# Patient Record
Sex: Female | Born: 1955
Health system: Southern US, Community
[De-identification: ages and names within clinical notes are randomized; demographics above are authoritative.]

## PROBLEM LIST (undated history)

## (undated) DIAGNOSIS — I10 Essential (primary) hypertension: Secondary | ICD-10-CM

## (undated) DIAGNOSIS — F32A Depression, unspecified: Secondary | ICD-10-CM

## (undated) DIAGNOSIS — M1990 Unspecified osteoarthritis, unspecified site: Secondary | ICD-10-CM

## (undated) DIAGNOSIS — F419 Anxiety disorder, unspecified: Secondary | ICD-10-CM

## (undated) DIAGNOSIS — G4733 Obstructive sleep apnea (adult) (pediatric): Secondary | ICD-10-CM

## (undated) DIAGNOSIS — J449 Chronic obstructive pulmonary disease, unspecified: Secondary | ICD-10-CM

## (undated) DIAGNOSIS — J302 Other seasonal allergic rhinitis: Secondary | ICD-10-CM

## (undated) DIAGNOSIS — K219 Gastro-esophageal reflux disease without esophagitis: Secondary | ICD-10-CM

## (undated) DIAGNOSIS — J45909 Unspecified asthma, uncomplicated: Secondary | ICD-10-CM

## (undated) HISTORY — DX: Unspecified asthma, uncomplicated: J45.909

## (undated) HISTORY — DX: Obstructive sleep apnea (adult) (pediatric): G47.33

## (undated) HISTORY — DX: Other seasonal allergic rhinitis: J30.2

## (undated) HISTORY — DX: Essential (primary) hypertension: I10

---

## 2001-05-20 ENCOUNTER — Ambulatory Visit (HOSPITAL_COMMUNITY): Admission: RE | Admit: 2001-05-20 | Discharge: 2001-05-20 | Payer: Self-pay | Admitting: Internal Medicine

## 2001-09-27 ENCOUNTER — Ambulatory Visit: Admission: RE | Admit: 2001-09-27 | Discharge: 2001-09-27 | Payer: Self-pay | Admitting: Family Medicine

## 2011-03-10 ENCOUNTER — Encounter: Payer: Self-pay | Admitting: Internal Medicine

## 2014-11-15 DIAGNOSIS — J3089 Other allergic rhinitis: Secondary | ICD-10-CM | POA: Insufficient documentation

## 2014-11-15 DIAGNOSIS — J309 Allergic rhinitis, unspecified: Secondary | ICD-10-CM

## 2014-11-23 ENCOUNTER — Encounter: Payer: Self-pay | Admitting: Allergy and Immunology

## 2014-11-23 ENCOUNTER — Encounter (INDEPENDENT_AMBULATORY_CARE_PROVIDER_SITE_OTHER): Payer: Self-pay

## 2014-11-23 ENCOUNTER — Ambulatory Visit (INDEPENDENT_AMBULATORY_CARE_PROVIDER_SITE_OTHER): Payer: 59 | Admitting: Internal Medicine

## 2014-11-23 ENCOUNTER — Encounter: Payer: Self-pay | Admitting: Internal Medicine

## 2014-11-23 ENCOUNTER — Ambulatory Visit (INDEPENDENT_AMBULATORY_CARE_PROVIDER_SITE_OTHER): Payer: 59 | Admitting: Allergy and Immunology

## 2014-11-23 VITALS — BP 138/80 | HR 76 | Resp 16

## 2014-11-23 VITALS — BP 164/70 | HR 87 | Ht 62.5 in | Wt 207.8 lb

## 2014-11-23 DIAGNOSIS — R058 Other specified cough: Secondary | ICD-10-CM | POA: Insufficient documentation

## 2014-11-23 DIAGNOSIS — Z87891 Personal history of nicotine dependence: Secondary | ICD-10-CM

## 2014-11-23 DIAGNOSIS — R0689 Other abnormalities of breathing: Secondary | ICD-10-CM | POA: Diagnosis not present

## 2014-11-23 DIAGNOSIS — J4540 Moderate persistent asthma, uncomplicated: Secondary | ICD-10-CM | POA: Diagnosis not present

## 2014-11-23 DIAGNOSIS — R0600 Dyspnea, unspecified: Secondary | ICD-10-CM | POA: Diagnosis not present

## 2014-11-23 DIAGNOSIS — R05 Cough: Secondary | ICD-10-CM

## 2014-11-23 DIAGNOSIS — Z720 Tobacco use: Secondary | ICD-10-CM | POA: Diagnosis not present

## 2014-11-23 DIAGNOSIS — J309 Allergic rhinitis, unspecified: Secondary | ICD-10-CM

## 2014-11-23 LAB — NITRIC OXIDE: NITRIC OXIDE: 17

## 2014-11-23 MED ORDER — MOMETASONE FURO-FORMOTEROL FUM 200-5 MCG/ACT IN AERO: 2.0000 | INHALATION_SPRAY | Freq: Two times a day (BID) | RESPIRATORY_TRACT | Status: DC

## 2014-11-23 MED ORDER — IPRATROPIUM BROMIDE 0.02 % IN SOLN
0.5000 mg | Freq: Once | RESPIRATORY_TRACT | Status: AC
  Administered 2014-11-23: 0.5 mg via RESPIRATORY_TRACT

## 2014-11-23 MED ORDER — LEVALBUTEROL HCL 1.25 MG/0.5ML IN NEBU
1.2500 mg | INHALATION_SOLUTION | Freq: Once | RESPIRATORY_TRACT | Status: AC
  Administered 2014-11-23: 1.25 mg via RESPIRATORY_TRACT

## 2014-11-23 MED ORDER — AZELASTINE-FLUTICASONE 137-50 MCG/ACT NA SUSP: 1.0000 | Freq: Two times a day (BID) | NASAL | Status: DC

## 2014-11-23 NOTE — Progress Notes (Deleted)
Dear ***:  I had the pleasure of seeing Alicia Hall in initial evaluation today.  As you recall {he/she/they:23295} is 59 y.o.  and presents with ***.  After review of {Desc; his/her:32168} history, examination and testing my impression and recommendations are:  Assessment and plan: Problem List Items Addressed This Visit      Respiratory   Allergic rhinitis - Primary   Relevant Medications   Azelastine-Fluticasone (DYMISTA) 137-50 MCG/ACT SUSP   Moderate persistent asthma   Relevant Medications   mometasone-formoterol (DULERA) 200-5 MCG/ACT AERO   levalbuterol (XOPENEX) nebulizer solution 1.25 mg (Completed)   ipratropium (ATROVENT) nebulizer solution 0.5 mg (Completed)   Other Relevant Orders   Spirometry with Graph      History of present illness: No problems updated.  Review of systems: ROS  Past medical history: Past Medical History  Diagnosis Date  . Hypertension   . Asthma   . Seasonal allergies   . OSA (obstructive sleep apnea)     on CPAP    Past surgical history: No past surgical history on file.  Family history: Family History  Problem Relation Age of Onset  . Emphysema Father   . Emphysema Mother   . Lung cancer Maternal Grandfather   . Heart attack Maternal Grandmother     Social history:  Social History   Social History  . Marital Status: Married    Spouse Name: N/A  . Number of Children: N/A  . Years of Education: N/A   Occupational History  . house wife    Social History Main Topics  . Smoking status: Former Smoker -- 1.50 packs/day for 25 years    Types: Cigarettes  . Smokeless tobacco: Not on file  . Alcohol Use: 0.0 oz/week    0 Standard drinks or equivalent per week     Comment: rarely  . Drug Use: No  . Sexual Activity: Not on file   Other Topics Concern  . Not on file   Social History Narrative    Environmental History: {environmental history:[phone]}   Known medication allergies: Allergies  Allergen  Reactions  . Lisinopril Cough    Outpatient medications:   Medication List       This list is accurate as of: 11/23/14  4:25 PM.  Always use your most recent med list.               amLODipine 10 MG tablet  Commonly known as:  NORVASC  Take 10 mg by mouth daily.     Azelastine-Fluticasone 137-50 MCG/ACT Susp  Commonly known as:  DYMISTA  Place 1 spray into the nose 2 (two) times daily.     cetirizine 10 MG tablet  Commonly known as:  ZYRTEC  Take 10 mg by mouth daily.     Fish Oil 1000 MG Caps  Take 2 capsules by mouth daily.     losartan-hydrochlorothiazide 100-12.5 MG tablet  Commonly known as:  HYZAAR  Take 1 tablet by mouth daily.     mometasone-formoterol 200-5 MCG/ACT Aero  Commonly known as:  DULERA  Inhale 2 puffs into the lungs 2 (two) times daily at 10 AM and 5 PM.     montelukast 10 MG tablet  Commonly known as:  SINGULAIR  Take 10 mg by mouth daily.     multivitamin with minerals tablet  Take 1 tablet by mouth daily.     norethindrone 5 MG tablet  Commonly known as:  AYGESTIN  Take 2.5 mg by mouth daily.  omeprazole 20 MG capsule  Commonly known as:  PRILOSEC  Take 20 mg by mouth 2 (two) times daily before a meal.     ranitidine 150 MG tablet  Commonly known as:  ZANTAC  Take 150 mg by mouth 2 (two) times daily.     Red Yeast Rice 600 MG Caps  Take 2 capsules by mouth daily.     sertraline 100 MG tablet  Commonly known as:  ZOLOFT  Take 100 mg by mouth daily.     SPIRIVA HANDIHALER IN  Inhale 1 puff into the lungs daily.     VENTOLIN HFA IN  Inhale into the lungs.     albuterol (2.5 MG/3ML) 0.083% nebulizer solution  Commonly known as:  PROVENTIL  Take 2.5 mg by nebulization every 6 (six) hours as needed for wheezing or shortness of breath.        Physical examination: Blood pressure 138/80, pulse 76, resp. rate 16, SpO2 95 %.  Physical Exam    Diagnostics: {Allergy studies:[phone]}     Shadiamond will ***.  Additional recommendations will be made at follow-up.  Thank you very much for this referral.   Please contact me at your earliest convenience with any questions or concerns.  Sincerely,   Roselyn M. Ishmael Holter, MD

## 2014-11-23 NOTE — Addendum Note (Signed)
Addended by: Maurice March on: 11/23/2014 05:30 PM   Modules accepted: Orders

## 2014-11-23 NOTE — Patient Instructions (Signed)
ICD-9-CM ICD-10-CM   1. Dyspnea and respiratory abnormality 786.09 R06.00     R06.89   2. Smoking history V15.82 Z72.0   3. Upper airway cough syndrome 786.2 R05    Do HRCT chest Do full PFT Refer ENT rule out upper airway problems  followup  - after completing all of above -  - til then continue meds without change

## 2014-11-23 NOTE — Progress Notes (Signed)
History of present illness: Clarabell returns to the office in follow-up of recent cough and wheeze.  She reports she has improved from visit on (11/06/2014) but not 100%.  She has noted occasional cough with wheeze and when active shortness of breath.  She is using albuterol a few times a day but much less since completing Prednisone.  Her sleep is otherwise is okay but occasionally has noted hoarseness without sore throat, fever or headache.  She feels Spiriva is helpful and interested in what Pulmonology will say as her appointment is today.  Outpatient medications:   Medication List       This list is accurate as of: 11/23/14  4:29 PM.  Always use your most recent med list.               amLODipine 10 MG tablet  Commonly known as:  NORVASC  Take 10 mg by mouth daily.     Azelastine-Fluticasone 137-50 MCG/ACT Susp  Commonly known as:  DYMISTA  Place 1 spray into the nose 2 (two) times daily.     cetirizine 10 MG tablet  Commonly known as:  ZYRTEC  Take 10 mg by mouth daily.     Fish Oil 1000 MG Caps  Take 2 capsules by mouth daily.     losartan-hydrochlorothiazide 100-12.5 MG tablet  Commonly known as:  HYZAAR  Take 1 tablet by mouth daily.     mometasone-formoterol 200-5 MCG/ACT Aero  Commonly known as:  DULERA  Inhale 2 puffs into the lungs 2 (two) times daily at 10 AM and 5 PM.     montelukast 10 MG tablet  Commonly known as:  SINGULAIR  Take 10 mg by mouth daily.     multivitamin with minerals tablet  Take 1 tablet by mouth daily.     norethindrone 5 MG tablet  Commonly known as:  AYGESTIN  Take 2.5 mg by mouth daily.     omeprazole 20 MG capsule  Commonly known as:  PRILOSEC  Take 20 mg by mouth 2 (two) times daily before a meal.     ranitidine 150 MG tablet  Commonly known as:  ZANTAC  Take 150 mg by mouth 2 (two) times daily.     Red Yeast Rice 600 MG Caps  Take 2 capsules by mouth daily.     sertraline 100 MG tablet  Commonly known as:  ZOLOFT  Take  100 mg by mouth daily.     SPIRIVA HANDIHALER IN  Inhale 1 puff into the lungs daily.     VENTOLIN HFA IN  Inhale into the lungs.     albuterol (2.5 MG/3ML) 0.083% nebulizer solution  Commonly known as:  PROVENTIL  Take 2.5 mg by nebulization every 6 (six) hours as needed for wheezing or shortness of breath.        Known medication allergies: Allergies  Allergen Reactions  . Lisinopril Cough    Physical examination: Blood pressure 138/80, pulse 76, resp. rate 16, SpO2 95 %.  General: Alert, interactive, in no acute distress. HEENT: TMs pearly gray, normal nasal mucosa, post-pharynx without erythema, exudate or lesions. Neck: Supple without lymphadenopath Lungs: Scattered wheeze without rhonchi or crackles; post Xopenex/Atrovent neb=significant improvement with clear breath sound throughout--patient reports improved. CV: Normal S1, S2 without murmurs. Skin: Warm and dry, without lesions or rashes.  Diagnostics:  FVC 1.81--66%; FEV1--1.06--47.  Postbronchodilator improvement------  FVC 1.81--66%, FEV1 1.15--51%.     Assessment: 1.  History of Asthma with recent exacerbation with probable component  of COPD. 2.  Allergic Rhinitis, improved control. 3.  Recent completion of Prednisone with partial improvement. 4.  Upcoming Pulmonary appointment.   Plan: 1.  Ammi indicates that she has appointment with Dr. Chase Caller today and therefore will defer additional management to his office given her improvement in the last 2 weeks s/p Prednisone and maintenance Dulera and Spiriva. 2. Continue Dymista, Singulair and Cetirizine as previously. 3. Follow-up in 6 months or sooner if needed.    Roselyn M. Ishmael Holter, MD

## 2014-11-23 NOTE — Progress Notes (Signed)
Subjective:    Patient ID: Alicia Hall, female    DOB: 14-Jun-1955, 59 y.o.   MRN: [id number]  PCP .Gar Ponto, MD  HPI  IOV 11/23/2014  Chief Complaint  Patient presents with  . pulmonary consult    pt. states she has SOB. chest sore at times. pt. denies chest tightness, prod. cough clear in color.   59 year old follow-up heavy smoker referred by Dr. Ishmael Holter. History is provided by the patient and review of the outside chart. As best as I can gather patient has a long-standing history of chronic cough, wheezing, shortness of breath. Shortness of breath is the dominant symptom. This is been going on for several years. According to the patient's brother 2 years ago she was extremely sick with the symptoms that family thought she might die. Year 2015 was about a year. But again in the year 2016 symptoms have persisted and are getting worse slowly. The particular he was in the last several months. A year ago she started seeing Dr. Ishmael Holter. According to the patient's history Symbicort was changed to [facility] a year ago but did not help. Then somewhere along the way Spiriva was added. But she still has symptoms. She describes symptoms getting worse with heat and humidity and with exertion but also relieved by rest and by albuterol nebulizers. There is one outside note 09/13/2014 from Dr. Ishmael Holter that suggest that the patient history is likely accurate.  Currently for the last few months have asthma control questionnaire shows significant amount of symptoms. At night she wakes up a few times. On waking up in the morning she is quite severe symptoms which she thinks is because of asthma. She is also very limited in her activities because of asthma. She describes a great deal of shortness of breath because of asthma. And is wheezing most of the time because of asthma and is using albuterol nebulizer for rescue at least 4 times a day. This brings to 5. question an average score to 4 that shows high activity  of symptoms or poor control if she indeed has asthma  Outside notes from Dr. Ishmael Holter  indicate somewhat of a restrictive spirometry with FEV1 1.4 L/62% FVC 1.88 L/68% with a ratio of 91%.  Exhaled nitric oxide today in office 17 ppb and not consistent with eosinophilic airway inflammation  She has a history of sleep apnea managed by a Dr. Redmond Pulling who apparently is no longer in town. Currently no sleep Dr.   has a past medical history of Hypertension; Asthma; Seasonal allergies; and OSA (obstructive sleep apnea).   reports that she has quit smoking. Her smoking use included Cigarettes. She has a 37.5 pack-year smoking history. She does not have any smokeless tobacco history on file.  No past surgical history on file.  Allergies  Allergen Reactions  . Lisinopril Cough    Immunization History  Administered Date(s) Administered  . Tdap 02/23/2014  . Zoster 02/23/2014    Family History  Problem Relation Age of Onset  . Emphysema Father   . Emphysema Mother   . Lung cancer Maternal Grandfather   . Heart attack Maternal Grandmother      Current outpatient prescriptions:  .  albuterol (PROVENTIL) (2.5 MG/3ML) 0.083% nebulizer solution, Take 2.5 mg by nebulization every 6 (six) hours as needed for wheezing or shortness of breath., Disp: , Rfl:  .  Albuterol Sulfate (VENTOLIN HFA IN), Inhale into the lungs., Disp: , Rfl:  .  amLODipine (NORVASC) 10 MG tablet, Take  10 mg by mouth daily., Disp: , Rfl:  .  Azelastine-Fluticasone (DYMISTA) 137-50 MCG/ACT SUSP, Place 1 spray into the nose 2 (two) times daily., Disp: 6 g, Rfl: 0 .  cetirizine (ZYRTEC) 10 MG tablet, Take 10 mg by mouth daily., Disp: , Rfl:  .  losartan-hydrochlorothiazide (HYZAAR) 100-12.5 MG tablet, Take 1 tablet by mouth daily., Disp: , Rfl:  .  mometasone-formoterol (DULERA) 200-5 MCG/ACT AERO, Inhale 2 puffs into the lungs 2 (two) times daily at 10 AM and 5 PM., Disp: 8.8 g, Rfl: 1 .  montelukast (SINGULAIR) 10 MG tablet,  Take 10 mg by mouth daily., Disp: , Rfl:  .  Multiple Vitamins-Minerals (MULTIVITAMIN WITH MINERALS) tablet, Take 1 tablet by mouth daily., Disp: , Rfl:  .  norethindrone (AYGESTIN) 5 MG tablet, Take 2.5 mg by mouth daily., Disp: , Rfl:  .  Omega-3 Fatty Acids (FISH OIL) 1000 MG CAPS, Take 2 capsules by mouth daily., Disp: , Rfl:  .  omeprazole (PRILOSEC) 20 MG capsule, Take 20 mg by mouth 2 (two) times daily before a meal., Disp: , Rfl:  .  ranitidine (ZANTAC) 150 MG tablet, Take 150 mg by mouth 2 (two) times daily., Disp: , Rfl:  .  Red Yeast Rice 600 MG CAPS, Take 2 capsules by mouth daily., Disp: , Rfl:  .  sertraline (ZOLOFT) 100 MG tablet, Take 100 mg by mouth daily., Disp: , Rfl:  .  Tiotropium Bromide Monohydrate (SPIRIVA HANDIHALER IN), Inhale 1 puff into the lungs daily., Disp: , Rfl:   Current facility-administered medications:  .  ipratropium (ATROVENT) nebulizer solution 0.5 mg, 0.5 mg, Nebulization, Once, Roselyn Malachy Moan, MD .  levalbuterol [facility]) nebulizer solution 1.25 mg, 1.25 mg, Nebulization, Once, Roselyn Malachy Moan, MD    Review of Systems  Constitutional: Negative for fever and unexpected weight change.  HENT: Negative for congestion, dental problem, ear pain, nosebleeds, postnasal drip, rhinorrhea, sinus pressure, sneezing, sore throat and trouble swallowing.   Eyes: Negative for redness and itching.  Respiratory: Positive for cough and shortness of breath. Negative for chest tightness and wheezing.   Cardiovascular: Negative for palpitations and leg swelling.  Gastrointestinal: Positive for nausea. Negative for vomiting.  Genitourinary: Negative for dysuria.  Musculoskeletal: Negative for joint swelling.  Skin: Negative for rash.  Neurological: Negative for headaches.  Hematological: Does not bruise/bleed easily.  Psychiatric/Behavioral: Negative for dysphoric mood. The patient is not nervous/anxious.        Objective:   Physical Exam  Constitutional: She is  oriented to person, place, and time. She appears well-developed and well-nourished. No distress.  HENT:  Head: Normocephalic and atraumatic.  Right Ear: External ear normal.  Left Ear: External ear normal.  Mouth/Throat: Oropharynx is clear and moist. No oropharyngeal exudate.  Eyes: Conjunctivae and EOM are normal. Pupils are equal, round, and reactive to light. Right eye exhibits no discharge. Left eye exhibits no discharge. No scleral icterus.  Neck: Normal range of motion. Neck supple. No JVD present. No tracheal deviation present. No thyromegaly present.  Cardiovascular: Normal rate, regular rhythm, normal heart sounds and intact distal pulses.  Exam reveals no gallop and no friction rub.   No murmur heard. Pulmonary/Chest: Effort normal. No respiratory distress. She has wheezes. She has no rales. She exhibits no tenderness.  Mostly inspiratory wheezes that appear forced  Abdominal: Soft. Bowel sounds are normal. She exhibits no distension and no mass. There is no tenderness. There is no rebound and no guarding.  Musculoskeletal: Normal range of  motion. She exhibits no edema or tenderness.  Lymphadenopathy:    She has no cervical adenopathy.  Neurological: She is alert and oriented to person, place, and time. She has normal reflexes. No cranial nerve deficit. She exhibits normal muscle tone. Coordination normal.  Skin: Skin is warm and dry. No rash noted. She is not diaphoretic. No erythema. No pallor.  Psychiatric: She has a normal mood and affect. Her behavior is normal. Judgment and thought content normal.  Vitals reviewed.   Filed Vitals:   11/23/14 1554  BP: 164/70  Pulse: 87  Height: 5' 2.5" (1.588 m)  Weight: 207 lb 12.8 oz (94.257 kg)  SpO2: 96%         Assessment & Plan:     ICD-9-CM ICD-10-CM   1. Dyspnea and respiratory abnormality 786.09 R06.00     R06.89   2. Smoking history V15.82 Z72.0   3. Upper airway cough syndrome 786.2 R05    She has a lot of  symptoms of cough, shortness of breath, wheezing. On exam she has forced wheezing. The symptoms are out of proportion to her exam nitric oxide which is normal. Therefore even if she does have asthma she is completely over perceiving her symptoms. Also spirometry suggests restriction which could be due to interstitial lung disease or obesity. At this point in time we need to rule out a secondary disorder to explain her excessive symptoms. This will include ruling out interstitial lung disease with a high-resolution CT chest. We'll also involve referring to ENT to have a look at her upper airways. We'll also get a full pulmonary function test  Follow-up after the above   Dr. Brand Males, M.D., [facility].C.P Pulmonary and Critical Care Medicine Staff Physician [facility] Pager: [phone], If no answer or between  15:00h - 7:00h: call [phone]  11/23/2014 4:33 PM

## 2014-11-28 ENCOUNTER — Ambulatory Visit (HOSPITAL_COMMUNITY): Admission: RE | Admit: 2014-11-28 | Payer: 59 | Source: Ambulatory Visit

## 2014-11-30 ENCOUNTER — Ambulatory Visit (HOSPITAL_COMMUNITY)
Admission: RE | Admit: 2014-11-30 | Discharge: 2014-11-30 | Disposition: A | Discharge status: Home / Self Care | Payer: 59 | Source: Ambulatory Visit | Attending: Internal Medicine | Admitting: Internal Medicine

## 2014-11-30 ENCOUNTER — Telehealth: Payer: Self-pay | Admitting: Internal Medicine

## 2014-11-30 DIAGNOSIS — Z87891 Personal history of nicotine dependence: Secondary | ICD-10-CM | POA: Insufficient documentation

## 2014-11-30 DIAGNOSIS — R918 Other nonspecific abnormal finding of lung field: Secondary | ICD-10-CM | POA: Insufficient documentation

## 2014-11-30 DIAGNOSIS — R05 Cough: Secondary | ICD-10-CM

## 2014-11-30 DIAGNOSIS — I2510 Atherosclerotic heart disease of native coronary artery without angina pectoris: Secondary | ICD-10-CM | POA: Diagnosis not present

## 2014-11-30 DIAGNOSIS — J479 Bronchiectasis, uncomplicated: Secondary | ICD-10-CM | POA: Insufficient documentation

## 2014-11-30 DIAGNOSIS — J432 Centrilobular emphysema: Secondary | ICD-10-CM | POA: Insufficient documentation

## 2014-11-30 DIAGNOSIS — R0602 Shortness of breath: Secondary | ICD-10-CM | POA: Diagnosis not present

## 2014-11-30 DIAGNOSIS — J45909 Unspecified asthma, uncomplicated: Secondary | ICD-10-CM | POA: Insufficient documentation

## 2014-11-30 DIAGNOSIS — R0689 Other abnormalities of breathing: Secondary | ICD-10-CM

## 2014-11-30 DIAGNOSIS — R0600 Dyspnea, unspecified: Secondary | ICD-10-CM

## 2014-11-30 DIAGNOSIS — R058 Other specified cough: Secondary | ICD-10-CM

## 2014-11-30 LAB — PULMONARY FUNCTION TEST
DL/VA % PRED: 107 %
DL/VA: 4.95 ml/min/mmHg/L
DLCO unc % pred: 74 %
DLCO unc: 16.62 ml/min/mmHg
FEF 25-75 POST: 1.14 L/s
FEF 25-75 Pre: 0.59 L/sec
FEF2575-%CHANGE-POST: 92 %
FEF2575-%PRED-POST: 51 %
FEF2575-%Pred-Pre: 26 %
FEV1-%CHANGE-POST: 27 %
FEV1-%PRED-PRE: 41 %
FEV1-%Pred-Post: 53 %
FEV1-Post: 1.27 L
FEV1-Pre: 1 L
FEV1FVC-%CHANGE-POST: 0 %
FEV1FVC-%PRED-PRE: 85 %
FEV6-%Change-Post: 27 %
FEV6-%Pred-Post: 63 %
FEV6-%Pred-Pre: 49 %
FEV6-POST: 1.89 L
FEV6-Pre: 1.48 L
FEV6FVC-%Change-Post: 0 %
FEV6FVC-%Pred-Post: 104 %
FEV6FVC-%Pred-Pre: 103 %
FVC-%Change-Post: 27 %
FVC-%PRED-POST: 61 %
FVC-%PRED-PRE: 47 %
FVC-POST: 1.89 L
FVC-PRE: 1.49 L
POST FEV1/FVC RATIO: 67 %
PRE FEV6/FVC RATIO: 100 %
Post FEV6/FVC ratio: 100 %
Pre FEV1/FVC ratio: 67 %
RV % pred: 157 %
RV: 3.04 L
TLC % pred: 100 %
TLC: 4.86 L

## 2014-11-30 MED ORDER — ALBUTEROL SULFATE (2.5 MG/3ML) 0.083% IN NEBU
2.5000 mg | INHALATION_SOLUTION | Freq: Once | RESPIRATORY_TRACT | Status: AC
  Administered 2014-11-30: 2.5 mg via RESPIRATORY_TRACT

## 2014-11-30 NOTE — Telephone Encounter (Signed)
Can see TP to review results

## 2014-11-30 NOTE — Telephone Encounter (Signed)
Several findings on CT. Ensure she finishes PFT today and returns for OV   IMPRESSION: 1. Mild centrilobular and paraseptal emphysema with diffuse bronchial wall thickening, which suggests COPD. 2. Mild cylindrical bronchiectasis with associated mild tree-in-bud opacity, peripheral reticulation and patchy ground-glass attenuation in the anterior upper lobes. This distribution raises the possibility of a chronic infectious bronchiolitis such as due to atypical mycobacterial infection (MAI). 3. Scattered tiny pulmonary nodules, largest 4 mm. Follow-up chest CT at 1 year is recommended. This recommendation follows the consensus statement: Guidelines for Management of Small Pulmonary Nodules Detected on CT Scans: A Statement from the [employer] as published in Radiology 2005; 237:395-400. 4. Atherosclerosis, including three-vessel coronary artery disease. Please note that although the presence of coronary artery calcium documents the presence of coronary artery disease, the severity of this disease and any potential stenosis cannot be assessed on this non-gated CT examination. Assessment for potential risk factor modification, dietary therapy or pharmacologic therapy may be warranted, if clinically indicated.   Electronically Signed  By: Ilona Sorrel M.D.  On: 11/30/2014 10:44

## 2014-11-30 NOTE — Telephone Encounter (Signed)
Spoke with pt. OV has been scheduled for 12/18/14 at 9:15am per the pt's request. Nothing further was needed.

## 2014-11-30 NOTE — Telephone Encounter (Signed)
MR, you dont have any open appts until 01/04/15.  Please advise how soon you would like to see pt.

## 2014-12-04 ENCOUNTER — Other Ambulatory Visit: Payer: Self-pay | Admitting: Allergy and Immunology

## 2014-12-04 ENCOUNTER — Telehealth: Payer: Self-pay

## 2014-12-04 NOTE — Telephone Encounter (Signed)
Sample of Spiriva given [place] [id number], VAN#[id number], Expires 09/2015.

## 2014-12-04 NOTE — Telephone Encounter (Signed)
Called patient at 3:05 PM and told her that we have one Spiriva sample and I will leave it upfront for pick up.  Patient voiced understood.

## 2014-12-04 NOTE — Telephone Encounter (Signed)
Patient called requesting a sample of Spiriva. Also she wanted to inform the Nurses and Dr Ishmael Holter that she went to [facility] on Friday. She had a CT Scan of her lungs and a PFT, she is waiting for the results to come back. Dr. Juanell Fairly is also referring her to Dr. Benjamine Mola.  Please Adivse  THanks

## 2014-12-05 ENCOUNTER — Other Ambulatory Visit: Payer: Self-pay | Admitting: Allergy and Immunology

## 2014-12-05 MED ORDER — TIOTROPIUM BROMIDE MONOHYDRATE 1.25 MCG/ACT IN AERS: 1.0000 | INHALATION_SPRAY | Freq: Every day | RESPIRATORY_TRACT | Status: DC

## 2014-12-18 ENCOUNTER — Ambulatory Visit (INDEPENDENT_AMBULATORY_CARE_PROVIDER_SITE_OTHER): Payer: 59 | Admitting: Adult Health

## 2014-12-18 ENCOUNTER — Other Ambulatory Visit (INDEPENDENT_AMBULATORY_CARE_PROVIDER_SITE_OTHER): Payer: 59

## 2014-12-18 ENCOUNTER — Encounter: Payer: Self-pay | Admitting: Adult Health

## 2014-12-18 VITALS — HR 71 | Ht 62.5 in | Wt 206.0 lb

## 2014-12-18 DIAGNOSIS — J449 Chronic obstructive pulmonary disease, unspecified: Secondary | ICD-10-CM | POA: Diagnosis not present

## 2014-12-18 DIAGNOSIS — R058 Other specified cough: Secondary | ICD-10-CM

## 2014-12-18 DIAGNOSIS — R05 Cough: Secondary | ICD-10-CM | POA: Diagnosis not present

## 2014-12-18 DIAGNOSIS — R0600 Dyspnea, unspecified: Secondary | ICD-10-CM

## 2014-12-18 DIAGNOSIS — R911 Solitary pulmonary nodule: Secondary | ICD-10-CM

## 2014-12-18 DIAGNOSIS — R0689 Other abnormalities of breathing: Secondary | ICD-10-CM

## 2014-12-18 DIAGNOSIS — Z23 Encounter for immunization: Secondary | ICD-10-CM | POA: Diagnosis not present

## 2014-12-18 LAB — BASIC METABOLIC PANEL
BUN: 9 mg/dL (ref 6–23)
CALCIUM: 10 mg/dL (ref 8.4–10.5)
CHLORIDE: 103 meq/L (ref 96–112)
CO2: 32 mEq/L (ref 19–32)
CREATININE: 0.66 mg/dL (ref 0.40–1.20)
GFR: 96.77 mL/min (ref >60.00)
Glucose, Bld: 96 mg/dL (ref 70–99)
Potassium: 3.5 mEq/L (ref 3.5–5.1)
Sodium: 143 mEq/L (ref 135–145)

## 2014-12-18 NOTE — Assessment & Plan Note (Signed)
Continue on cough, prevention regimen If flareups persist, consider sputum for AFB.

## 2014-12-18 NOTE — Assessment & Plan Note (Signed)
Suspect her dyspnea is multifactorial with underlying moderate to severe COPD with an asthmatic component. She also is morbidly obese with obvious deconditioning. Have suggested she discuss with her primary care physician regarding atherosclerosis was noted on her CT scan. She may need further evaluation. Check BNP today, although she does not appear to have any volume overload.  Refer to pulmonary rehabilitation

## 2014-12-18 NOTE — Assessment & Plan Note (Signed)
Moderate COPD with an asthmatic component Evidenced by reversibility on PFT CT does show emphysematous changes along with mild bronchiectasis. If patient continues to have recurrent cough or flares. Consider consider sputum for AFB .  Would continue on Spiriva and Dulera.  Control for cough. Triggers such as reflux and rhinitis. She appears to be on a good regimen. Could consider changing Hyzaar as there is some isolated incidences been reported of losartan contributed to cough. Avoid nonselective beta blockers. We'll check for alpha 1 antitrypsin deficiency. Patient does appear to have a component of deconditioning. Will refer to pulmonary rehabilitation  Plan  Referral to pulmonary rehab.  Follow up Dr. Chase Caller in 2-3 months and As needed   Flu shot today .  Discussed Pneumovax on return

## 2014-12-18 NOTE — Progress Notes (Signed)
Subjective:    Patient ID: Alicia Hall, female    DOB: Sep 17, 1955, 59 y.o.   MRN: [id number]  HPI 59 yo former smoker seen for pulmonary consult on 11/23/14 with Dr. Chase Caller for dyspnea.   12/18/2014 Follow up : COPD /Asthma  Pt presents for a follow up for COPD ./Asthma   Pt was seen for pulmonary consult 1 month ago .  CT chest on on October 7 showed mild emphysema with diffuse bronchial wall thickening, mild bronchiectasis with some mild tree-in-bud opacities. There were some scattered tiny pulmonary nodules, the largest at 4 mm. There was also some noted. Atherosclerosis, PFT on 11/30/14 showed post BD FEV1 53%, ratio 67, +BD ressponse, FVC 61%, DLCO 74.  I reviewed these results with the patient and her husband. She is currently on [place]. Currently she feels her breathing is at baseline. She has an ongoing dry cough. She does get short of breath with activity. Shortness of breath seems to be worse first thing in the morning when she gets up. Patient does have a family history of COPD. Patient's BMI is 37.  Exhaled nitric oxide test. Last visit was only 61. She is maintained on Prilosec and Zantac for possible reflux She is on Zyrtec and dymista  nasal spray for postnasal drip. Says that she was on lisinopril about a year ago and this was change, which did help her cough some. He was also changed off of atenolol but helps slightly. She is now on Hyzaar. Patient denies any hemoptysis, chest pain, orthopnea, PND, or increased leg swelling. She has no known heart disease.   Review of Systems Constitutional:   No  weight loss, night sweats,  Fevers, chills,  +fatigue, or  lassitude.  HEENT:   No headaches,  Difficulty swallowing,  Tooth/dental problems, or  Sore throat,                No sneezing, itching, ear ache,  +nasal congestion, post nasal drip,   CV:  No chest pain,  Orthopnea, PND, swelling in lower extremities, anasarca, dizziness, palpitations, syncope.     GI  No heartburn, indigestion, abdominal pain, nausea, vomiting, diarrhea, change in bowel habits, loss of appetite, bloody stools.   Resp:  No chest wall deformity  Skin: no rash or lesions.  GU: no dysuria, change in color of urine, no urgency or frequency.  No flank pain, no hematuria   MS:  No joint pain or swelling.  No decreased range of motion.  No back pain.  Psych:  No change in mood or affect. No depression or anxiety.  No memory loss.         Objective:   Physical Exam  GEN: A/Ox3; pleasant , NAD, morbidly obese Vital signs reviewed  HEENT:  /AT,  EACs-clear, TMs-wnl, NOSE-clear, THROAT-clear, no lesions, no postnasal drip or exudate noted.   NECK:  Supple w/ fair ROM; no JVD; normal carotid impulses w/o bruits; no thyromegaly or nodules palpated; no lymphadenopathy.  RESP  diminished breath sounds in the bases no accessory muscle use, no dullness to percussion  CARD:  RRR, no m/r/g  , no peripheral edema, pulses intact, no cyanosis or clubbing.  GI:   Soft & nt; nml bowel sounds; no organomegaly or masses detected.  Musco: Warm bil, no deformities or joint swelling noted.   Neuro: alert, no focal deficits noted.    Skin: Warm, no lesions or rashes        Assessment &  Plan:

## 2014-12-18 NOTE — Patient Instructions (Signed)
We are setting up for a CT chest in 1year to follow lung nodules.  Discuss with family doctor regarding possible Coronary artery vessel disease seen on CT .  Labs today  Referral to pulmonary rehab.  Follow up Dr. Chase Caller in 2-3 months and As needed   Flu shot today .

## 2014-12-20 ENCOUNTER — Telehealth: Payer: Self-pay | Admitting: Adult Health

## 2014-12-20 NOTE — Telephone Encounter (Signed)
Spoke with pt and advised that lab results are not back yet.  Will call when results available.

## 2014-12-21 NOTE — Progress Notes (Signed)
Quick Note:  Called and spoke with lab, we are unable to add BNP. Attempted to call pt to ask if she could come back to the lab for a BNP. Line was busy.. ______

## 2014-12-25 ENCOUNTER — Other Ambulatory Visit: Payer: Self-pay | Admitting: Adult Health

## 2014-12-25 DIAGNOSIS — J4540 Moderate persistent asthma, uncomplicated: Secondary | ICD-10-CM

## 2014-12-25 LAB — ALPHA-1 ANTITRYPSIN PHENOTYPE: A1 ANTITRYPSIN: 138 mg/dL (ref 83–199)

## 2014-12-25 NOTE — Progress Notes (Signed)
Quick Note:  Called and spoke with pt. Explained to her that we need a BNP drawn and that I would place the order and all she would need to do is go to the lab downstairs and we would call her with the results. She voiced understanding and stated that she would discuss it with her husband because he is her transportation. She did agree to having the lab drawn but stated she was unsure what day she could come. She had no further questions. BNP order was placed. Nothing further needed. ______

## 2014-12-26 ENCOUNTER — Telehealth: Payer: Self-pay | Admitting: Internal Medicine

## 2014-12-26 ENCOUNTER — Telehealth: Payer: Self-pay | Admitting: Adult Health

## 2014-12-26 DIAGNOSIS — J309 Allergic rhinitis, unspecified: Secondary | ICD-10-CM

## 2014-12-26 NOTE — Telephone Encounter (Signed)
Spoke with Diane. States that MR has to sign the order form for pulmonary rehab, TP can't. She is going to refax this form to [place]. A PFT was found in the chart, she no longer needs this. Will await the fax.

## 2014-12-26 NOTE — Telephone Encounter (Signed)
Spoke with pt, states the resulting agency for her labwork on Friday needs to be changed to either [facility] or [facility].  This has been changed.  Nothing further needed.

## 2014-12-26 NOTE — Telephone Encounter (Signed)
error 

## 2014-12-27 NOTE — Telephone Encounter (Signed)
Daneil Dan was this form received? Checked MR's box and it's not there. Thanks.

## 2014-12-27 NOTE — Telephone Encounter (Signed)
I have not received this. Called and left message for Diane to re-fax form.

## 2014-12-28 ENCOUNTER — Other Ambulatory Visit: Payer: 59

## 2014-12-28 DIAGNOSIS — J309 Allergic rhinitis, unspecified: Secondary | ICD-10-CM

## 2014-12-28 NOTE — Telephone Encounter (Signed)
Alicia Hall has form. Will forward message over to her

## 2014-12-28 NOTE — Telephone Encounter (Signed)
Received form and placed in MR's look at's. Once the form is sign I will fax back over to pulmonary rehab. Will sign off.

## 2014-12-31 LAB — BRAIN NATRIURETIC PEPTIDE

## 2015-01-04 ENCOUNTER — Telehealth: Payer: Self-pay | Admitting: Internal Medicine

## 2015-01-04 NOTE — Telephone Encounter (Signed)
Spoke with [name]. Advised her that I spoke with Alicia Hall, MR's nurse. She thinks he has this in his look at folder. Once it's signed we will fax it back. She verbalized understanding. Nothing further was needed at this time.

## 2015-01-08 ENCOUNTER — Ambulatory Visit (INDEPENDENT_AMBULATORY_CARE_PROVIDER_SITE_OTHER): Payer: 59 | Admitting: Allergy and Immunology

## 2015-01-08 ENCOUNTER — Encounter: Payer: Self-pay | Admitting: Internal Medicine

## 2015-01-08 ENCOUNTER — Encounter: Payer: Self-pay | Admitting: Allergy and Immunology

## 2015-01-08 VITALS — BP 138/72 | HR 78 | Temp 98.0°F | Resp 18

## 2015-01-08 DIAGNOSIS — J4540 Moderate persistent asthma, uncomplicated: Secondary | ICD-10-CM | POA: Diagnosis not present

## 2015-01-08 NOTE — Progress Notes (Signed)
FOLLOW UP NOTE  RE: Alicia Hall MRN: [id number] DOB: [birth year] [facility] [place] [address] [place], [place]  [address] Date of Office Visit: 01/08/2015  Subjective:  Alicia Hall is a 59 y.o. female who presents today for Cough and Follow-up  Assessment:   1. Moderate persistent asthma.  2.      COPD/mild emphysema per pulmonology. 3.      Allergic rhinitis. 4.      History of GEReflux on dual therapy. Plan:  1.   Finna will continue Zyrtec, Dulera, Spiriva and Dymista daily. 2.   Keep follow-up with Dr. Chase Caller and further studies per their instruction. 3.   Review sleep apnea/CPAP with Dr. Annamaria Boots. 4.   Follow-up in March 2017 or sooner if needed.  HPI: Alicia Hall returns to the office in follow-up of asthma and recurring respiratory symptoms.  She reports being very pleased that we sent her to Pulmonology and they agree with mediation management and report COPD, therefore working towards pulmonary rehab.  She has received influenza and Prevnar vaccine.  She reports her breathing is good, much better and no albuterol neb in nearly a week.  She has several upcoming studies per pulmonology and saw Dr.Teoh, ENT--she understands normal Vocal cords with slight post-pharynx swelling.  They are considering a sleep study via Dr. Annamaria Boots.  She denies current cough, sneezing, rhinorrhea, congestion, shortness of breath, wheeze or other new concerns.  She is requesting Spiriva prescription.  Current Medications: 1.  Zyrtec 10mg  once daily. 2.  Dulera 254mcg 2 puffs twice daily. 3.  Spiriva 1 puff daily. 4.  Dymista 1 spray twice daily. 5.  Singulair 10mg  once daily. 6.  Ventolin or Albuterol neb as needed. 7.  Continues Amlodipine, Hyzaar, Zoloft, Ranitidine, Prilosec, fish oil, Multivit, Red yeast rice and Norethindrone.   Drug Allergies: Allergies  Allergen Reactions  . Levaquin [Levofloxacin]     nausea  . Lisinopril Cough     Objective:   Filed Vitals:   01/08/15 1542  BP: 138/72  Pulse: 78  Temp: 98 F (36.7 C)  Resp: 18   Physical Exam  Constitutional: She is well-developed, well-nourished, and in no distress.  HENT:  Head: Atraumatic.  Right Ear: Tympanic membrane and ear canal normal.  Left Ear: Tympanic membrane and ear canal normal.  Nose: Mucosal edema present. No rhinorrhea. No epistaxis.  Mouth/Throat: Oropharynx is clear and moist and mucous membranes are normal. No oropharyngeal exudate, posterior oropharyngeal edema or posterior oropharyngeal erythema.  Neck: Neck supple.  Cardiovascular: Normal rate, S1 normal and S2 normal.   No murmur heard. Pulmonary/Chest: Effort normal. She has no wheezes. She has no rhonchi. She has no rales.  Lymphadenopathy:    She has no cervical adenopathy.    Diagnostics: Spirometry: FVC  2.36--86%, FEV1 1.54--70%.    Amarii Alicia M. Ishmael Holter, MD

## 2015-01-24 ENCOUNTER — Encounter: Payer: Self-pay | Admitting: Adult Health

## 2015-01-25 ENCOUNTER — Encounter: Payer: Self-pay | Admitting: Nurse Practitioner

## 2015-01-25 ENCOUNTER — Other Ambulatory Visit: Payer: Self-pay

## 2015-01-25 ENCOUNTER — Ambulatory Visit (INDEPENDENT_AMBULATORY_CARE_PROVIDER_SITE_OTHER): Payer: 59 | Admitting: Nurse Practitioner

## 2015-01-25 VITALS — BP 162/66 | HR 61 | Temp 97.1°F | Ht 62.0 in | Wt 196.6 lb

## 2015-01-25 DIAGNOSIS — R1310 Dysphagia, unspecified: Secondary | ICD-10-CM | POA: Diagnosis not present

## 2015-01-25 DIAGNOSIS — K219 Gastro-esophageal reflux disease without esophagitis: Secondary | ICD-10-CM | POA: Diagnosis not present

## 2015-01-25 DIAGNOSIS — Z1211 Encounter for screening for malignant neoplasm of colon: Secondary | ICD-10-CM

## 2015-01-25 MED ORDER — PEG 3350-KCL-NA BICARB-NACL 420 G PO SOLR: 4000.0000 mL | Freq: Once | ORAL | Status: DC

## 2015-01-25 NOTE — Progress Notes (Signed)
Primary Care Physician:  Gar Ponto, MD Primary Gastroenterologist:  Dr. Gala Romney  Chief Complaint  Patient presents with  . Gastroesophageal Reflux    HPI:   59 year old female presents on referral from PCP for GERD, consideration of endoscopy and screening colonoscopy. PCP notes reviewed, ENT notes reviewed. Patient is a chronic history of GERD and is on omeprazole and ranitidine twice a day. She is having worsening GERD symptoms which were severe and daily, worse at night. ENT evaluation with a flexible fiberoptic laryngoscopy found patient hoarseness likely result of laryngeal reflux and a recommended GI referral for further evaluation. They did note patient has been hoarse since September and quit smoking in 1998. Last colonoscopy completed in 2003 at age 32 for intermittent hematochezia. Findings included a adequate prep, 5 mm x 3 mm flat sessile polyp in the splenic flexure, adjacent 5 mm sessile polyp which were too flat to remove by snare and were destroyed by cold biopsies and snare cautery. Also noted anal canal hemorrhoids.Pathology report found polyps to be hyperplastic. Repeat colonoscopy at her was sent to the patient in 2013 which does not appear to been done.  Today she states she did begin having hoarseness since September. GERD symptoms have been getting worse as well, including occasional esophageal burning, increasing hiccups, episodic nausea without vomiting. Also with frequent throat clearing. Has seen pulmonary medicine who found some asthma, COPD, emphysema but felt GERD could be exacerbating her symptoms. "Cant tell you really" the frequency of breakthrough GERD symptoms. Is also having some dysphagia symptoms with solid foods, no regurgitation, food passes with time. Denies hematochezia, melena, fever chills, unintentional weight loss. Denies chest pain, dyspnea, dizziness, lightheadedness, syncope, near syncope. Denies any other upper or lower GI symptoms.  Past Medical  History  Diagnosis Date  . Hypertension   . Asthma   . Seasonal allergies   . OSA (obstructive sleep apnea)     on CPAP    No past surgical history on file.  Current Outpatient Prescriptions  Medication Sig Dispense Refill  . albuterol (PROVENTIL) (2.5 MG/3ML) 0.083% nebulizer solution Take 2.5 mg by nebulization every 6 (six) hours as needed for wheezing or shortness of breath.    . Albuterol Sulfate (VENTOLIN HFA IN) Inhale into the lungs.    Marland Kitchen amLODipine (NORVASC) 10 MG tablet Take 10 mg by mouth daily.    . Azelastine-Fluticasone (DYMISTA) 137-50 MCG/ACT SUSP Place 1 spray into the nose 2 (two) times daily. 6 g 0  . cetirizine (ZYRTEC) 10 MG tablet Take 10 mg by mouth daily.    Marland Kitchen losartan-hydrochlorothiazide (HYZAAR) 100-12.5 MG tablet Take 1 tablet by mouth daily.    . mometasone-formoterol (DULERA) 200-5 MCG/ACT AERO Inhale 2 puffs into the lungs 2 (two) times daily at 10 AM and 5 PM. 8.8 g 1  . montelukast (SINGULAIR) 10 MG tablet Take 10 mg by mouth daily.    . Multiple Vitamins-Minerals (MULTIVITAMIN WITH MINERALS) tablet Take 1 tablet by mouth daily.    . norethindrone (AYGESTIN) 5 MG tablet Take 2.5 mg by mouth daily.    . Omega-3 Fatty Acids (FISH OIL) 1000 MG CAPS Take 2 capsules by mouth daily.    Marland Kitchen omeprazole (PRILOSEC) 20 MG capsule Take 20 mg by mouth 2 (two) times daily before a meal.    . ranitidine (ZANTAC) 150 MG tablet Take 150 mg by mouth 2 (two) times daily.    . Red Yeast Rice 600 MG CAPS Take 2 capsules by mouth daily.    Marland Kitchen  sertraline (ZOLOFT) 100 MG tablet Take 100 mg by mouth daily.    . Tiotropium Bromide Monohydrate (SPIRIVA HANDIHALER IN) Inhale 1 puff into the lungs daily.     No current facility-administered medications for this visit.    Allergies as of 01/25/2015 - Review Complete 01/25/2015  Allergen Reaction Noted  . Levaquin [levofloxacin]    . Lisinopril Cough 11/15/2014    Family History  Problem Relation Age of Onset  . Emphysema  Father   . Emphysema Mother   . Lung cancer Maternal Grandfather   . Heart attack Maternal Grandmother     Social History   Social History  . Marital Status: Married    Spouse Name: N/A  . Number of Children: N/A  . Years of Education: N/A   Occupational History  . house wife    Social History Main Topics  . Smoking status: Former Smoker -- 1.50 packs/day for 25 years    Types: Cigarettes  . Smokeless tobacco: Not on file  . Alcohol Use: 0.0 oz/week    0 Standard drinks or equivalent per week     Comment: rarely  . Drug Use: No  . Sexual Activity: Not on file   Other Topics Concern  . Not on file   Social History Narrative    Review of Systems: General: Negative for anorexia, weight loss, fever, chills, fatigue, weakness. Eyes: Negative for vision changes.  ENT: Admits hoarseness, difficulty swallowing. CV: Negative for chest pain, angina, palpitations, dyspnea on exertion, peripheral edema.  Respiratory: Negative for dyspnea at rest, dyspnea on exertion, cough, sputum, wheezing.  GI: See history of present illness. Derm: Negative for rash or itching.  Endo: Negative for unusual weight change.  Heme: Negative for bruising or bleeding. Allergy: Negative for rash or hives.    Physical Exam: BP 162/66 mmHg  Pulse 61  Temp(Src) 97.1 F (36.2 C) (Oral)  Ht 5\' 2"  (1.575 m)  Wt 196 lb 9.6 oz (89.177 kg)  BMI 35.95 kg/m2 General:   Alert and oriented. Pleasant and cooperative. Well-nourished and well-developed.  Head:  Normocephalic and atraumatic. Eyes:  Without icterus, sclera clear and conjunctiva pink.  Ears:  Normal auditory acuity. Cardiovascular:  S1, S2 present without murmurs appreciated. Extremities without clubbing or edema. Respiratory:  Clear to auscultation bilaterally. No wheezes, rales, or rhonchi. No distress.  Gastrointestinal:  +BS, rounded but soft, mild epigastric TTP and non-distended. No HSM noted. No guarding or rebound. No masses  appreciated.  Rectal:  Deferred  Skin:  Intact without significant lesions or rashes. Neurologic:  Alert and oriented x4;  grossly normal neurologically. Psych:  Alert and cooperative. Normal mood and affect.    01/25/2015 11:04 AM

## 2015-01-25 NOTE — Patient Instructions (Signed)
1. We will schedule your procedure for you. 2. Keep taking your acid blocker twice a day as you have been. 3. Return for follow-up in 3 months.

## 2015-01-28 ENCOUNTER — Encounter: Payer: Self-pay | Admitting: Internal Medicine

## 2015-01-28 ENCOUNTER — Other Ambulatory Visit: Payer: Self-pay

## 2015-01-28 NOTE — Progress Notes (Signed)
cc'ed to pcp °

## 2015-01-28 NOTE — Assessment & Plan Note (Signed)
Patient overdue for repeat surveillance colonoscopy as noted in history of present illness. At this point we'll proceed with colonoscopy in addition to her endoscopy as noted above. Return for follow-up in 3 months.  Proceed with TCS with Dr. Gala Romney in near future: the risks, benefits, and alternatives have been discussed with the patient in detail. The patient states understanding and desires to proceed.  The patient is not on any anticoagulants, chronic pain medications, antidepressants, or anxiolytics. She notes a poor sedation experience with inadequate sedation and a prior procedure. This point we'll add Phenergan 25 mg to promote adequate sedation.

## 2015-01-28 NOTE — Assessment & Plan Note (Signed)
Patient with persistent GERD symptoms, worsening night. Is having hoarseness for the past couple months. Evaluated by ENT and pulmonary. ENT evaluation included flexible fiberoptic laryngoscopy which found hoarseness likely due to laryngeal reflux and recommended GI evaluation. Also with esophageal burning, hiccups, episodic nausea, frequent throat clearing. At this point we'll proceed with an endoscopy with possible dilation due to dysphagia symptoms as well as noted below. Continue twice a day PPI. Return for follow-up in 3 months.

## 2015-01-28 NOTE — Assessment & Plan Note (Addendum)
Patient with solid food dysphagia symptoms as noted in history of present illness. Also with worsening GERD symptoms. Likely due to reflux esophagitis, cannot rule out stricture, web, ring. This point we'll proceed with an EGD with possible dilation in addition to the colonoscopy as noted below. We'll plan on Phenergan preprocedure due to poor prior sedation for procedure. Continue twice a day PPI, return for follow-up in 3 months.  Proceed with EGD +/- dilation with Dr. Gala Romney with 25mg  preprocedure phenergan in near future: the risks, benefits, and alternatives have been discussed with the patient in detail. The patient states understanding and desires to proceed.  The patient is not on any anticoagulants, chronic pain medications, antidepressants, or anxiolytics. She notes a poor sedation experience with inadequate sedation and a prior procedure. This point we'll add Phenergan 25 mg to promote adequate sedation.

## 2015-01-29 ENCOUNTER — Encounter (HOSPITAL_COMMUNITY): Payer: 59

## 2015-01-30 ENCOUNTER — Telehealth: Payer: Self-pay

## 2015-01-30 NOTE — Telephone Encounter (Signed)
[employer] [facility] PA for EGD/TCS  [id number]

## 2015-01-31 ENCOUNTER — Encounter (HOSPITAL_COMMUNITY): Payer: Self-pay | Admitting: *Deleted

## 2015-01-31 ENCOUNTER — Ambulatory Visit (HOSPITAL_COMMUNITY)
Admission: RE | Admit: 2015-01-31 | Discharge: 2015-01-31 | Disposition: A | Discharge status: Home / Self Care | Payer: 59 | Source: Ambulatory Visit | Attending: Internal Medicine | Admitting: Internal Medicine

## 2015-01-31 ENCOUNTER — Encounter (HOSPITAL_COMMUNITY)
Admission: RE | Disposition: A | Discharge status: Home / Self Care | Payer: Self-pay | Source: Ambulatory Visit | Attending: Internal Medicine

## 2015-01-31 DIAGNOSIS — Z79899 Other long term (current) drug therapy: Secondary | ICD-10-CM | POA: Diagnosis not present

## 2015-01-31 DIAGNOSIS — J45909 Unspecified asthma, uncomplicated: Secondary | ICD-10-CM | POA: Diagnosis not present

## 2015-01-31 DIAGNOSIS — Z1211 Encounter for screening for malignant neoplasm of colon: Secondary | ICD-10-CM | POA: Diagnosis not present

## 2015-01-31 DIAGNOSIS — K219 Gastro-esophageal reflux disease without esophagitis: Secondary | ICD-10-CM

## 2015-01-31 DIAGNOSIS — I10 Essential (primary) hypertension: Secondary | ICD-10-CM | POA: Diagnosis not present

## 2015-01-31 DIAGNOSIS — Z87891 Personal history of nicotine dependence: Secondary | ICD-10-CM | POA: Insufficient documentation

## 2015-01-31 DIAGNOSIS — K210 Gastro-esophageal reflux disease with esophagitis: Secondary | ICD-10-CM | POA: Diagnosis not present

## 2015-01-31 DIAGNOSIS — R490 Dysphonia: Secondary | ICD-10-CM | POA: Insufficient documentation

## 2015-01-31 DIAGNOSIS — G4733 Obstructive sleep apnea (adult) (pediatric): Secondary | ICD-10-CM | POA: Insufficient documentation

## 2015-01-31 DIAGNOSIS — D124 Benign neoplasm of descending colon: Secondary | ICD-10-CM | POA: Diagnosis not present

## 2015-01-31 DIAGNOSIS — Z86010 Personal history of colonic polyps: Secondary | ICD-10-CM

## 2015-01-31 DIAGNOSIS — K635 Polyp of colon: Secondary | ICD-10-CM | POA: Diagnosis not present

## 2015-01-31 DIAGNOSIS — R1310 Dysphagia, unspecified: Secondary | ICD-10-CM | POA: Diagnosis not present

## 2015-01-31 HISTORY — PX: ESOPHAGOGASTRODUODENOSCOPY: SHX5428

## 2015-01-31 HISTORY — PX: COLONOSCOPY: SHX5424

## 2015-01-31 HISTORY — DX: Gastro-esophageal reflux disease without esophagitis: K21.9

## 2015-01-31 HISTORY — DX: Chronic obstructive pulmonary disease, unspecified: J44.9

## 2015-01-31 HISTORY — PX: MALONEY DILATION: SHX5535

## 2015-01-31 SURGERY — COLONOSCOPY
Anesthesia: Moderate Sedation

## 2015-01-31 MED ORDER — MEPERIDINE HCL 100 MG/ML IJ SOLN
INTRAMUSCULAR | Status: AC
  Filled 2015-01-31: qty 2

## 2015-01-31 MED ORDER — PROMETHAZINE HCL 25 MG/ML IJ SOLN
25.0000 mg | Freq: Once | INTRAMUSCULAR | Status: AC
  Administered 2015-01-31: 25 mg via INTRAVENOUS

## 2015-01-31 MED ORDER — MIDAZOLAM HCL 5 MG/5ML IJ SOLN
INTRAMUSCULAR | Status: DC | PRN
  Administered 2015-01-31 (×2): 2 mg via INTRAVENOUS

## 2015-01-31 MED ORDER — SODIUM CHLORIDE 0.9 % IV SOLN
INTRAVENOUS | Status: DC
  Administered 2015-01-31: 08:00:00 via INTRAVENOUS

## 2015-01-31 MED ORDER — MIDAZOLAM HCL 5 MG/5ML IJ SOLN
INTRAMUSCULAR | Status: AC
  Filled 2015-01-31: qty 10

## 2015-01-31 MED ORDER — MEPERIDINE HCL 100 MG/ML IJ SOLN
INTRAMUSCULAR | Status: DC | PRN
  Administered 2015-01-31 (×2): 50 mg via INTRAVENOUS

## 2015-01-31 MED ORDER — SODIUM CHLORIDE 0.9 % IJ SOLN
INTRAMUSCULAR | Status: AC
  Filled 2015-01-31: qty 3

## 2015-01-31 MED ORDER — STERILE WATER FOR IRRIGATION IR SOLN
Status: DC | PRN
  Administered 2015-01-31: 08:00:00

## 2015-01-31 MED ORDER — ONDANSETRON HCL 4 MG/2ML IJ SOLN
INTRAMUSCULAR | Status: DC | PRN
  Administered 2015-01-31: 4 mg via INTRAVENOUS

## 2015-01-31 MED ORDER — LIDOCAINE VISCOUS 2 % MT SOLN
OROMUCOSAL | Status: DC | PRN
  Administered 2015-01-31: 1 via OROMUCOSAL

## 2015-01-31 MED ORDER — ONDANSETRON HCL 4 MG/2ML IJ SOLN
INTRAMUSCULAR | Status: AC
  Filled 2015-01-31: qty 2

## 2015-01-31 MED ORDER — PROMETHAZINE HCL 25 MG/ML IJ SOLN
INTRAMUSCULAR | Status: AC
  Filled 2015-01-31: qty 1

## 2015-01-31 MED ORDER — LIDOCAINE VISCOUS 2 % MT SOLN
OROMUCOSAL | Status: AC
  Filled 2015-01-31: qty 15

## 2015-01-31 NOTE — H&P (View-Only) (Signed)
Primary Care Physician:  Gar Ponto, MD Primary Gastroenterologist:  Dr. Gala Romney  Chief Complaint  Patient presents with  . Gastroesophageal Reflux    HPI:   59 year old female presents on referral from PCP for GERD, consideration of endoscopy and screening colonoscopy. PCP notes reviewed, ENT notes reviewed. Patient is a chronic history of GERD and is on omeprazole and ranitidine twice a day. She is having worsening GERD symptoms which were severe and daily, worse at night. ENT evaluation with a flexible fiberoptic laryngoscopy found patient hoarseness likely result of laryngeal reflux and a recommended GI referral for further evaluation. They did note patient has been hoarse since September and quit smoking in 1998. Last colonoscopy completed in 2003 at age 61 for intermittent hematochezia. Findings included a adequate prep, 5 mm x 3 mm flat sessile polyp in the splenic flexure, adjacent 5 mm sessile polyp which were too flat to remove by snare and were destroyed by cold biopsies and snare cautery. Also noted anal canal hemorrhoids.Pathology report found polyps to be hyperplastic. Repeat colonoscopy at her was sent to the patient in 2013 which does not appear to been done.  Today she states she did begin having hoarseness since September. GERD symptoms have been getting worse as well, including occasional esophageal burning, increasing hiccups, episodic nausea without vomiting. Also with frequent throat clearing. Has seen pulmonary medicine who found some asthma, COPD, emphysema but felt GERD could be exacerbating her symptoms. "Cant tell you really" the frequency of breakthrough GERD symptoms. Is also having some dysphagia symptoms with solid foods, no regurgitation, food passes with time. Denies hematochezia, melena, fever chills, unintentional weight loss. Denies chest pain, dyspnea, dizziness, lightheadedness, syncope, near syncope. Denies any other upper or lower GI symptoms.  Past Medical  History  Diagnosis Date  . Hypertension   . Asthma   . Seasonal allergies   . OSA (obstructive sleep apnea)     on CPAP    No past surgical history on file.  Current Outpatient Prescriptions  Medication Sig Dispense Refill  . albuterol (PROVENTIL) (2.5 MG/3ML) 0.083% nebulizer solution Take 2.5 mg by nebulization every 6 (six) hours as needed for wheezing or shortness of breath.    . Albuterol Sulfate (VENTOLIN HFA IN) Inhale into the lungs.    Marland Kitchen amLODipine (NORVASC) 10 MG tablet Take 10 mg by mouth daily.    . Azelastine-Fluticasone (DYMISTA) 137-50 MCG/ACT SUSP Place 1 spray into the nose 2 (two) times daily. 6 g 0  . cetirizine (ZYRTEC) 10 MG tablet Take 10 mg by mouth daily.    Marland Kitchen losartan-hydrochlorothiazide (HYZAAR) 100-12.5 MG tablet Take 1 tablet by mouth daily.    . mometasone-formoterol (DULERA) 200-5 MCG/ACT AERO Inhale 2 puffs into the lungs 2 (two) times daily at 10 AM and 5 PM. 8.8 g 1  . montelukast (SINGULAIR) 10 MG tablet Take 10 mg by mouth daily.    . Multiple Vitamins-Minerals (MULTIVITAMIN WITH MINERALS) tablet Take 1 tablet by mouth daily.    . norethindrone (AYGESTIN) 5 MG tablet Take 2.5 mg by mouth daily.    . Omega-3 Fatty Acids (FISH OIL) 1000 MG CAPS Take 2 capsules by mouth daily.    Marland Kitchen omeprazole (PRILOSEC) 20 MG capsule Take 20 mg by mouth 2 (two) times daily before a meal.    . ranitidine (ZANTAC) 150 MG tablet Take 150 mg by mouth 2 (two) times daily.    . Red Yeast Rice 600 MG CAPS Take 2 capsules by mouth daily.    Marland Kitchen  sertraline (ZOLOFT) 100 MG tablet Take 100 mg by mouth daily.    . Tiotropium Bromide Monohydrate (SPIRIVA HANDIHALER IN) Inhale 1 puff into the lungs daily.     No current facility-administered medications for this visit.    Allergies as of 01/25/2015 - Review Complete 01/25/2015  Allergen Reaction Noted  . Levaquin [levofloxacin]    . Lisinopril Cough 11/15/2014    Family History  Problem Relation Age of Onset  . Emphysema  Father   . Emphysema Mother   . Lung cancer Maternal Grandfather   . Heart attack Maternal Grandmother     Social History   Social History  . Marital Status: Married    Spouse Name: N/A  . Number of Children: N/A  . Years of Education: N/A   Occupational History  . house wife    Social History Main Topics  . Smoking status: Former Smoker -- 1.50 packs/day for 25 years    Types: Cigarettes  . Smokeless tobacco: Not on file  . Alcohol Use: 0.0 oz/week    0 Standard drinks or equivalent per week     Comment: rarely  . Drug Use: No  . Sexual Activity: Not on file   Other Topics Concern  . Not on file   Social History Narrative    Review of Systems: General: Negative for anorexia, weight loss, fever, chills, fatigue, weakness. Eyes: Negative for vision changes.  ENT: Admits hoarseness, difficulty swallowing. CV: Negative for chest pain, angina, palpitations, dyspnea on exertion, peripheral edema.  Respiratory: Negative for dyspnea at rest, dyspnea on exertion, cough, sputum, wheezing.  GI: See history of present illness. Derm: Negative for rash or itching.  Endo: Negative for unusual weight change.  Heme: Negative for bruising or bleeding. Allergy: Negative for rash or hives.    Physical Exam: BP 162/66 mmHg  Pulse 61  Temp(Src) 97.1 F (36.2 C) (Oral)  Ht 5\' 2"  (1.575 m)  Wt 196 lb 9.6 oz (89.177 kg)  BMI 35.95 kg/m2 General:   Alert and oriented. Pleasant and cooperative. Well-nourished and well-developed.  Head:  Normocephalic and atraumatic. Eyes:  Without icterus, sclera clear and conjunctiva pink.  Ears:  Normal auditory acuity. Cardiovascular:  S1, S2 present without murmurs appreciated. Extremities without clubbing or edema. Respiratory:  Clear to auscultation bilaterally. No wheezes, rales, or rhonchi. No distress.  Gastrointestinal:  +BS, rounded but soft, mild epigastric TTP and non-distended. No HSM noted. No guarding or rebound. No masses  appreciated.  Rectal:  Deferred  Skin:  Intact without significant lesions or rashes. Neurologic:  Alert and oriented x4;  grossly normal neurologically. Psych:  Alert and cooperative. Normal mood and affect.    01/25/2015 11:04 AM

## 2015-01-31 NOTE — Discharge Instructions (Addendum)
Colonoscopy Discharge Instructions  Read the instructions outlined below and refer to this sheet in the next few weeks. These discharge instructions provide you with general information on caring for yourself after you leave the hospital. Your doctor may also give you specific instructions. While your treatment has been planned according to the most current medical practices available, unavoidable complications occasionally occur. If you have any problems or questions after discharge, call Dr. Gala Romney at [phone]. ACTIVITY  You may resume your regular activity, but move at a slower pace for the next 24 hours.   Take frequent rest periods for the next 24 hours.   Walking will help get rid of the air and reduce the bloated feeling in your belly (abdomen).   No driving for 24 hours (because of the medicine (anesthesia) used during the test).    Do not sign any important legal documents or operate any machinery for 24 hours (because of the anesthesia used during the test).  NUTRITION  Drink plenty of fluids.   You may resume your normal diet as instructed by your doctor.   Begin with a light meal and progress to your normal diet. Heavy or fried foods are harder to digest and may make you feel sick to your stomach (nauseated).   Avoid alcoholic beverages for 24 hours or as instructed.  MEDICATIONS  You may resume your normal medications unless your doctor tells you otherwise.  WHAT YOU CAN EXPECT TODAY  Some feelings of bloating in the abdomen.   Passage of more gas than usual.   Spotting of blood in your stool or on the toilet paper.  IF YOU HAD POLYPS REMOVED DURING THE COLONOSCOPY:  No aspirin products for 7 days or as instructed.   No alcohol for 7 days or as instructed.   Eat a soft diet for the next 24 hours.  FINDING OUT THE RESULTS OF YOUR TEST Not all test results are available during your visit. If your test results are not back during the visit, make an appointment  with your caregiver to find out the results. Do not assume everything is normal if you have not heard from your caregiver or the medical facility. It is important for you to follow up on all of your test results.  SEEK IMMEDIATE MEDICAL ATTENTION IF:  You have more than a spotting of blood in your stool.   Your belly is swollen (abdominal distention).   You are nauseated or vomiting.   You have a temperature over 101.  You have abdominal pain or discomfort that is severe or gets worse throughout the day. EGD Discharge instructions Please read the instructions outlined below and refer to this sheet in the next few weeks. These discharge instructions provide you with general information on caring for yourself after you leave the hospital. Your doctor may also give you specific instructions. While your treatment has been planned according to the most current medical practices available, unavoidable complications occasionally occur. If you have any problems or questions after discharge, please call your doctor. ACTIVITY You may resume your regular activity but move at a slower pace for the next 24 hours.  Take frequent rest periods for the next 24 hours.  Walking will help expel (get rid of) the air and reduce the bloated feeling in your abdomen.  No driving for 24 hours (because of the anesthesia (medicine) used during the test).  You may shower.  Do not sign any important legal documents or operate any machinery for 24  hours (because of the anesthesia used during the test).  NUTRITION Drink plenty of fluids.  You may resume your normal diet.  Begin with a light meal and progress to your normal diet.  Avoid alcoholic beverages for 24 hours or as instructed by your caregiver.  MEDICATIONS You may resume your normal medications unless your caregiver tells you otherwise.  WHAT YOU CAN EXPECT TODAY You may experience abdominal discomfort such as a feeling of fullness or gas pains.   FOLLOW-UP Your doctor will discuss the results of your test with you.  SEEK IMMEDIATE MEDICAL ATTENTION IF ANY OF THE FOLLOWING OCCUR: Excessive nausea (feeling sick to your stomach) and/or vomiting.  Severe abdominal pain and distention (swelling).  Trouble swallowing.  Temperature over 101 F (37.8 C).  Rectal bleeding or vomiting of blood.    Stop Prilosec; begin Dexilant 60 mg daily-go by my office for bag or free samples  GERD and polyp information provided  Further recommendations to follow pending review of pathology report  Gastroesophageal Reflux Disease, Adult Normally, food travels down the esophagus and stays in the stomach to be digested. However, when a person has gastroesophageal reflux disease (GERD), food and stomach acid move back up into the esophagus. When this happens, the esophagus becomes sore and inflamed. Over time, GERD can create small holes (ulcers) in the lining of the esophagus.  CAUSES This condition is caused by a problem with the muscle between the esophagus and the stomach (lower esophageal sphincter, or LES). Normally, the LES muscle closes after food passes through the esophagus to the stomach. When the LES is weakened or abnormal, it does not close properly, and that allows food and stomach acid to go back up into the esophagus. The LES can be weakened by certain dietary substances, medicines, and medical conditions, including:  Tobacco use.  Pregnancy.  Having a hiatal hernia.  Heavy alcohol use.  Certain foods and beverages, such as coffee, chocolate, onions, and peppermint. RISK FACTORS This condition is more likely to develop in:  People who have an increased body weight.  People who have connective tissue disorders.  People who use NSAID medicines. SYMPTOMS Symptoms of this condition include:  Heartburn.  Difficult or painful swallowing.  The feeling of having a lump in the throat.  Abitter taste in the mouth.  Bad  breath.  Having a large amount of saliva.  Having an upset or bloated stomach.  Belching.  Chest pain.  Shortness of breath or wheezing.  Ongoing (chronic) cough or a night-time cough.  Wearing away of tooth enamel.  Weight loss. Different conditions can cause chest pain. Make sure to see your health care provider if you experience chest pain. DIAGNOSIS Your health care provider will take a medical history and perform a physical exam. To determine if you have mild or severe GERD, your health care provider may also monitor how you respond to treatment. You may also have other tests, including:  An endoscopy toexamine your stomach and esophagus with a small camera.  A test thatmeasures the acidity level in your esophagus.  A test thatmeasures how much pressure is on your esophagus.  A barium swallow or modified barium swallow to show the shape, size, and functioning of your esophagus. TREATMENT The goal of treatment is to help relieve your symptoms and to prevent complications. Treatment for this condition may vary depending on how severe your symptoms are. Your health care provider may recommend:  Changes to your diet.  Medicine.  Surgery. HOME  CARE INSTRUCTIONS Diet  Follow a diet as recommended by your health care provider. This may involve avoiding foods and drinks such as:  Coffee and tea (with or without caffeine).  Drinks that containalcohol.  Energy drinks and sports drinks.  Carbonated drinks or sodas.  Chocolate and cocoa.  Peppermint and mint flavorings.  Garlic and onions.  Horseradish.  Spicy and acidic foods, including peppers, chili powder, curry powder, vinegar, hot sauces, and barbecue sauce.  Citrus fruit juices and citrus fruits, such as oranges, lemons, and limes.  Tomato-based foods, such as red sauce, chili, salsa, and pizza with red sauce.  Fried and fatty foods, such as donuts, french fries, potato chips, and high-fat  dressings.  High-fat meats, such as hot dogs and fatty cuts of red and white meats, such as rib eye steak, sausage, ham, and bacon.  High-fat dairy items, such as whole milk, butter, and cream cheese.  Eat small, frequent meals instead of large meals.  Avoid drinking large amounts of liquid with your meals.  Avoid eating meals during the 2-3 hours before bedtime.  Avoid lying down right after you eat.  Do not exercise right after you eat. General Instructions  Pay attention to any changes in your symptoms.  Take over-the-counter and prescription medicines only as told by your health care provider. Do not take aspirin, ibuprofen, or other NSAIDs unless your health care provider told you to do so.  Do not use any tobacco products, including cigarettes, chewing tobacco, and e-cigarettes. If you need help quitting, ask your health care provider.  Wear loose-fitting clothing. Do not wear anything tight around your waist that causes pressure on your abdomen.  Raise (elevate) the head of your bed 6 inches (15cm).  Try to reduce your stress, such as with yoga or meditation. If you need help reducing stress, ask your health care provider.  If you are overweight, reduce your weight to an amount that is healthy for you. Ask your health care provider for guidance about a safe weight loss goal.  Keep all follow-up visits as told by your health care provider. This is important. SEEK MEDICAL CARE IF:  You have new symptoms.  You have unexplained weight loss.  You have difficulty swallowing, or it hurts to swallow.  You have wheezing or a persistent cough.  Your symptoms do not improve with treatment.  You have a hoarse voice. SEEK IMMEDIATE MEDICAL CARE IF:  You have pain in your arms, neck, jaw, teeth, or back.  You feel sweaty, dizzy, or light-headed.  You have chest pain or shortness of breath.  You vomit and your vomit looks like blood or coffee grounds.  You  faint.  Your stool is bloody or black.  You cannot swallow, drink, or eat.   This information is not intended to replace advice given to you by your health care provider. Make sure you discuss any questions you have with your health care provider.   Document Released: 11/19/2004 Document Revised: 10/31/2014 Document Reviewed: 06/06/2014 Elsevier Interactive Patient Education 2016 Elsevier Inc.   Colon Polyps Polyps are lumps of extra tissue growing inside the body. Polyps can grow in the large intestine (colon). Most colon polyps are noncancerous (benign). However, some colon polyps can become cancerous over time. Polyps that are larger than a pea may be harmful. To be safe, caregivers remove and test all polyps. CAUSES  Polyps form when mutations in the genes cause your cells to grow and divide even though no more tissue  is needed. RISK FACTORS There are a number of risk factors that can increase your chances of getting colon polyps. They include:  Being older than 50 years.  Family history of colon polyps or colon cancer.  Long-term colon diseases, such as colitis or Crohn disease.  Being overweight.  Smoking.  Being inactive.  Drinking too much alcohol. SYMPTOMS  Most small polyps do not cause symptoms. If symptoms are present, they may include:  Blood in the stool. The stool may look dark red or black.  Constipation or diarrhea that lasts longer than 1 week. DIAGNOSIS People often do not know they have polyps until their caregiver finds them during a regular checkup. Your caregiver can use 4 tests to check for polyps:  Digital rectal exam. The caregiver wears gloves and feels inside the rectum. This test would find polyps only in the rectum.  Barium enema. The caregiver puts a liquid called barium into your rectum before taking X-rays of your colon. Barium makes your colon look white. Polyps are dark, so they are easy to see in the X-ray pictures.  Sigmoidoscopy. A  thin, flexible tube (sigmoidoscope) is placed into your rectum. The sigmoidoscope has a light and tiny camera in it. The caregiver uses the sigmoidoscope to look at the last third of your colon.  Colonoscopy. This test is like sigmoidoscopy, but the caregiver looks at the entire colon. This is the most common method for finding and removing polyps. TREATMENT  Any polyps will be removed during a sigmoidoscopy or colonoscopy. The polyps are then tested for cancer. PREVENTION  To help lower your risk of getting more colon polyps:  Eat plenty of fruits and vegetables. Avoid eating fatty foods.  Do not smoke.  Avoid drinking alcohol.  Exercise every day.  Lose weight if recommended by your caregiver.  Eat plenty of calcium and folate. Foods that are rich in calcium include milk, cheese, and broccoli. Foods that are rich in folate include chickpeas, kidney beans, and spinach. HOME CARE INSTRUCTIONS Keep all follow-up appointments as directed by your caregiver. You may need periodic exams to check for polyps. SEEK MEDICAL CARE IF: You notice bleeding during a bowel movement.   This information is not intended to replace advice given to you by your health care provider. Make sure you discuss any questions you have with your health care provider.   Document Released: 11/06/2003 Document Revised: 03/02/2014 Document Reviewed: 04/21/2011 Elsevier Interactive Patient Education [employer].

## 2015-01-31 NOTE — Interval H&P Note (Signed)
History and Physical Interval Note:  01/31/2015 8:32 AM  Alicia Hall  has presented today for surgery, with the diagnosis of GERD, dysphagia, Screening colonoscopy  The various methods of treatment have been discussed with the patient and family. After consideration of risks, benefits and other options for treatment, the patient has consented to  Procedure(s) with comments: COLONOSCOPY (N/A) - 0915 ESOPHAGOGASTRODUODENOSCOPY (EGD) (N/A) SAVORY DILATION (N/A) MALONEY DILATION (N/A) as a surgical intervention .  The patient's history has been reviewed, patient examined, no change in status, stable for surgery.  I have reviewed the patient's chart and labs.  Questions were answered to the patient's satisfaction.     Alicia Hall  No change. EGD with ED and colonoscopy per plan.  The risks, benefits, limitations, imponderables and alternatives regarding both EGD and colonoscopy have been reviewed with the patient. Questions have been answered. All parties agreeable.

## 2015-01-31 NOTE — Op Note (Signed)
[facility] [address] [place], [address]   COLONOSCOPY PROCEDURE REPORT  PATIENT: Alicia Hall, Alicia Hall  MR#: [id number] BIRTHDATE: 1955-04-28 , 59  yrs. old GENDER: female ENDOSCOPIST: R.  Garfield Cornea, MD FACP [facility] REFERRED [id number] Quillian Quince, M.D. PROCEDURE DATE:  2015-02-19 PROCEDURE:   Colonoscopy with snare polypectomy and with ablation INDICATIONS:history of colonic adenoma; surveillance examination. MEDICATIONS: Versed 4 mg IV and Demerol 100 mg IV in divided doses. Phenergan 25 mg IV.  Zofran 4 mg IV. ASA CLASS:       Class II  CONSENT: The risks, benefits, alternatives and imponderables including but not limited to bleeding, perforation as well as the possibility of a missed lesion have been reviewed.  The potential for biopsy, lesion removal, etc. have also been discussed. Questions have been answered.  All parties agreeable.  Please see the history and physical in the medical record for more information.  DESCRIPTION OF PROCEDURE:   After the risks benefits and alternatives of the procedure were thoroughly explained, informed consent was obtained.  The digital rectal exam revealed no abnormalities of the rectum.   The [id number]  endoscope was introduced through the anus and advanced to the cecum, which was identified by both the appendix and ileocecal valve. No adverse events experienced.   The quality of the prep was adequate  The instrument was then slowly withdrawn as the colon was fully examined. Estimated blood loss is zero unless otherwise noted in this procedure report.      COLON FINDINGS: Normal appearing rectal mucosa.  Patient had (3) polyps in her mid descending segment and the largest 8 mm and the smallest was diminutive in size.  The remainder of the colonic mucosa appeared normal.  2 of the larger above-mentioned polyps were hot snared.  The smallest was ablated with the tip of a hot snare loop. Retroflexion was  performed. .  Withdrawal time=11 minutes 0 seconds.  The scope was withdrawn and the procedure completed. COMPLICATIONS: There were no immediate complications.  ENDOSCOPIC IMPRESSION: Multiple colonic polyps treated/removed as described above  RECOMMENDATIONS: Follow-up on pathology. See EGD report.  eSigned:  R. Garfield Cornea, MD FACP [facility] February 19, 2015 9:22 AM   cc:  CPT CODES: ICD CODES:  The ICD and CPT codes recommended by this software are interpretations from the data that the clinical staff has captured with the software.  The verification of the translation of this report to the ICD and CPT codes and modifiers is the sole responsibility of the health care institution and practicing physician where this report was generated.  [place]. will not be held responsible for the validity of the ICD and CPT codes included on this report.  AMA assumes no liability for data contained or not contained herein. CPT is a Designer, television/film set of the [employer].

## 2015-01-31 NOTE — Op Note (Signed)
[facility] [address] [place], [address]   ENDOSCOPY PROCEDURE REPORT  PATIENT: Alicia Hall, Alicia Hall  MR#: [id number] BIRTHDATE: [birth year] , 59  yrs. old GENDER: female ENDOSCOPIST: R.  Garfield Cornea, MD FACP [facility] REFERRED BY:  Gar Ponto, M.D. PROCEDURE DATE:  02/05/15 PROCEDURE:  EGD, diagnostic and Maloney dilation of esophagus INDICATIONS:  GERD; vague esophageal dysphagia. MEDICATIONS: Versed 4 mg IV and Demerol 100 mg IV in divided doses. Xylocaine gel orally.  Zofran 4 mg IV. ASA CLASS:      Class II  CONSENT: The risks, benefits, limitations, alternatives and imponderables have been discussed.  The potential for biopsy, esophogeal dilation, etc. have also been reviewed.  Questions have been answered.  All parties agreeable.  Please see the history and physical in the medical record for more information.  DESCRIPTION OF PROCEDURE: After the risks benefits and alternatives of the procedure were thoroughly explained, informed consent was obtained.  The [id number] endoscope was introduced through the mouth and advanced to the second portion of the duodenum , limited by Without limitations. The instrument was slowly withdrawn as the mucosa was fully examined. Estimated blood loss is zero unless otherwise noted in this procedure report.    Focal patchy erythema of the GE junction within 5 mm.  No erosion or ulceration.  No Barrett's epithelium.  The tubular esophagus appeared patent throughout its course.  Stomach empty.  Tiny hiatal hernia.  Normal gastric mucosa.  Patent pylorus.  Normal-appearing first and second portion of the duodenum.  The scope was withdrawn and a 54 [place] Maloney dilator was passed to full insertion with mild to moderate resistance.  A look back revealed no apparent complication related to this maneuver. Retroflexed views revealed as previously described.     The scope was then withdrawn from the patient and the  procedure completed.  COMPLICATIONS: There were no immediate complications.  ENDOSCOPIC IMPRESSION: Mild changes of reflux esophagitis. Tiny hiatal hernia. Status post passage of a Maloney dilator  RECOMMENDATIONS: Stop Prilosec; trial of Dexilant 60 mg daily.   See colonoscopy report.  REPEAT EXAM:  eSigned:  R. Garfield Cornea, MD Rosalita Chessman [facility] February 05, 2015 8:58 AM    CC:  CPT CODES: ICD CODES:  The ICD and CPT codes recommended by this software are interpretations from the data that the clinical staff has captured with the software.  The verification of the translation of this report to the ICD and CPT codes and modifiers is the sole responsibility of the health care institution and practicing physician where this report was generated.  [place]. will not be held responsible for the validity of the ICD and CPT codes included on this report.  AMA assumes no liability for data contained or not contained herein. CPT is a Designer, television/film set of the [employer].  PATIENT NAME:  Alicia Hall, Alicia Hall MR#: [id number]

## 2015-02-01 ENCOUNTER — Encounter: Payer: Self-pay | Admitting: Internal Medicine

## 2015-02-05 ENCOUNTER — Encounter (HOSPITAL_COMMUNITY): Payer: 59

## 2015-02-06 ENCOUNTER — Encounter (HOSPITAL_COMMUNITY): Payer: Self-pay | Admitting: Internal Medicine

## 2015-02-11 ENCOUNTER — Other Ambulatory Visit: Payer: Self-pay | Admitting: Allergy and Immunology

## 2015-02-11 MED ORDER — TIOTROPIUM BROMIDE MONOHYDRATE 18 MCG IN CAPS: 18.0000 ug | ORAL_CAPSULE | Freq: Every day | RESPIRATORY_TRACT | Status: DC

## 2015-02-11 NOTE — Telephone Encounter (Signed)
PT CALL TO GET SAMPLES OF SPIRIVA . BUT WE DONT HAVE ANY SO SHE NEEDS A RX CALLED INTO [facility] IN [place]. [place], [phone] CELL.

## 2015-02-11 NOTE — Telephone Encounter (Signed)
Sent in prescription for Spiriva to patients pharmacy and notified patient.

## 2015-02-19 ENCOUNTER — Other Ambulatory Visit: Payer: Self-pay

## 2015-02-19 NOTE — Telephone Encounter (Signed)
[facility] [phone] INCORRECT SPIRIVA SENT ELECTRONICALLY AT LAST OV.   SPIRIVA RESPIMAT 2.5 MCG (1 PUFF QD) CALLED IN--- #1 WITH 3 REFILLS

## 2015-02-28 ENCOUNTER — Encounter (HOSPITAL_COMMUNITY)
Admission: RE | Admit: 2015-02-28 | Discharge: 2015-02-28 | Disposition: A | Discharge status: Home / Self Care | Payer: BLUE CROSS/BLUE SHIELD | Source: Ambulatory Visit | Attending: Internal Medicine | Admitting: Internal Medicine

## 2015-02-28 ENCOUNTER — Telehealth: Payer: Self-pay | Admitting: Internal Medicine

## 2015-02-28 VITALS — BP 138/60 | HR 72

## 2015-02-28 DIAGNOSIS — J449 Chronic obstructive pulmonary disease, unspecified: Secondary | ICD-10-CM | POA: Insufficient documentation

## 2015-02-28 NOTE — Patient Instructions (Signed)
Pt has finished orientation and is scheduled to start PR on 03/07/15 at 10:45. Pt has been instructed to arrive to class 15 minutes early for scheduled class. Pt has been instructed to wear comfortable clothing and shoes with rubber soles. Pt has been told to take their medications 1 hour prior to coming to class.  If the patient is not going to attend class, he/she has been instructed to call.

## 2015-02-28 NOTE — Telephone Encounter (Signed)
#  4 boxes of dexilant are at the front desk. Please let her know.

## 2015-02-28 NOTE — Telephone Encounter (Signed)
Pt was made aware.  

## 2015-02-28 NOTE — Telephone Encounter (Signed)
PATIENT STATED THAT THE DEXILANT SAMPLES WERE WORKING GOOD AND WANTS MORE SAMPLES.  [place] IS [employer]    [phone]

## 2015-02-28 NOTE — Progress Notes (Signed)
Patient arrived for 1st visit/orientation/education at 0800. Patient was referred to PR by Dr. Chase Caller due to COPd J44.9. During orientation advised patient on arrival and appointment times what to wear, what to do before, during and after exercise. Reviewed attendance and class policy. Talked about inclement weather and class consultation policy. Pt is scheduled to return Pulm Rehab on 03/07/15. Pt was advised to come to class 15 minutes before class starts. She was also given instructions on meeting with the dietician and attending the Family Structure classes. Pt is eager to get started. Patient was able to complete 6 minute walk test. Patient was measured for the equipment. Discussed equipment safety with patient. Took patient pre-anthropometric measurements. Patient scored 1 on PHQ-2 and scored 4 on PHQ-9. Patient does not feel she needs counseling. Patient finished visit at 1055.

## 2015-02-28 NOTE — Progress Notes (Signed)
Cardiac/Pulmonary Rehab Medication Review by a Pharmacist  Does the patient  feel that his/her medications are working for him/her?  yes  Has the patient been experiencing any side effects to the medications prescribed?  no  Does the patient measure his/her own blood pressure or blood glucose at home?  no   Does the patient have any problems obtaining medications due to transportation or finances?   no  Understanding of regimen: good Understanding of indications: good Potential of compliance: good  Questions asked to Determine Patient Understanding of Medication Regimen:  1. What is the name of the medication?  2. What is the medication used for?  3. When should it be taken?  4. How much should be taken?  5. How will you take it?  6. What side effects should you report?  Understanding Defined as: Excellent: All questions above are correct Good: Questions 1-4 are correct Fair: Questions 1-2 are correct  Poor: 1 or none of the above questions are correct   Pharmacist comments: Pt does not report any side effects from medications. Pt does check BP at home at times but not every day.  No issues with blood sugar problems.   Hart Robinsons A 02/28/2015 8:52 AM

## 2015-02-28 NOTE — Outcomes Assessment (Signed)
[facility] Baseline Outcomes Assessment   Anthropometrics:  . Height (inches): 62.5 . Weight (kg): 87.2 . Grip strength was measured using a Dynamometer.  The patient's highest score was a 55.  Functional Status/Exercise Capacity: . Alicia Hall had a resting heart rate of 72 BPM, a resting blood pressure of 138/60, and an oxygen saturation of 95 % on 0 liters of O2.  Alicia Hall performed a 6-minute walk test on 02/28/2015.  The patient completed 1150 feet in 6 minutes with 0 rest breaks.  This quantifies 2.67 METS.   Dyspnea Measures: . The [facility] is a simple and standardized method of classifying disability in patients with COPD.  The assessment correlates disability and dyspnea.  At entrance the patient scored a 3. The scale is provided below.   0= I only get breathless with strenuous exercise. 1= I get short of breath when hurrying on level ground or walking up a slight incline. 2= On level ground, I walk slower than people of the same age because of breathlessness, or have to stop for breath when walking at my own pace. 3= I stop for breath after walking 100 yards or after a few minutes on level ground. 4=I am too breathless to leave the house or I am breathless when dressing.   . The patient completed the [place] ([facility] [place]).  This questionnaire relates activities of daily living and shortness of breath.  The score ranges from 0-120, a higher score relates to severe shortness of breath during activities of daily living. The patient's score at entrance was 43.  Quality of Life: . Ferrans and Powers Quality of Life Index Pulmonary Version is used to assess the patients satisfaction in different domains of their life; health and functioning, socioeconomic, psychological/spiritual, and family. The overall score is recorded out of 30 points.  The patient's goal is to achieve an overall score of 21 or higher.   Alicia Hall received a 24.63 at entrance.  . The Patient Health Questionnaire (PHQ-2) is a first step approach for the screening of depression.  If the patient scores positive on the PHQ-2 the patient should be further assessed with the PHQ-9.  The Patient Health Questionnaire (PHQ-9) assesses the degree of depression.  Depression is important to monitor and track in pulmonary patients due to its prevalence in the population.  If the patient advances to the PHQ-9 the goal is to score less than 4 on this assessment.  Alicia Hall scored a 4 at entrance.  Clinical Assessment Tools: . The COPD Assessment Test (CAT) is a measurement tool to quantify how much of an impact the disease has on the patient's life.  This assessment aids the Pulmonary Rehab Team in designing the patients individualized treatment plan.  A CAT score ranges from 0-40.  A score of 10 or below indicates that COPD has a low impact on the patient's life whereas a score of 30 or higher indicates a severe impact. The patient's goal is a decrease of 1 point from entrance to discharge.  Alicia Hall had a CAT score of 25 at entrance.  Nutrition: . The "Rate My Plate" is a dietary assessment that quantifies the balance of a patient's diet.  This tool allows the Pulmonary Rehab Team to key in on the areas of the patient's diet that needs improving.  The team can then focus their nutritional education on those areas.  If the patient scores 24-40, this means there are many ways  they can make their eating habits healthier, 41-57 states that there are some ways they can make their eating habits healthier and a score of 58-72 states that they are making many healthy choices.  The patient's goal is to achieve a score of 49 or higher on this assessment.  Alicia Hall scored a 73 at entrance.  Oxygen Compliance: . Patient not currently on oxygen.  Alicia Hall is currently using a cpap at night.  The patient is currently compliant.   Education: . Alicia Hall will attend education classes  during the course of Pulmonary Rehab.  Education classes that will be offered to the patient are Activities of Daily Living and Energy Conservation, Pursed Lip Breathing and Diaphragmatic Breathing, Nutrition, Exercise for the Pulmonary Patient, Warning Signs of Infection, Chronic Lung Disease, Advanced Directives, Medications, and Stress and Meditation.  The patient completed an assessment at the entrance of the program and will complete it again upon discharge to demonstrate the level of understanding provided by the educational classes.  This assessment includes 14 questions regarding all of the education topics above.  Alicia Hall achieved a score of 9/14 at entrance.  Smoking Cessation:  N/A  Exercise: Alicia Hall will be provided with an individualized Home Exercise Prescription (HEP) at the entrance of the program.  The patient will be followed by the Pulmonary Exercise Physiologist throughout the program to assist with the progression of the frequency, intensity, time, and type of exercise. The patient's long-term goal is to be exercising 30-60 minutes, 3-5 days per week. At entrance, the patient was exercising 0 days at home.

## 2015-02-28 NOTE — Telephone Encounter (Signed)
Also routing to the refill box for rx

## 2015-03-01 MED ORDER — DEXLANSOPRAZOLE 60 MG PO CPDR: 60.0000 mg | DELAYED_RELEASE_CAPSULE | Freq: Every day | ORAL | Status: DC

## 2015-03-01 NOTE — Addendum Note (Signed)
Addended by: Mahala Menghini on: 03/01/2015 11:03 AM   Modules accepted: Orders

## 2015-03-05 ENCOUNTER — Ambulatory Visit: Payer: 59 | Admitting: Internal Medicine

## 2015-03-05 ENCOUNTER — Encounter (HOSPITAL_COMMUNITY)
Admission: RE | Admit: 2015-03-05 | Discharge: 2015-03-05 | Disposition: A | Discharge status: Home / Self Care | Payer: BLUE CROSS/BLUE SHIELD | Source: Ambulatory Visit | Attending: Internal Medicine | Admitting: Internal Medicine

## 2015-03-05 DIAGNOSIS — J449 Chronic obstructive pulmonary disease, unspecified: Secondary | ICD-10-CM | POA: Diagnosis not present

## 2015-03-07 ENCOUNTER — Encounter (HOSPITAL_COMMUNITY)
Admission: RE | Admit: 2015-03-07 | Discharge: 2015-03-07 | Disposition: A | Discharge status: Home / Self Care | Payer: BLUE CROSS/BLUE SHIELD | Source: Ambulatory Visit | Attending: Internal Medicine | Admitting: Internal Medicine

## 2015-03-07 DIAGNOSIS — J449 Chronic obstructive pulmonary disease, unspecified: Secondary | ICD-10-CM | POA: Diagnosis not present

## 2015-03-08 ENCOUNTER — Ambulatory Visit (INDEPENDENT_AMBULATORY_CARE_PROVIDER_SITE_OTHER): Payer: BLUE CROSS/BLUE SHIELD | Admitting: Internal Medicine

## 2015-03-08 ENCOUNTER — Encounter: Payer: Self-pay | Admitting: Internal Medicine

## 2015-03-08 VITALS — BP 148/70 | HR 65 | Ht 62.0 in | Wt 192.4 lb

## 2015-03-08 DIAGNOSIS — R0689 Other abnormalities of breathing: Secondary | ICD-10-CM

## 2015-03-08 DIAGNOSIS — R0600 Dyspnea, unspecified: Secondary | ICD-10-CM | POA: Diagnosis not present

## 2015-03-08 DIAGNOSIS — R911 Solitary pulmonary nodule: Secondary | ICD-10-CM

## 2015-03-08 NOTE — Patient Instructions (Addendum)
ICD-9-CM ICD-10-CM   1. Dyspnea and respiratory abnormality 786.09 R06.00     R06.89   2. Lung nodule 793.11 R91.1     Complete rehab  folluwpo 3 months - if still symptomatic then will do CPST bike test You should have CT chest in system for oct 2017 for followup nodules - set up pprevious visit by NP

## 2015-03-08 NOTE — Progress Notes (Signed)
Subjective:     Patient ID: Alicia Hall, female   DOB: 1955/04/07, 60 y.o.   MRN: [id number]  HPI   PCP .Gar Ponto, MD  HPI  IOV 11/23/2014  Chief Complaint  Patient presents with  . pulmonary consult    pt. states she has SOB. chest sore at times. pt. denies chest tightness, prod. cough clear in color.   60 year old follow-up heavy smoker referred by Dr. Ishmael Holter. History is provided by the patient and review of the outside chart. As best as I can gather patient has a long-standing history of chronic cough, wheezing, shortness of breath. Shortness of breath is the dominant symptom. This is been going on for several years. According to the patient's brother 2 years ago she was extremely sick with the symptoms that family thought she might die. Year 2015 was about a year. But again in the year 2016 symptoms have persisted and are getting worse slowly. The particular he was in the last several months. A year ago she started seeing Dr. Ishmael Holter. According to the patient's history Symbicort was changed to [facility] a year ago but did not help. Then somewhere along the way Spiriva was added. But she still has symptoms. She describes symptoms getting worse with heat and humidity and with exertion but also relieved by rest and by albuterol nebulizers. There is one outside note 09/13/2014 from Dr. Ishmael Holter that suggest that the patient history is likely accurate.  Currently for the last few months have asthma control questionnaire shows significant amount of symptoms. At night she wakes up a few times. On waking up in the morning she is quite severe symptoms which she thinks is because of asthma. She is also very limited in her activities because of asthma. She describes a great deal of shortness of breath because of asthma. And is wheezing most of the time because of asthma and is using albuterol nebulizer for rescue at least 4 times a day. This brings to 5. question an average score to 4 that shows high  activity of symptoms or poor control if she indeed has asthma  60 yo former smoker seen for pulmonary consult on 11/23/14 with Dr. Chase Caller for dyspnea.   12/18/2014 Follow up : COPD /Asthma  Pt presents for a follow up for COPD ./Asthma   Pt was seen for pulmonary consult 1 month ago .  CT chest on on October 7 showed mild emphysema with diffuse bronchial wall thickening, mild bronchiectasis with some mild tree-in-bud opacities. There were some scattered tiny pulmonary nodules, the largest at 4 mm. There was also some noted. Atherosclerosis, PFT on 11/30/14 showed post BD FEV1 53%, ratio 67, +BD ressponse, FVC 61%, DLCO 74.  I reviewed these results with the patient and her husband. She is currently on [place]. Currently she feels her breathing is at baseline. She has an ongoing dry cough. She does get short of breath with activity. Shortness of breath seems to be worse first thing in the morning when she gets up. Patient does have a family history of COPD. Patient's BMI is 37.  Exhaled nitric oxide test. Last visit was only 17. She is maintained on Prilosec and Zantac for possible reflux She is on Zyrtec and dymista  nasal spray for postnasal drip. Says that she was on lisinopril about a year ago and this was change, which did help her cough some. He was also changed off of atenolol but helps slightly. She is now on Hyzaar. Patient denies  any hemoptysis, chest pain, orthopnea, PND, or increased leg swelling. She has no known heart disease.   OV 03/08/2015  Chief Complaint  Patient presents with  . Follow-up    Pt states her breathing is unchanged since last OV. Pt c/o cough with little mucus production - clear in color. Pt denies CP/tightness.     60 year old obese female with dyspnea out of proportion to obstructive lung disease treatment. She is here for follow-up after see my nurse practitioner in October 2016. This is a follow-up to see effects of pulmonary rehabilitation  on dyspnea. However there was a delayed started pulmonary rehabilitation due to insurance which. She has only completed 2 sessions with pulmonary rehabilitation. She still has 2-3 months ago. She underwent pulmonary rehabilitation at [facility]. Currently she feels stable. There are no new issues. She is complaint with her medications below.   Current outpatient prescriptions:  .  albuterol (PROVENTIL HFA;VENTOLIN HFA) 108 (90 BASE) MCG/ACT inhaler, Inhale 2 puffs into the lungs every 6 (six) hours as needed for wheezing or shortness of breath., Disp: , Rfl:  .  albuterol (PROVENTIL) (2.5 MG/3ML) 0.083% nebulizer solution, Take 2.5 mg by nebulization every 6 (six) hours as needed for wheezing or shortness of breath., Disp: , Rfl:  .  amLODipine (NORVASC) 10 MG tablet, Take 10 mg by mouth daily., Disp: , Rfl:  .  Azelastine-Fluticasone (DYMISTA) 137-50 MCG/ACT SUSP, Place 1 spray into the nose 2 (two) times daily., Disp: 6 g, Rfl: 0 .  cetirizine (ZYRTEC) 10 MG tablet, Take 10 mg by mouth daily., Disp: , Rfl:  .  dexlansoprazole (DEXILANT) 60 MG capsule, Take 1 capsule (60 mg total) by mouth daily., Disp: 30 capsule, Rfl: 11 .  losartan-hydrochlorothiazide (HYZAAR) 100-12.5 MG tablet, Take 1 tablet by mouth daily., Disp: , Rfl:  .  meloxicam (MOBIC) 15 MG tablet, Take 15 mg by mouth daily., Disp: , Rfl:  .  mometasone-formoterol (DULERA) 200-5 MCG/ACT AERO, Inhale 2 puffs into the lungs 2 (two) times daily at 10 AM and 5 PM., Disp: 8.8 g, Rfl: 1 .  montelukast (SINGULAIR) 10 MG tablet, Take 10 mg by mouth daily., Disp: , Rfl:  .  Multiple Vitamins-Minerals (MULTIVITAMIN WITH MINERALS) tablet, Take 1 tablet by mouth daily., Disp: , Rfl:  .  norethindrone (AYGESTIN) 5 MG tablet, Take 2.5 mg by mouth daily., Disp: , Rfl:  .  Omega-3 Fatty Acids (FISH OIL) 1000 MG CAPS, Take 2 capsules by mouth daily., Disp: , Rfl:  .  ranitidine (ZANTAC) 150 MG tablet, Take 150 mg by mouth 2 (two) times daily., Disp: ,  Rfl:  .  Red Yeast Rice 600 MG CAPS, Take 2 capsules by mouth daily., Disp: , Rfl:  .  sertraline (ZOLOFT) 100 MG tablet, Take 100 mg by mouth daily., Disp: , Rfl:  .  tiotropium (SPIRIVA HANDIHALER) 18 MCG inhalation capsule, Place 1 capsule (18 mcg total) into inhaler and inhale daily., Disp: 30 capsule, Rfl: 3   Immunization History  Administered Date(s) Administered  . Influenza,inj,Quad PF,36+ Mos 12/18/2014  . Tdap 02/23/2014  . Zoster 02/23/2014    Allergies  Allergen Reactions  . Levaquin [Levofloxacin]     nausea  . Lisinopril Cough      Review of Systems According to history of present illness     Objective:   Physical Exam Filed Vitals:   03/08/15 0927  BP: 148/70  Pulse: 65  Height: 5\' 2"  (1.575 m)  Weight: 192 lb 6.4 oz (87.272  kg)  SpO2: 96%   Gen. exam: Obese pleasant lady. Body mass index is 35.18 kg/(m^2). Respiratory exam: Clear HEENT: No thrush. Oral cavity is clear. Mallampati class II Cardiovascular: Normal heart sounds regular rate and rhythm Abdomen: Soft Extremity is: No cyanosis no clubbing no pedal edema Skin: Intact in exposed areas       Assessment:       ICD-9-CM ICD-10-CM   1. Dyspnea and respiratory abnormality 786.09 R06.00     R06.89   2. Lung nodule 793.11 R91.1        Plan:     -We discussed and we agreed we will await the effects of pulmonary rehabilitation on her dyspnea. After pulmonary rehabilitation of dyspnea is still present are unimproved or her quality of life is not satisfactory then we will do pulmonary stress test on the bike  - Regarding lung nodule: She is probably set up for an annual CT chest in October 2017.  - We will see her in 3 months   Dr. Brand Males, M.D., [facility].C.P Pulmonary and Critical Care Medicine Staff Physician [facility] Pager: [phone], If no answer or between  15:00h - 7:00h: call [phone]  03/08/2015 10:19 AM

## 2015-03-12 ENCOUNTER — Encounter (HOSPITAL_COMMUNITY): Payer: BLUE CROSS/BLUE SHIELD

## 2015-03-14 ENCOUNTER — Encounter (HOSPITAL_COMMUNITY)
Admission: RE | Admit: 2015-03-14 | Discharge: 2015-03-14 | Disposition: A | Discharge status: Home / Self Care | Payer: BLUE CROSS/BLUE SHIELD | Source: Ambulatory Visit | Attending: Internal Medicine | Admitting: Internal Medicine

## 2015-03-14 DIAGNOSIS — J449 Chronic obstructive pulmonary disease, unspecified: Secondary | ICD-10-CM | POA: Diagnosis not present

## 2015-03-15 NOTE — Progress Notes (Signed)
Pulmonary Rehabilitation Program Outcomes Report   Orientation:  02/28/15 Graduate Date:  tbd Discharge Date:  tbd # of sessions completed: 3  Pulmonologist: Ramaswamy Family MD:  Bland Span Time:  [id number]  A.  Exercise Program:  Tolerates exercise @ 3.59 METS for 15 minutes and Walk Test Results:  Pre: 2.67  B.  Mental Health:  Good mental attitude and PHQ-9: 4. patient stated she does not feel she needs counseling.   C.  Education/Instruction/Skills  Uses Perceived Exertion Scale and/or Dyspnea Scale  Demonstrates accurate pursed lip breathing  D.  Nutrition/Weight Control/Body Composition:  Adherence to prescribed nutrition program: fair    E.  Blood Lipids   No results found for: CHOL, HDL, LDLCALC, LDLDIRECT, TRIG, CHOLHDL  F.  Lifestyle Changes:  Making positive lifestyle changes and Not smoking:  Quit 1997  G.  Symptoms noted with exercise:  Asymptomatic  Report Completed By:  Stevphen Rochester RN   Comments:  This is the patients first week progress note for [facility].

## 2015-03-15 NOTE — Progress Notes (Addendum)
Alicia Hall 60 y.o. female  Initial Psychosocial Assessment  Pt psychosocial assessment reveals pt lives with their spouse. Pt is currently retired. Pt hobbies include reading. Pt reports her stress level is moderate. Areas of stress/anxiety include Family.  Pt does not exhibit signs of depression. Signs of depression include none. Pt shows good  coping skills with positive outlook . Staff offered emotional support and reassurance. Monitor and evaluate progress toward psychosocial goal(s).  Goal(s): Help patient work toward returning to meaningful activities that improve patient's QOL and are attainable with patient's lung disease Lose Weight Breathe better   03/15/2015 2:11 PM

## 2015-03-19 ENCOUNTER — Encounter (HOSPITAL_COMMUNITY)
Admission: RE | Admit: 2015-03-19 | Discharge: 2015-03-19 | Disposition: A | Discharge status: Home / Self Care | Payer: BLUE CROSS/BLUE SHIELD | Source: Ambulatory Visit | Attending: Internal Medicine | Admitting: Internal Medicine

## 2015-03-19 DIAGNOSIS — J449 Chronic obstructive pulmonary disease, unspecified: Secondary | ICD-10-CM | POA: Diagnosis not present

## 2015-03-21 ENCOUNTER — Encounter (HOSPITAL_COMMUNITY)
Admission: RE | Admit: 2015-03-21 | Discharge: 2015-03-21 | Disposition: A | Discharge status: Home / Self Care | Payer: BLUE CROSS/BLUE SHIELD | Source: Ambulatory Visit | Attending: Internal Medicine | Admitting: Internal Medicine

## 2015-03-21 DIAGNOSIS — J449 Chronic obstructive pulmonary disease, unspecified: Secondary | ICD-10-CM | POA: Diagnosis not present

## 2015-03-26 ENCOUNTER — Encounter (HOSPITAL_COMMUNITY)
Admission: RE | Admit: 2015-03-26 | Discharge: 2015-03-26 | Disposition: A | Discharge status: Home / Self Care | Payer: BLUE CROSS/BLUE SHIELD | Source: Ambulatory Visit | Attending: Internal Medicine | Admitting: Internal Medicine

## 2015-03-26 DIAGNOSIS — J449 Chronic obstructive pulmonary disease, unspecified: Secondary | ICD-10-CM | POA: Diagnosis not present

## 2015-03-28 ENCOUNTER — Encounter (HOSPITAL_COMMUNITY)
Admission: RE | Admit: 2015-03-28 | Discharge: 2015-03-28 | Disposition: A | Discharge status: Home / Self Care | Payer: BLUE CROSS/BLUE SHIELD | Source: Ambulatory Visit | Attending: Internal Medicine | Admitting: Internal Medicine

## 2015-03-28 DIAGNOSIS — J449 Chronic obstructive pulmonary disease, unspecified: Secondary | ICD-10-CM | POA: Diagnosis not present

## 2015-04-02 ENCOUNTER — Telehealth: Payer: Self-pay | Admitting: Internal Medicine

## 2015-04-02 ENCOUNTER — Encounter (HOSPITAL_COMMUNITY)
Admission: RE | Admit: 2015-04-02 | Discharge: 2015-04-02 | Disposition: A | Discharge status: Home / Self Care | Payer: BLUE CROSS/BLUE SHIELD | Source: Ambulatory Visit | Attending: Internal Medicine | Admitting: Internal Medicine

## 2015-04-02 DIAGNOSIS — J449 Chronic obstructive pulmonary disease, unspecified: Secondary | ICD-10-CM | POA: Diagnosis not present

## 2015-04-02 NOTE — Telephone Encounter (Signed)
[phone]   Patient wanting samples of [place]

## 2015-04-02 NOTE — Progress Notes (Signed)
Patient was given individual home exercise plan. Handout was reviewed and discussed with patient. Patient's long term goals were reviewed and reassessed. Patient signed home exercise plan and expressed understanding.   

## 2015-04-02 NOTE — Telephone Encounter (Signed)
#  3 boxes of dexilant are at the front desk. 

## 2015-04-02 NOTE — Progress Notes (Signed)
Alicia Hall 60 y.o. female  30 day Psychosocial Note  Patient psychosocial assessment reveals no barriers to participation in Pulmonary Rehab. Psychosocial areas that are currently affecting patient's rehab experience include concerns about family, rating a 5 on stress level. Patient does continue to exhibit positive coping skills to deal with her psychosocial concerns. Offered emotional support and reassurance. Patient does feel she is making progress toward Pulmonary Rehab goals. Patient reports her health and activity level has improved in the past 30 days as evidenced by patient's report of increased ability to breathe better with activity. Patient states family/friends have noticed changes in her activity or mood. Patient reports feeling positive about current and projected progression in Pulmonary Rehab. After reviewing the patient's treatment plan, the patient is making progress toward Pulmonary Rehab goals. Patient's rate of progress toward rehab goals is good. Plan of action to help patient continue to work towards rehab goals include encouragement, support and education. Will continue to monitor and evaluate progress toward psychosocial goal(s).  Goal(s) in progress: Improved management of stress Improved coping skills Help patient work toward returning to meaningful activities that improve patient's QOL and are attainable with patient's lung disease Lose weight Breathe better

## 2015-04-04 ENCOUNTER — Encounter (HOSPITAL_COMMUNITY)
Admission: RE | Admit: 2015-04-04 | Discharge: 2015-04-04 | Disposition: A | Discharge status: Home / Self Care | Payer: BLUE CROSS/BLUE SHIELD | Source: Ambulatory Visit | Attending: Internal Medicine | Admitting: Internal Medicine

## 2015-04-04 DIAGNOSIS — J449 Chronic obstructive pulmonary disease, unspecified: Secondary | ICD-10-CM | POA: Diagnosis not present

## 2015-04-09 ENCOUNTER — Encounter (HOSPITAL_COMMUNITY)
Admission: RE | Admit: 2015-04-09 | Discharge: 2015-04-09 | Disposition: A | Discharge status: Home / Self Care | Payer: BLUE CROSS/BLUE SHIELD | Source: Ambulatory Visit | Attending: Internal Medicine | Admitting: Internal Medicine

## 2015-04-09 DIAGNOSIS — J449 Chronic obstructive pulmonary disease, unspecified: Secondary | ICD-10-CM | POA: Diagnosis not present

## 2015-04-11 ENCOUNTER — Encounter (HOSPITAL_COMMUNITY)
Admission: RE | Admit: 2015-04-11 | Discharge: 2015-04-11 | Disposition: A | Discharge status: Home / Self Care | Payer: BLUE CROSS/BLUE SHIELD | Source: Ambulatory Visit | Attending: Internal Medicine | Admitting: Internal Medicine

## 2015-04-11 DIAGNOSIS — J449 Chronic obstructive pulmonary disease, unspecified: Secondary | ICD-10-CM | POA: Diagnosis not present

## 2015-04-16 ENCOUNTER — Encounter (HOSPITAL_COMMUNITY)
Admission: RE | Admit: 2015-04-16 | Discharge: 2015-04-16 | Disposition: A | Discharge status: Home / Self Care | Payer: BLUE CROSS/BLUE SHIELD | Source: Ambulatory Visit | Attending: Internal Medicine | Admitting: Internal Medicine

## 2015-04-16 DIAGNOSIS — J449 Chronic obstructive pulmonary disease, unspecified: Secondary | ICD-10-CM | POA: Diagnosis not present

## 2015-04-18 ENCOUNTER — Encounter (HOSPITAL_COMMUNITY)
Admission: RE | Admit: 2015-04-18 | Discharge: 2015-04-18 | Disposition: A | Discharge status: Home / Self Care | Payer: BLUE CROSS/BLUE SHIELD | Source: Ambulatory Visit | Attending: Internal Medicine | Admitting: Internal Medicine

## 2015-04-18 DIAGNOSIS — J449 Chronic obstructive pulmonary disease, unspecified: Secondary | ICD-10-CM | POA: Diagnosis not present

## 2015-04-22 ENCOUNTER — Telehealth: Payer: Self-pay | Admitting: Allergy and Immunology

## 2015-04-22 NOTE — Telephone Encounter (Signed)
Samples will be ready for patient to pick up tomorrow in [place] . Patient notified.

## 2015-04-22 NOTE — Telephone Encounter (Signed)
PT WOULD LIKE TO PICK UP SOME SAMPLE AT THE [place] OFFICE TOMORROW. SHE NEED THE DULERA [place]. CALL HER AT [phone].

## 2015-04-23 ENCOUNTER — Encounter (HOSPITAL_COMMUNITY)
Admission: RE | Admit: 2015-04-23 | Discharge: 2015-04-23 | Disposition: A | Discharge status: Home / Self Care | Payer: BLUE CROSS/BLUE SHIELD | Source: Ambulatory Visit | Attending: Internal Medicine | Admitting: Internal Medicine

## 2015-04-23 DIAGNOSIS — J449 Chronic obstructive pulmonary disease, unspecified: Secondary | ICD-10-CM | POA: Diagnosis not present

## 2015-04-25 ENCOUNTER — Encounter (HOSPITAL_COMMUNITY)
Admission: RE | Admit: 2015-04-25 | Discharge: 2015-04-25 | Disposition: A | Discharge status: Home / Self Care | Payer: BLUE CROSS/BLUE SHIELD | Source: Ambulatory Visit | Attending: Internal Medicine | Admitting: Internal Medicine

## 2015-04-25 DIAGNOSIS — J449 Chronic obstructive pulmonary disease, unspecified: Secondary | ICD-10-CM | POA: Insufficient documentation

## 2015-04-29 ENCOUNTER — Ambulatory Visit (INDEPENDENT_AMBULATORY_CARE_PROVIDER_SITE_OTHER): Payer: BLUE CROSS/BLUE SHIELD | Admitting: Nurse Practitioner

## 2015-04-29 ENCOUNTER — Encounter: Payer: Self-pay | Admitting: Nurse Practitioner

## 2015-04-29 VITALS — BP 142/69 | HR 71 | Temp 97.7°F | Ht 62.0 in | Wt 185.6 lb

## 2015-04-29 DIAGNOSIS — R1310 Dysphagia, unspecified: Secondary | ICD-10-CM | POA: Diagnosis not present

## 2015-04-29 DIAGNOSIS — K219 Gastro-esophageal reflux disease without esophagitis: Secondary | ICD-10-CM

## 2015-04-29 MED ORDER — DEXLANSOPRAZOLE 60 MG PO CPDR: 60.0000 mg | DELAYED_RELEASE_CAPSULE | Freq: Every day | ORAL | Status: DC

## 2015-04-29 NOTE — Progress Notes (Signed)
Alicia Hall 60 y.o. female            56 day Psychosocial Note  Patient psychosocial assessment reveals no barriers to participation in Pulmonary Rehab. Psychosocial areas that are currently affecting patient's rehab experience include concerns about family, rating a 5 on stress level. Patient does continue to exhibit positive coping skills to deal with her psychosocial concerns. Offered emotional support and reassurance. Patient does feel she is making progress toward Pulmonary Rehab goals. Patient reports her health and activity level has improved but not much in the past 30 days  States she able to do things longer without so much SOB. Patient states family/friends have noticed changes in her activity or mood. Patient reports feeling positive about current and projected progression in Pulmonary Rehab. After reviewing the patient's treatment plan, the patient is making progress toward Pulmonary Rehab goals. Patient's rate of progress toward rehab goals is good. Plan of action to help patient continue to work towards rehab goals include encouragement, support and education. Will continue to monitor and evaluate progress toward psychosocial goal(s).  Goal(s) in progress:  Improved management of stress  Improved coping skills  Help patient work toward returning to meaningful activities that improve patient's QOL and are attainable with patient's lung disease  Lose weight  Breathe better

## 2015-04-29 NOTE — Patient Instructions (Signed)
1. We can give you a few samples. 2. I will send in a prescription for Dexilant to your pharmacy. 3. Use the co-pay card to help with her co-pay out of pocket costs.  4. Return for follow-up in one year, or sooner if needed for worsening or returning symptoms.

## 2015-04-29 NOTE — Addendum Note (Signed)
Addended by: Gordy Levan, Mahika Vanvoorhis A on: 04/29/2015 10:00 AM   Modules accepted: Orders

## 2015-04-29 NOTE — Assessment & Plan Note (Signed)
Symptoms resolved. Continue to monitor. Return for follow-up in one year or sooner if needed.

## 2015-04-29 NOTE — Assessment & Plan Note (Signed)
Symptoms dramatically improved on Dexilant. Rare breakthrough symptoms. Continue Dexilant, return for follow-up in one year or sooner if needed for worsening or recurrent symptoms. Co-pay card provided.

## 2015-04-29 NOTE — Progress Notes (Signed)
cc'ed to pcp °

## 2015-04-29 NOTE — Progress Notes (Signed)
Referring Provider: Caryl Bis, MD Primary Care Physician:  Gar Ponto, MD Primary GI:  Dr. Gala Romney  Chief Complaint  Patient presents with  . Gastroesophageal Reflux    HPI:   Alicia Hall is a 60 y.o. female who presents for GERD and dysphagia. Last seen in our office of 04/14/2014 for the same as well as screening colonoscopy. At that time she was complaining of worsening hoarseness, evaluation by ENT noted laryngeal reflux and recommended GI referral. Was on Prilosec at that time. Colonoscopy and endoscopy were completed. Endoscopy found out changes of reflux esophagitis, status post Maloney dilation, small hiatal hernia. Recommended stop Prilosec and start Dexilant. Colonoscopy found multiple colon polyps which were removed and sent to surgical pathology. Pathology found all polyps to be hyperplastic and was recommended for 10 year repeat colonoscopy. Follow-up phone note from the patient states that Dexilant was working well and is since been sent in a prescription.  Today she states she's doing well on Dexilant. Rare breakthrough symptoms. Denies abdominal pain, vomiting. Occasional nausea which she attributes to sinus drainage from allergies. Denies hematochezia, melena. Denies chest pain, dyspnea, dizziness, lightheadedness, syncope, near syncope. Denies any other upper or lower GI symptoms.   Past Medical History  Diagnosis Date  . Hypertension   . Asthma   . Seasonal allergies   . OSA (obstructive sleep apnea)     on CPAP  . GERD (gastroesophageal reflux disease)   . COPD (chronic obstructive pulmonary disease) [facility])     Past Surgical History  Procedure Laterality Date  . Colonoscopy N/A 01/31/2015    RMR: Multiple colonic polyps treated/removed as described above  . Esophagogastroduodenoscopy N/A 01/31/2015    RMR: mild changes of reflux esophagitis. Tiny hiatal hernia. Status post passage of a Maloney dilator.   Venia Minks dilation N/A 01/31/2015    Procedure:  Venia Minks DILATION;  Surgeon: Daneil Dolin, MD;  Location: [facility];  Service: Endoscopy;  Laterality: N/A;    Current Outpatient Prescriptions  Medication Sig Dispense Refill  . albuterol (PROVENTIL HFA;VENTOLIN HFA) 108 (90 BASE) MCG/ACT inhaler Inhale 2 puffs into the lungs every 6 (six) hours as needed for wheezing or shortness of breath.    Marland Kitchen albuterol (PROVENTIL) (2.5 MG/3ML) 0.083% nebulizer solution Take 2.5 mg by nebulization every 6 (six) hours as needed for wheezing or shortness of breath.    Marland Kitchen amLODipine (NORVASC) 10 MG tablet Take 10 mg by mouth daily.    . Azelastine-Fluticasone (DYMISTA) 137-50 MCG/ACT SUSP Place 1 spray into the nose 2 (two) times daily. 6 g 0  . cetirizine (ZYRTEC) 10 MG tablet Take 10 mg by mouth daily.    Marland Kitchen dexlansoprazole (DEXILANT) 60 MG capsule Take 1 capsule (60 mg total) by mouth daily. 30 capsule 11  . losartan-hydrochlorothiazide (HYZAAR) 100-12.5 MG tablet Take 1 tablet by mouth daily.    . meloxicam (MOBIC) 15 MG tablet Take 15 mg by mouth daily.    . mometasone-formoterol (DULERA) 200-5 MCG/ACT AERO Inhale 2 puffs into the lungs 2 (two) times daily at 10 AM and 5 PM. 8.8 g 1  . montelukast (SINGULAIR) 10 MG tablet Take 10 mg by mouth daily.    . Multiple Vitamins-Minerals (MULTIVITAMIN WITH MINERALS) tablet Take 1 tablet by mouth daily.    . norethindrone (AYGESTIN) 5 MG tablet Take 2.5 mg by mouth daily.    . Omega-3 Fatty Acids (FISH OIL) 1000 MG CAPS Take 2 capsules by mouth daily.    Marland Kitchen  ranitidine (ZANTAC) 150 MG tablet Take 150 mg by mouth 2 (two) times daily.    . Red Yeast Rice 600 MG CAPS Take 2 capsules by mouth daily.    . sertraline (ZOLOFT) 100 MG tablet Take 100 mg by mouth daily.    Marland Kitchen tiotropium (SPIRIVA HANDIHALER) 18 MCG inhalation capsule Place 1 capsule (18 mcg total) into inhaler and inhale daily. 30 capsule 3   No current facility-administered medications for this visit.    Allergies as of 04/29/2015 - Review Complete  04/29/2015  Allergen Reaction Noted  . Levaquin [levofloxacin]    . Lisinopril Cough 11/15/2014    Family History  Problem Relation Age of Onset  . Emphysema Father   . Emphysema Mother   . Lung cancer Maternal Grandfather   . Heart attack Maternal Grandmother     Social History   Social History  . Marital Status: Married    Spouse Name: N/A  . Number of Children: N/A  . Years of Education: N/A   Occupational History  . house wife    Social History Main Topics  . Smoking status: Former Smoker -- 1.50 packs/day for 25 years    Types: Cigarettes    Quit date: 02/24/1995  . Smokeless tobacco: None  . Alcohol Use: 0.0 oz/week    0 Standard drinks or equivalent per week     Comment: rarely  . Drug Use: No  . Sexual Activity: Not Asked   Other Topics Concern  . None   Social History Narrative    Review of Systems: General: Negative for anorexia, weight loss, fever, chills, fatigue, weakness. ENT: Negative for hoarseness, difficulty swallowing. CV: Negative for chest pain, angina, palpitations, peripheral edema.  Respiratory: Negative for worsening dyspnea, cough, sputum, wheezing.  GI: See history of present illness. Endo: Negative for unusual weight change.    Physical Exam: BP 142/69 mmHg  Pulse 71  Temp(Src) 97.7 F (36.5 C) (Oral)  Ht 5\' 2"  (1.575 m)  Wt 185 lb 9.6 oz (84.188 kg)  BMI 33.94 kg/m2 General:   Alert and oriented. Pleasant and cooperative. Well-nourished and well-developed.  Ears:  Normal auditory acuity. Cardiovascular:  S1, S2 present without murmurs appreciated. Extremities without clubbing or edema. Respiratory:  Bilateral mild expiratory wheezes. No rales or rhonchi. No distress.  Gastrointestinal:  +BS, rounded but soft, non-tender and non-distended. No HSM noted. No guarding or rebound. No masses appreciated.  Rectal:  Deferred  Musculoskalatal:  Symmetrical without gross deformities.  Skin:  Intact without significant lesions or  rashes. Neurologic:  Alert and oriented x4;  grossly normal neurologically. Psych:  Alert and cooperative. Normal mood and affect. Heme/Lymph/Immune: No excessive bruising noted.    04/29/2015 9:33 AM   Disclaimer: This note was dictated with voice recognition software. Similar sounding words can inadvertently be transcribed and may not be corrected upon review.

## 2015-04-30 ENCOUNTER — Encounter (HOSPITAL_COMMUNITY)
Admission: RE | Admit: 2015-04-30 | Discharge: 2015-04-30 | Disposition: A | Discharge status: Home / Self Care | Payer: BLUE CROSS/BLUE SHIELD | Source: Ambulatory Visit | Attending: Internal Medicine | Admitting: Internal Medicine

## 2015-04-30 DIAGNOSIS — J449 Chronic obstructive pulmonary disease, unspecified: Secondary | ICD-10-CM | POA: Diagnosis not present

## 2015-05-02 ENCOUNTER — Encounter (HOSPITAL_COMMUNITY)
Admission: RE | Admit: 2015-05-02 | Discharge: 2015-05-02 | Disposition: A | Discharge status: Home / Self Care | Payer: BLUE CROSS/BLUE SHIELD | Source: Ambulatory Visit | Attending: Internal Medicine | Admitting: Internal Medicine

## 2015-05-02 DIAGNOSIS — J449 Chronic obstructive pulmonary disease, unspecified: Secondary | ICD-10-CM | POA: Diagnosis not present

## 2015-05-07 ENCOUNTER — Encounter (HOSPITAL_COMMUNITY): Payer: BLUE CROSS/BLUE SHIELD

## 2015-05-08 ENCOUNTER — Telehealth: Payer: Self-pay

## 2015-05-08 NOTE — Telephone Encounter (Signed)
Tried to do a PA for dexilant. Insurance would not approve it because pt has only tried and failed omeprazole. They require pts to try and fail at least 2 formulary alternatives : lansoprazole, esomeprazole, pantoprazole.

## 2015-05-09 ENCOUNTER — Encounter (HOSPITAL_COMMUNITY)
Admission: RE | Admit: 2015-05-09 | Discharge: 2015-05-09 | Disposition: A | Discharge status: Home / Self Care | Payer: BLUE CROSS/BLUE SHIELD | Source: Ambulatory Visit | Attending: Internal Medicine | Admitting: Internal Medicine

## 2015-05-09 DIAGNOSIS — J449 Chronic obstructive pulmonary disease, unspecified: Secondary | ICD-10-CM | POA: Diagnosis not present

## 2015-05-14 ENCOUNTER — Encounter (HOSPITAL_COMMUNITY)
Admission: RE | Admit: 2015-05-14 | Discharge: 2015-05-14 | Disposition: A | Discharge status: Home / Self Care | Payer: BLUE CROSS/BLUE SHIELD | Source: Ambulatory Visit | Attending: Internal Medicine | Admitting: Internal Medicine

## 2015-05-14 ENCOUNTER — Telehealth: Payer: Self-pay | Admitting: Internal Medicine

## 2015-05-14 DIAGNOSIS — J449 Chronic obstructive pulmonary disease, unspecified: Secondary | ICD-10-CM | POA: Diagnosis not present

## 2015-05-14 NOTE — Telephone Encounter (Signed)
Dexilant 60 mg #10 at front desk for pick up. Please let pt know.

## 2015-05-14 NOTE — Telephone Encounter (Signed)
Pt called to see if she could stop by around 11am to pick up some samples of Dexilant. Please advise.

## 2015-05-15 MED ORDER — PANTOPRAZOLE SODIUM 40 MG PO TBEC: 40.0000 mg | DELAYED_RELEASE_TABLET | Freq: Every day | ORAL | Status: DC

## 2015-05-15 NOTE — Telephone Encounter (Signed)
Routing back to the refill box. This was never addressed.

## 2015-05-15 NOTE — Telephone Encounter (Signed)
Tried to call pt- she was not at home. LM for her to return call.

## 2015-05-15 NOTE — Telephone Encounter (Signed)
I have sent in Protonix.

## 2015-05-16 ENCOUNTER — Encounter (HOSPITAL_COMMUNITY)
Admission: RE | Admit: 2015-05-16 | Discharge: 2015-05-16 | Disposition: A | Discharge status: Home / Self Care | Payer: BLUE CROSS/BLUE SHIELD | Source: Ambulatory Visit | Attending: Internal Medicine | Admitting: Internal Medicine

## 2015-05-16 DIAGNOSIS — J449 Chronic obstructive pulmonary disease, unspecified: Secondary | ICD-10-CM | POA: Diagnosis not present

## 2015-05-16 NOTE — Telephone Encounter (Signed)
Tried to call pt- LMOM with recommendations  

## 2015-05-20 ENCOUNTER — Ambulatory Visit: Payer: BLUE CROSS/BLUE SHIELD | Admitting: Internal Medicine

## 2015-05-20 NOTE — Telephone Encounter (Signed)
Letter mailed to the pt. 

## 2015-05-21 ENCOUNTER — Encounter (HOSPITAL_COMMUNITY)
Admission: RE | Admit: 2015-05-21 | Discharge: 2015-05-21 | Disposition: A | Discharge status: Home / Self Care | Payer: BLUE CROSS/BLUE SHIELD | Source: Ambulatory Visit | Attending: Internal Medicine | Admitting: Internal Medicine

## 2015-05-21 ENCOUNTER — Ambulatory Visit (INDEPENDENT_AMBULATORY_CARE_PROVIDER_SITE_OTHER): Payer: BLUE CROSS/BLUE SHIELD | Admitting: Allergy and Immunology

## 2015-05-21 ENCOUNTER — Encounter: Payer: Self-pay | Admitting: Allergy and Immunology

## 2015-05-21 VITALS — BP 128/66 | HR 72 | Temp 98.0°F | Resp 16

## 2015-05-21 DIAGNOSIS — J4540 Moderate persistent asthma, uncomplicated: Secondary | ICD-10-CM

## 2015-05-21 DIAGNOSIS — J3089 Other allergic rhinitis: Secondary | ICD-10-CM

## 2015-05-21 DIAGNOSIS — J449 Chronic obstructive pulmonary disease, unspecified: Secondary | ICD-10-CM | POA: Diagnosis not present

## 2015-05-21 DIAGNOSIS — J309 Allergic rhinitis, unspecified: Secondary | ICD-10-CM | POA: Diagnosis not present

## 2015-05-21 NOTE — Progress Notes (Signed)
     FOLLOW UP NOTE  RE: Alicia Hall MRN: [id number] DOB: 1955/10/02 [facility]  [place]. [address]. [place], [place] [address] Date of Office Visit: 05/21/2015  Subjective:  Alicia Hall is a 60 y.o. female who presents today for Asthma  Assessment:   1. Moderate persistent asthma.    2. Perennial allergic rhinitis.    3.      History of COPD as followed by pulmonology. Plan:  No orders of the defined types were placed in this encounter.   Patient Instructions  1.  Continue current medication regime--Dulera, Zyrtec, Singulair, Dymista and Spiriva. 2.  Sleep study as discussed. 3.  Keep follow-up with Dr. Chase Caller. 4.  Saline nasal wash daily, especially with outdoor time and fluctuant weather patterns. 5.  Communicate with primary M.D. regarding reflux medication update.   6.  Follow-up in 6 months or sooner if needed.  HPI: Alicia Hall returns to the office in follow-up of asthma and allergic rhinitis.  Since her last visit November 2016, she reports feeling well, generally only using albuterol once or twice a month (one use) with the significant fluctuating, weather patterns.  She feels maintenance medications are definitely beneficial and recently change was made to [place], which she feels has given her the best relief, but she has to obtain appropriate coverage for insurance (she had left over Protonix to use).  Denies throat clearing, postnasal drip, cough, wheeze, shortness of breath, congestion, rhinorrhea, sneezing, headache, sore throat, fever or other recurring concerns.  Denies ED or urgent care visits, prednisone or antibiotic courses. Reports sleep and activity are normal.  Alicia Hall has a current medication list which includes the following prescription(s): albuterol neb, albuterol, amlodipine, azelastine-fluticasone, cetirizine, losartan-hydrochlorothiazide, mometasone-formoterol, montelukast, multivitamin with minerals, norethindrone, fish oil,  ranitidine, red yeast rice, sertraline, tiotropium and pantoprazole.   Drug Allergies: Allergies  Allergen Reactions  . Levaquin [Levofloxacin]     nausea  . Lisinopril Cough   Objective:   Filed Vitals:   05/21/15 1656  BP: 128/66  Pulse: 72  Temp: 98 F (36.7 C)  Resp: 16   SpO2 Readings from Last 1 Encounters:  05/21/15 96%   Physical Exam  Constitutional: She is well-developed, well-nourished, and in no distress.  HENT:  Head: Atraumatic.  Right Ear: Tympanic membrane and ear canal normal.  Left Ear: Tympanic membrane and ear canal normal.  Nose: Mucosal edema present. No rhinorrhea. No epistaxis.  Mouth/Throat: Oropharynx is clear and moist and mucous membranes are normal. No oropharyngeal exudate, posterior oropharyngeal edema or posterior oropharyngeal erythema.  Neck: Neck supple.  Cardiovascular: Normal rate, S1 normal and S2 normal.   No murmur heard. Pulmonary/Chest: Effort normal. She has no wheezes. She has no rhonchi. She has no rales.  Lymphadenopathy:    She has no cervical adenopathy.   Diagnostics: Spirometry:  FVC 2.1--85%, FEV1 1.36--66%.      Alicia Hall M. Ishmael Holter, MD  cc: Gar Ponto, MD

## 2015-05-22 NOTE — Progress Notes (Signed)
Alicia Hall 60 y.o. female  60 day Psychosocial Note Patient psychosocial assessment reveals no barriers to participation in Pulmonary Rehab. Psychosocial areas that are currently affecting patient's rehab experience include concerns about family, rating a 5 on stress level but is getting better. Patient does continue to exhibit positive coping skills to deal with her psychosocial concerns. Offered emotional support and reassurance. Patient does feel she is making progress toward Pulmonary Rehab goals. Patient reports her health and activity level has improved but little in the past 30 days States she able to do things longer without so much SOB. Patient states family/friends have noticed changes in her activity or mood. Patient reports feeling positive about current and projected progression in Pulmonary Rehab. After reviewing the patient's treatment plan, the patient is making progress toward Pulmonary Rehab goals. Patient's rate of progress toward rehab goals is good. Plan of action to help patient continue to work towards rehab goals include encouragement, support and education. Will continue to monitor and evaluate progress toward psychosocial goal(s).  Goal(s) in progress:  Improved management of stress  Improved coping skills  Help patient work toward returning to meaningful activities that improve patient's QOL and are attainable with patient's lung disease  Lose weight  Breathe better

## 2015-05-23 ENCOUNTER — Encounter (HOSPITAL_COMMUNITY): Payer: BLUE CROSS/BLUE SHIELD

## 2015-05-24 NOTE — Patient Instructions (Signed)
  Continue current medication regime.  Sleep study as discussed.  Follow-up in 6 months.

## 2015-05-25 DIAGNOSIS — J4541 Moderate persistent asthma with (acute) exacerbation: Secondary | ICD-10-CM | POA: Diagnosis not present

## 2015-05-25 DIAGNOSIS — J439 Emphysema, unspecified: Secondary | ICD-10-CM | POA: Diagnosis not present

## 2015-05-25 DIAGNOSIS — J301 Allergic rhinitis due to pollen: Secondary | ICD-10-CM | POA: Diagnosis not present

## 2015-05-25 DIAGNOSIS — I10 Essential (primary) hypertension: Secondary | ICD-10-CM | POA: Diagnosis not present

## 2015-05-28 ENCOUNTER — Encounter (HOSPITAL_COMMUNITY)
Admission: RE | Admit: 2015-05-28 | Discharge: 2015-05-28 | Disposition: A | Discharge status: Home / Self Care | Payer: BLUE CROSS/BLUE SHIELD | Source: Ambulatory Visit | Attending: Internal Medicine | Admitting: Internal Medicine

## 2015-05-28 DIAGNOSIS — J449 Chronic obstructive pulmonary disease, unspecified: Secondary | ICD-10-CM | POA: Insufficient documentation

## 2015-05-30 ENCOUNTER — Encounter (HOSPITAL_COMMUNITY): Payer: BLUE CROSS/BLUE SHIELD

## 2015-06-04 ENCOUNTER — Encounter (HOSPITAL_COMMUNITY)
Admission: RE | Admit: 2015-06-04 | Discharge: 2015-06-04 | Disposition: A | Discharge status: Home / Self Care | Payer: BLUE CROSS/BLUE SHIELD | Source: Ambulatory Visit | Attending: Internal Medicine | Admitting: Internal Medicine

## 2015-06-04 DIAGNOSIS — J449 Chronic obstructive pulmonary disease, unspecified: Secondary | ICD-10-CM | POA: Diagnosis not present

## 2015-06-06 ENCOUNTER — Encounter (HOSPITAL_COMMUNITY)
Admission: RE | Admit: 2015-06-06 | Discharge: 2015-06-06 | Disposition: A | Discharge status: Home / Self Care | Payer: BLUE CROSS/BLUE SHIELD | Source: Ambulatory Visit | Attending: Internal Medicine | Admitting: Internal Medicine

## 2015-06-06 DIAGNOSIS — J449 Chronic obstructive pulmonary disease, unspecified: Secondary | ICD-10-CM | POA: Diagnosis not present

## 2015-06-10 DIAGNOSIS — Z1231 Encounter for screening mammogram for malignant neoplasm of breast: Secondary | ICD-10-CM | POA: Diagnosis not present

## 2015-06-11 ENCOUNTER — Ambulatory Visit: Payer: BLUE CROSS/BLUE SHIELD | Admitting: Internal Medicine

## 2015-06-11 ENCOUNTER — Encounter (HOSPITAL_COMMUNITY): Payer: BLUE CROSS/BLUE SHIELD

## 2015-06-13 ENCOUNTER — Encounter (HOSPITAL_COMMUNITY): Payer: BLUE CROSS/BLUE SHIELD

## 2015-06-18 ENCOUNTER — Encounter (HOSPITAL_COMMUNITY)
Admission: RE | Admit: 2015-06-18 | Discharge: 2015-06-18 | Disposition: A | Discharge status: Home / Self Care | Payer: BLUE CROSS/BLUE SHIELD | Source: Ambulatory Visit | Attending: Internal Medicine | Admitting: Internal Medicine

## 2015-06-18 DIAGNOSIS — J449 Chronic obstructive pulmonary disease, unspecified: Secondary | ICD-10-CM | POA: Diagnosis not present

## 2015-06-19 ENCOUNTER — Encounter: Payer: Self-pay | Admitting: Pulmonary Disease

## 2015-06-19 ENCOUNTER — Ambulatory Visit (INDEPENDENT_AMBULATORY_CARE_PROVIDER_SITE_OTHER): Payer: BLUE CROSS/BLUE SHIELD | Admitting: Pulmonary Disease

## 2015-06-19 VITALS — BP 134/68 | HR 71 | Ht 62.5 in | Wt 185.2 lb

## 2015-06-19 DIAGNOSIS — G4700 Insomnia, unspecified: Secondary | ICD-10-CM

## 2015-06-19 DIAGNOSIS — G4730 Sleep apnea, unspecified: Secondary | ICD-10-CM

## 2015-06-19 DIAGNOSIS — G4710 Hypersomnia, unspecified: Secondary | ICD-10-CM

## 2015-06-19 DIAGNOSIS — F329 Major depressive disorder, single episode, unspecified: Secondary | ICD-10-CM

## 2015-06-19 DIAGNOSIS — G4733 Obstructive sleep apnea (adult) (pediatric): Secondary | ICD-10-CM | POA: Insufficient documentation

## 2015-06-19 DIAGNOSIS — F32A Depression, unspecified: Secondary | ICD-10-CM

## 2015-06-19 NOTE — Assessment & Plan Note (Signed)
Pt is here for hypersomnia despite use of cpap. She claims she uses cpap all the time but her DL the last month was 50% and her AHI is < 5. Her sleep schedule is all over the place. She has depression/anxiety but she feels this is stable.  Plan: 1. We extensively discussed the reasons for her hypersomnia. 2. D/w her regarding 5 rules to sleep hygiene. 3. She needs to be compliant with cpap for the next month. 4. Suggest she gets switched from zoloft to trazodone to help her sleep at night. I suggested remeron but she did not gain weight. 5. May need modafinil if she remains sleepy and tired despite using the cpap and following rules of good sleep hygiene.  6. Will need ABG -- R/O hypercapnea causing sleepiness.

## 2015-06-19 NOTE — Progress Notes (Signed)
Subjective:    Patient ID: Alicia Hall, female    DOB: 1955/06/16, 60 y.o.   MRN: [id number]  HPI   This is the case of Alicia Hall, 60 y.o. Female, who was referred by Dr. Kern Hall  in consultation regarding OSA and has hypersomnia.   As you very well know, patient was dxed with OSA in 2007. Has had 3 studies, last one was 2 yrs ago. Done at [facility] or [facility]. Last study was done as pt has hypersomnia. It appears pt has repeated sleep studies 2/2 persistence of hypersomnia.   Current machine is 3-5 yrs old. Gets stuff from [facility] in [place].   Pt has never felt better since using cpap. Pt is 90% tired. Not significantly improved with cpap.  Not sure if she still snores.  Not sure about witnessed apneas.  Pt remains sleepy during daytime.  Sleeps 3-4 hrs/night. Has different sleep time, anywhere from 11pm-4am.  Her sleep shedule is all over the place.   Has depression but she feels it is not a major issue. She feels depression is controlled.       Review of Systems  Constitutional: Positive for unexpected weight change. Negative for fever.  HENT: Positive for congestion, sinus pressure and sneezing. Negative for dental problem, ear pain, nosebleeds, postnasal drip, rhinorrhea, sore throat and trouble swallowing.   Eyes: Negative for redness and itching.  Respiratory: Positive for shortness of breath. Negative for cough, chest tightness and wheezing.   Cardiovascular: Negative for palpitations and leg swelling.  Gastrointestinal: Positive for nausea. Negative for vomiting.  Genitourinary: Negative for dysuria.  Musculoskeletal: Negative for joint swelling.  Skin: Negative for rash.  Neurological: Positive for headaches.  Hematological: Does not bruise/bleed easily.  Psychiatric/Behavioral: Negative for dysphoric mood. The patient is not nervous/anxious.     Past Medical History  Diagnosis Date  . Hypertension   . Asthma   . Seasonal allergies     . OSA (obstructive sleep apnea)     on CPAP  . GERD (gastroesophageal reflux disease)   . COPD (chronic obstructive pulmonary disease) ([facility])      Family History  Problem Relation Age of Onset  . Emphysema Father   . Emphysema Mother   . Lung cancer Maternal Grandfather   . Heart attack Maternal Grandmother      Past Surgical History  Procedure Laterality Date  . Colonoscopy N/A 01/31/2015    RMR: Multiple colonic polyps treated/removed as described above  . Esophagogastroduodenoscopy N/A 01/31/2015    RMR: mild changes of reflux esophagitis. Tiny hiatal hernia. Status post passage of a Maloney dilator.   Venia Minks dilation N/A 01/31/2015    Procedure: Venia Minks DILATION;  Surgeon: Daneil Dolin, MD;  Location: [facility];  Service: Endoscopy;  Laterality: N/A;    Social History   Social History  . Marital Status: Married    Spouse Name: N/A  . Number of Children: N/A  . Years of Education: N/A   Occupational History  . house wife    Social History Main Topics  . Smoking status: Former Smoker -- 1.50 packs/day for 25 years    Types: Cigarettes    Quit date: 02/24/1995  . Smokeless tobacco: Not on file  . Alcohol Use: 0.0 oz/week    0 Standard drinks or equivalent per week     Comment: rarely  . Drug Use: No  . Sexual Activity: Not on file   Other Topics Concern  .  Not on file   Social History Narrative     Allergies  Allergen Reactions  . Levaquin [Levofloxacin]     nausea  . Lisinopril Cough     Outpatient Prescriptions Prior to Visit  Medication Sig Dispense Refill  . albuterol (PROVENTIL HFA;VENTOLIN HFA) 108 (90 BASE) MCG/ACT inhaler Inhale 2 puffs into the lungs every 6 (six) hours as needed for wheezing or shortness of breath.    Marland Kitchen albuterol (PROVENTIL) (2.5 MG/3ML) 0.083% nebulizer solution Take 2.5 mg by nebulization every 6 (six) hours as needed for wheezing or shortness of breath.    Marland Kitchen amLODipine (NORVASC) 10 MG tablet Take 10 mg by mouth  daily.    . Azelastine-Fluticasone (DYMISTA) 137-50 MCG/ACT SUSP Place 1 spray into the nose 2 (two) times daily. 6 g 0  . cetirizine (ZYRTEC) 10 MG tablet Take 10 mg by mouth daily.    Marland Kitchen dexlansoprazole (DEXILANT) 60 MG capsule Take 1 capsule (60 mg total) by mouth daily. 30 capsule 11  . losartan-hydrochlorothiazide (HYZAAR) 100-12.5 MG tablet Take 1 tablet by mouth daily.    . meloxicam (MOBIC) 15 MG tablet Take 15 mg by mouth daily. Reported on 05/21/2015    . mometasone-formoterol (DULERA) 200-5 MCG/ACT AERO Inhale 2 puffs into the lungs 2 (two) times daily at 10 AM and 5 PM. 8.8 g 1  . montelukast (SINGULAIR) 10 MG tablet Take 10 mg by mouth daily.    . Multiple Vitamins-Minerals (MULTIVITAMIN WITH MINERALS) tablet Take 1 tablet by mouth daily.    . norethindrone (AYGESTIN) 5 MG tablet Take 2.5 mg by mouth daily.    . Omega-3 Fatty Acids (FISH OIL) 1000 MG CAPS Take 2 capsules by mouth daily.    . pantoprazole (PROTONIX) 40 MG tablet Take 1 tablet (40 mg total) by mouth daily. 30 tablet 3  . ranitidine (ZANTAC) 150 MG tablet Take 150 mg by mouth 2 (two) times daily.    . Red Yeast Rice 600 MG CAPS Take 2 capsules by mouth daily.    . sertraline (ZOLOFT) 100 MG tablet Take 100 mg by mouth daily.    Marland Kitchen tiotropium (SPIRIVA HANDIHALER) 18 MCG inhalation capsule Place 1 capsule (18 mcg total) into inhaler and inhale daily. 30 capsule 3   No facility-administered medications prior to visit.   No orders of the defined types were placed in this encounter.          Objective:   Physical Exam   Vitals:  Filed Vitals:   06/19/15 1419  BP: 134/68  Pulse: 71  Height: 5' 2.5" (1.588 m)  Weight: 185 lb 3.2 oz (84.006 kg)  SpO2: 97%    Constitutional/General:  Pleasant, well-nourished, well-developed, not in any distress,  Comfortably seating.  Well kempt  Body mass index is 33.31 kg/(m^2). Wt Readings from Last 3 Encounters:  06/19/15 185 lb 3.2 oz (84.006 kg)  04/29/15 185 lb 9.6  oz (84.188 kg)  03/08/15 192 lb 6.4 oz (87.272 kg)    Neck circumference:   HEENT: Pupils equal and reactive to light and accommodation. Anicteric sclerae. Normal nasal mucosa.   No oral  lesions,  mouth clear,  oropharynx clear, no postnasal drip. (-) Oral thrush. No dental caries.  Airway - Mallampati class III  Neck: No masses. Midline trachea. No JVD, (-) LAD. (-) bruits appreciated.  Respiratory/Chest: Grossly normal chest. (-) deformity. (-) Accessory muscle use.  Symmetric expansion. (-) Tenderness on palpation.  Resonant on percussion.  Diminished BS on both lower lung  zones. (-) wheezing, crackles, rhonchi (-) egophony  Cardiovascular: Regular rate and  rhythm, heart sounds normal, no murmur or gallops, no peripheral edema  Gastrointestinal:  Normal bowel sounds. Soft, non-tender. No hepatosplenomegaly.  (-) masses.   Musculoskeletal:  Normal muscle tone. Normal gait.   Extremities: Grossly normal. (-) clubbing, cyanosis.  (-) edema  Skin: (-) rash,lesions seen.   Neurological/Psychiatric : alert, oriented to time, place, person. Normal mood and affect         Assessment & Plan:  OSA (obstructive sleep apnea) Pt has osa, unknown severity. On autocpap. DL the last month 50%, AHI < 5. Needs to use cpap everytime she sleeps. Needs a 1 month DL.   Depression Pt with depression. On zoloft. Suggest switching to a sedating antidepressant. Maybe remeron. Maybe trazodone. Told pt to let pcp know.  Hypersomnia with sleep apnea Pt is here for hypersomnia despite use of cpap. She claims she uses cpap all the time but her DL the last month was 50% and her AHI is < 5. Her sleep schedule is all over the place. She has depression/anxiety but she feels this is stable.  Plan: 1. We extensively discussed the reasons for her hypersomnia. 2. D/w her regarding 5 rules to sleep hygiene. 3. She needs to be compliant with cpap for the next month. 4. Suggest she gets  switched from zoloft to trazodone to help her sleep at night. I suggested remeron but she did not gain weight. 5. May need modafinil if she remains sleepy and tired despite using the cpap and following rules of good sleep hygiene.  6. Will need ABG -- R/O hypercapnea causing sleepiness.   Insomnia 2/2 poor sleep hygiene. D/w pt re sleep hygiene/rules. Suggest trazodone   I spent at least 40 minutes with the patient today and more than 50% was spent counseling the patient regarding disease and management and facilitating labs and medications.    Return to clinic in6-8 weeks  J. Shirl Harris, MD 06/19/2015, 11:06 PM [facility] Pager [phone] After 3 pm or if no answer, call [phone]

## 2015-06-19 NOTE — Assessment & Plan Note (Signed)
Pt with depression. On zoloft. Suggest switching to a sedating antidepressant. Maybe remeron. Maybe trazodone. Told pt to let pcp know.

## 2015-06-19 NOTE — Assessment & Plan Note (Signed)
Pt has osa, unknown severity. On autocpap. DL the last month 50%, AHI < 5. Needs to use cpap everytime she sleeps. Needs a 1 month DL.

## 2015-06-19 NOTE — Assessment & Plan Note (Signed)
2/2 poor sleep hygiene. D/w pt re sleep hygiene/rules. Suggest trazodone

## 2015-06-19 NOTE — Patient Instructions (Signed)
  5 Rules of Good Sleep Hygiene: 1. Go to bed only when sleepy. 2. Try to wake up at the same time everyday.  3. If you are not able to fall asleep within the hour, get up and do something boring and monotonous.  Continue doing this until you get sleepy and you fall asleep.  4. Avoid naps. 5. The bed should just be used for sleep and intimacy.   We will order you cpap supplies (nasal mask with chin strap). Use your cpap everytime you sleep.   Return to clinic in 2 mos.

## 2015-06-20 ENCOUNTER — Encounter (HOSPITAL_COMMUNITY)
Admission: RE | Admit: 2015-06-20 | Discharge: 2015-06-20 | Disposition: A | Discharge status: Home / Self Care | Payer: BLUE CROSS/BLUE SHIELD | Source: Ambulatory Visit | Attending: Internal Medicine | Admitting: Internal Medicine

## 2015-06-20 DIAGNOSIS — J449 Chronic obstructive pulmonary disease, unspecified: Secondary | ICD-10-CM | POA: Diagnosis not present

## 2015-06-21 DIAGNOSIS — G4733 Obstructive sleep apnea (adult) (pediatric): Secondary | ICD-10-CM | POA: Diagnosis not present

## 2015-06-24 NOTE — Addendum Note (Signed)
Encounter addended by: Cathie Olden, RN on: 06/24/2015  7:34 AM<BR>     Documentation filed: Notes Section

## 2015-06-24 NOTE — Progress Notes (Signed)
Pulmonary Rehabilitation Program Outcomes Report   Orientation:  02/28/15 Graduate Date:  tbd Discharge Date:  tbd # of sessions completed: 18  Pulmonologist: Overton MD:  Bland Span Time:  [id number]  A.  Exercise Program:  Tolerates exercise @ 3.57 METS for 15 minutes  B.  Mental Health:  Good mental attitude  C.  Education/Instruction/Skills  Uses Perceived Exertion Scale and/or Dyspnea Scale  Demonstrates accurate pursed lip breathing  D.  Nutrition/Weight Control/Body Composition:  Adherence to prescribed nutrition program: fair    E.  Blood Lipids   No results found for: CHOL, HDL, LDLCALC, LDLDIRECT, TRIG, CHOLHDL  F.  Lifestyle Changes:  Making positive lifestyle changes  G.  Symptoms noted with exercise:  Asymptomatic  Report Completed By:  Stevphen Rochester RN   Comments:  This is the patients halfway progress note for [facility]. Patient is progressing well.

## 2015-06-24 NOTE — Progress Notes (Addendum)
Alicia Hall 60 y.o. female  120 day Psychosocial Note   Patient psychosocial assessment reveals no barriers to participation in Pulmonary Rehab. Psychosocial areas that are currently affecting patient's rehab experience include concerns about family, rating a 5 on stress level and is continuing to get better. Patient does continue to exhibit positive coping skills to deal with her psychosocial concerns. Offered emotional support and reassurance. Patient does feel she is making progress toward Pulmonary Rehab goals. Patient reports her health and activity level has improved some in the past 30 days States SOB is getting better. Patient states family/friends have noticed changes in her activity or mood. Patient reports feeling positive about current and projected progression in Pulmonary Rehab. After reviewing the patient's treatment plan, the patient is making progress toward Pulmonary Rehab goals. Patient's rate of progress toward rehab goals is good. Plan of action to help patient continue to work towards rehab goals include encouragement, support and education. Will continue to monitor and evaluate progress toward psychosocial goal(s).  Goal(s) in progress:  Improved management of stress  Improved coping skills  Help patient work toward returning to meaningful activities that improve patient's QOL and are attainable with patient's lung disease  Lose weight  Breathe better

## 2015-06-25 ENCOUNTER — Encounter (HOSPITAL_COMMUNITY)
Admission: RE | Admit: 2015-06-25 | Discharge: 2015-06-25 | Disposition: A | Discharge status: Home / Self Care | Payer: BLUE CROSS/BLUE SHIELD | Source: Ambulatory Visit | Attending: Internal Medicine | Admitting: Internal Medicine

## 2015-06-25 DIAGNOSIS — J449 Chronic obstructive pulmonary disease, unspecified: Secondary | ICD-10-CM | POA: Insufficient documentation

## 2015-06-27 ENCOUNTER — Encounter (HOSPITAL_COMMUNITY): Payer: BLUE CROSS/BLUE SHIELD

## 2015-07-02 ENCOUNTER — Encounter (HOSPITAL_COMMUNITY)
Admission: RE | Admit: 2015-07-02 | Discharge: 2015-07-02 | Disposition: A | Discharge status: Home / Self Care | Payer: BLUE CROSS/BLUE SHIELD | Source: Ambulatory Visit | Attending: Internal Medicine | Admitting: Internal Medicine

## 2015-07-02 DIAGNOSIS — J449 Chronic obstructive pulmonary disease, unspecified: Secondary | ICD-10-CM | POA: Diagnosis not present

## 2015-07-04 ENCOUNTER — Encounter (HOSPITAL_COMMUNITY)
Admission: RE | Admit: 2015-07-04 | Discharge: 2015-07-04 | Disposition: A | Discharge status: Home / Self Care | Payer: BLUE CROSS/BLUE SHIELD | Source: Ambulatory Visit | Attending: Internal Medicine | Admitting: Internal Medicine

## 2015-07-04 DIAGNOSIS — J449 Chronic obstructive pulmonary disease, unspecified: Secondary | ICD-10-CM | POA: Diagnosis not present

## 2015-07-05 ENCOUNTER — Ambulatory Visit (INDEPENDENT_AMBULATORY_CARE_PROVIDER_SITE_OTHER): Payer: BLUE CROSS/BLUE SHIELD | Admitting: Internal Medicine

## 2015-07-05 ENCOUNTER — Encounter: Payer: Self-pay | Admitting: Internal Medicine

## 2015-07-05 VITALS — BP 136/62 | HR 58 | Ht 62.5 in | Wt 184.0 lb

## 2015-07-05 DIAGNOSIS — J449 Chronic obstructive pulmonary disease, unspecified: Secondary | ICD-10-CM | POA: Insufficient documentation

## 2015-07-05 DIAGNOSIS — R0600 Dyspnea, unspecified: Secondary | ICD-10-CM | POA: Diagnosis not present

## 2015-07-05 DIAGNOSIS — R911 Solitary pulmonary nodule: Secondary | ICD-10-CM

## 2015-07-05 DIAGNOSIS — R0689 Other abnormalities of breathing: Secondary | ICD-10-CM

## 2015-07-05 NOTE — Patient Instructions (Addendum)
ICD-9-CM ICD-10-CM   1. Dyspnea and respiratory abnormality 786.09 R06.00     R06.89   2. COPD, moderate ([place]) 496 J44.9   3. Lung nodule 793.11 R91.1    #Shortness of breath  - Glad you're better with rehabilitation but too bad that is still some shortness of breath - Do pulmonary stress test on the bike in October 2017 prior to next visit  #Lung nodules -Do one year follow-up CT chest October 2017; pre-existing orders should be that  *COPD moderate - Currently stable disease continue inhalers of Spiriva and Dulera for now - If there is significant cough suggest to stop fish oil  #Follow-up - After pulmonary stress test and CT chest in October 2017

## 2015-07-05 NOTE — Progress Notes (Signed)
Subjective:     Patient ID: CATALYA SHOOK, female   DOB: 04-Jul-1955, 60 y.o.   MRN: [id number]  HPI PCP .Gar Ponto, MD  HPI  IOV 11/23/2014  Chief Complaint  Patient presents with  . pulmonary consult    pt. states she has SOB. chest sore at times. pt. denies chest tightness, prod. cough clear in color.   60 year old follow-up heavy smoker referred by Dr. Ishmael Holter. History is provided by the patient and review of the outside chart. As best as I can gather patient has a long-standing history of chronic cough, wheezing, shortness of breath. Shortness of breath is the dominant symptom. This is been going on for several years. According to the patient's brother 2 years ago she was extremely sick with the symptoms that family thought she might die. Year 2015 was about a year. But again in the year 2016 symptoms have persisted and are getting worse slowly. The particular he was in the last several months. A year ago she started seeing Dr. Ishmael Holter. According to the patient's history Symbicort was changed to [facility] a year ago but did not help. Then somewhere along the way Spiriva was added. But she still has symptoms. She describes symptoms getting worse with heat and humidity and with exertion but also relieved by rest and by albuterol nebulizers. There is one outside note 09/13/2014 from Dr. Ishmael Holter that suggest that the patient history is likely accurate.  Currently for the last few months have asthma control questionnaire shows significant amount of symptoms. At night she wakes up a few times. On waking up in the morning she is quite severe symptoms which she thinks is because of asthma. She is also very limited in her activities because of asthma. She describes a great deal of shortness of breath because of asthma. And is wheezing most of the time because of asthma and is using albuterol nebulizer for rescue at least 4 times a day. This brings to 5. question an average score to 4 that shows high activity  of symptoms or poor control if she indeed has asthma  60 yo former smoker seen for pulmonary consult on 11/23/14 with Dr. Chase Caller for dyspnea.   12/18/2014 Follow up : COPD /Asthma  Pt presents for a follow up for COPD ./Asthma   Pt was seen for pulmonary consult 1 month ago .  CT chest on on October 7 showed mild emphysema with diffuse bronchial wall thickening, mild bronchiectasis with some mild tree-in-bud opacities. There were some scattered tiny pulmonary nodules, the largest at 4 mm. There was also some noted. Atherosclerosis, PFT on 11/30/14 showed post BD FEV1 53%, ratio 67, +BD ressponse, FVC 61%, DLCO 74.  I reviewed these results with the patient and her husband. She is currently on [place]. Currently she feels her breathing is at baseline. She has an ongoing dry cough. She does get short of breath with activity. Shortness of breath seems to be worse first thing in the morning when she gets up. Patient does have a family history of COPD. Patient's BMI is 37.  Exhaled nitric oxide test. Last visit was only 17. She is maintained on Prilosec and Zantac for possible reflux She is on Zyrtec and dymista  nasal spray for postnasal drip. Says that she was on lisinopril about a year ago and this was change, which did help her cough some. He was also changed off of atenolol but helps slightly. She is now on Hyzaar. Patient denies any hemoptysis,  chest pain, orthopnea, PND, or increased leg swelling. She has no known heart disease.   OV 03/08/2015  Chief Complaint  Patient presents with  . Follow-up    Pt states her breathing is unchanged since last OV. Pt c/o cough with little mucus production - clear in color. Pt denies CP/tightness.     60 year old obese female with dyspnea out of proportion to obstructive lung disease treatment. She is here for follow-up after see my nurse practitioner in October 2016. This is a follow-up to see effects of pulmonary rehabilitation on  dyspnea. However there was a delayed started pulmonary rehabilitation due to insurance which. She has only completed 2 sessions with pulmonary rehabilitation. She still has 2-3 months ago. She underwent pulmonary rehabilitation at [facility]. Currently she feels stable. There are no new issues. She is complaint with her medications below.  OV 07/05/2015  Chief Complaint  Patient presents with  . Follow-up    breathing is doing better.  no concerns.   Follow-up  - Multiple lung nodules with largest 4 mm: She has 1 yera CT scan of the chest, not October 2017  - Moderate COPD: She is on triple inhaler therapy. She still has some cough. It is noted that she is on fish oil. Overall stable disease   - Out of proportion dyspnea: She has been attending pulmonary rehabilitation at [facility] since January 2017 and this has helped but only somewhat. She still has residual dyspnea that she feels is somewhat significant. -      has a past medical history of Hypertension; Asthma; Seasonal allergies; OSA (obstructive sleep apnea); GERD (gastroesophageal reflux disease); and COPD (chronic obstructive pulmonary disease) ([place]).   reports that she quit smoking about 20 years ago. Her smoking use included Cigarettes. She has a 37.5 pack-year smoking history. She does not have any smokeless tobacco history on file.  Past Surgical History  Procedure Laterality Date  . Colonoscopy N/A 01/31/2015    RMR: Multiple colonic polyps treated/removed as described above  . Esophagogastroduodenoscopy N/A 01/31/2015    RMR: mild changes of reflux esophagitis. Tiny hiatal hernia. Status post passage of a Maloney dilator.   Venia Minks dilation N/A 01/31/2015    Procedure: Venia Minks DILATION;  Surgeon: Daneil Dolin, MD;  Location: [facility];  Service: Endoscopy;  Laterality: N/A;    Allergies  Allergen Reactions  . Levaquin [Levofloxacin]     nausea  . Lisinopril Cough    Immunization History  Administered  Date(s) Administered  . Influenza,inj,Quad PF,36+ Mos 12/18/2014  . Tdap 02/23/2014  . Zoster 02/23/2014    Family History  Problem Relation Age of Onset  . Emphysema Father   . Emphysema Mother   . Lung cancer Maternal Grandfather   . Heart attack Maternal Grandmother      Current outpatient prescriptions:  .  albuterol (PROVENTIL HFA;VENTOLIN HFA) 108 (90 BASE) MCG/ACT inhaler, Inhale 2 puffs into the lungs every 6 (six) hours as needed for wheezing or shortness of breath., Disp: , Rfl:  .  albuterol (PROVENTIL) (2.5 MG/3ML) 0.083% nebulizer solution, Take 2.5 mg by nebulization every 6 (six) hours as needed for wheezing or shortness of breath., Disp: , Rfl:  .  amLODipine (NORVASC) 10 MG tablet, Take 10 mg by mouth daily., Disp: , Rfl:  .  Azelastine-Fluticasone (DYMISTA) 137-50 MCG/ACT SUSP, Place 1 spray into the nose 2 (two) times daily., Disp: 6 g, Rfl: 0 .  cetirizine (ZYRTEC) 10 MG tablet, Take 10 mg by  mouth daily., Disp: , Rfl:  .  losartan-hydrochlorothiazide (HYZAAR) 100-12.5 MG tablet, Take 1 tablet by mouth daily., Disp: , Rfl:  .  mometasone-formoterol (DULERA) 200-5 MCG/ACT AERO, Inhale 2 puffs into the lungs 2 (two) times daily at 10 AM and 5 PM., Disp: 8.8 g, Rfl: 1 .  montelukast (SINGULAIR) 10 MG tablet, Take 10 mg by mouth daily., Disp: , Rfl:  .  Multiple Vitamins-Minerals (MULTIVITAMIN WITH MINERALS) tablet, Take 1 tablet by mouth daily., Disp: , Rfl:  .  norethindrone (AYGESTIN) 5 MG tablet, Take 2.5 mg by mouth daily., Disp: , Rfl:  .  Omega-3 Fatty Acids (FISH OIL) 1000 MG CAPS, Take 2 capsules by mouth daily., Disp: , Rfl:  .  pantoprazole (PROTONIX) 40 MG tablet, Take 1 tablet (40 mg total) by mouth daily., Disp: 30 tablet, Rfl: 3 .  ranitidine (ZANTAC) 150 MG tablet, Take 150 mg by mouth 2 (two) times daily., Disp: , Rfl:  .  Red Yeast Rice 600 MG CAPS, Take 2 capsules by mouth daily., Disp: , Rfl:  .  sertraline (ZOLOFT) 100 MG tablet, Take 100 mg by mouth  daily., Disp: , Rfl:  .  tiotropium (SPIRIVA HANDIHALER) 18 MCG inhalation capsule, Place 1 capsule (18 mcg total) into inhaler and inhale daily., Disp: 30 capsule, Rfl: 3    Review of Systems     Objective:   Physical Exam  Constitutional: She is oriented to person, place, and time. She appears well-developed and well-nourished. No distress.  HENT:  Head: Normocephalic and atraumatic.  Right Ear: External ear normal.  Left Ear: External ear normal.  Mouth/Throat: Oropharynx is clear and moist. No oropharyngeal exudate.  Eyes: Conjunctivae and EOM are normal. Pupils are equal, round, and reactive to light. Right eye exhibits no discharge. Left eye exhibits no discharge. No scleral icterus.  Neck: Normal range of motion. Neck supple. No JVD present. No tracheal deviation present. No thyromegaly present.  Cardiovascular: Normal rate, regular rhythm, normal heart sounds and intact distal pulses.  Exam reveals no gallop and no friction rub.   No murmur heard. Pulmonary/Chest: Effort normal and breath sounds normal. No respiratory distress. She has no wheezes. She has no rales. She exhibits no tenderness.  Abdominal: Soft. Bowel sounds are normal. She exhibits no distension and no mass. There is no tenderness. There is no rebound and no guarding.  Musculoskeletal: Normal range of motion. She exhibits no edema or tenderness.  Lymphadenopathy:    She has no cervical adenopathy.  Neurological: She is alert and oriented to person, place, and time. She has normal reflexes. No cranial nerve deficit. She exhibits normal muscle tone. Coordination normal.  Skin: Skin is warm and dry. No rash noted. She is not diaphoretic. No erythema. No pallor.  Psychiatric: She has a normal mood and affect. Her behavior is normal. Judgment and thought content normal.  Vitals reviewed.   Filed Vitals:   07/05/15 1351  BP: 136/62  Pulse: 58  Height: 5' 2.5" (1.588 m)  Weight: 184 lb (83.462 kg)  SpO2: 95%    Estimated body mass index is 33.1 kg/(m^2) as calculated from the following:   Height as of this encounter: 5' 2.5" (1.588 m).   Weight as of this encounter: 184 lb (83.462 kg).       Assessment:       ICD-9-CM ICD-10-CM   1. Dyspnea and respiratory abnormality 786.09 R06.00 Cardiopulmonary exercise test    R06.89   2. COPD, moderate ([place]) 496 J44.9 Cardiopulmonary  exercise test  3. Lung nodule 793.11 R91.1 Cardiopulmonary exercise test       Plan:      #Shortness of breath  - Glad you're better with rehabilitation but too bad that is still some shortness of breath - Do pulmonary stress test on the bike in October 2017 prior to next visit  #Lung nodules -Do one year follow-up CT chest October 2017; pre-existing orders should be that  *COPD moderate - Currently stable disease continue inhalers of Spiriva and Dulera for now - If there is significant cough suggest to stop fish oil  #Follow-up - After pulmonary stress test and CT chest in October 2017     Dr. Brand Males, M.D., F.C.C.P Pulmonary and Critical Care Medicine Staff Physician [place] [facility] Pager: [phone], If no answer or between  15:00h - 7:00h: call [phone]  07/05/2015 2:23 PM

## 2015-07-09 ENCOUNTER — Encounter (HOSPITAL_COMMUNITY)
Admission: RE | Admit: 2015-07-09 | Discharge: 2015-07-09 | Disposition: A | Discharge status: Home / Self Care | Payer: BLUE CROSS/BLUE SHIELD | Source: Ambulatory Visit | Attending: Internal Medicine | Admitting: Internal Medicine

## 2015-07-09 DIAGNOSIS — J449 Chronic obstructive pulmonary disease, unspecified: Secondary | ICD-10-CM | POA: Diagnosis not present

## 2015-07-11 ENCOUNTER — Encounter (HOSPITAL_COMMUNITY)
Admission: RE | Admit: 2015-07-11 | Discharge: 2015-07-11 | Disposition: A | Discharge status: Home / Self Care | Payer: BLUE CROSS/BLUE SHIELD | Source: Ambulatory Visit | Attending: Internal Medicine | Admitting: Internal Medicine

## 2015-07-11 DIAGNOSIS — J449 Chronic obstructive pulmonary disease, unspecified: Secondary | ICD-10-CM | POA: Diagnosis not present

## 2015-07-12 NOTE — Progress Notes (Signed)
Alicia Hall 61 y.o. female  150 day Psychosocial Note  Patient psychosocial assessment reveals no barriers to participation in Pulmonary Rehab. Psychosocial areas that are currently affecting patient's rehab experience include concerns about family, rating a 5 on stress level at orientation and is continuing to get better. Patient does continue to exhibit positive coping skills to deal with her psychosocial concerns. Offered emotional support and reassurance. Patient does feel she is making progress toward Pulmonary Rehab goals. Patient reports her health and activity level has improved in the past 30 days States SOB is getting better. Patient states family/friends have noticed changes in her activity or mood. Patient reports feeling positive about current and projected progression in Pulmonary Rehab. After reviewing the patient's treatment plan, the patient is making progress toward Pulmonary Rehab goals. Patient's rate of progress toward rehab goals is excellent. Plan of action to help patient continue to work towards rehab goals include encouragement, support and education. Will continue to monitor and evaluate progress toward psychosocial goal(s). Patient has lost 9 lbs since starting program.   Goal(s) in progress:  Improved management of stress  Improved coping skills  Help patient work toward returning to meaningful activities that improve patient's QOL and are attainable with patient's lung disease  Lose weight  Breathe better

## 2015-07-16 ENCOUNTER — Encounter (HOSPITAL_COMMUNITY)
Admission: RE | Admit: 2015-07-16 | Discharge: 2015-07-16 | Disposition: A | Discharge status: Home / Self Care | Payer: BLUE CROSS/BLUE SHIELD | Source: Ambulatory Visit | Attending: Internal Medicine | Admitting: Internal Medicine

## 2015-07-16 DIAGNOSIS — J449 Chronic obstructive pulmonary disease, unspecified: Secondary | ICD-10-CM | POA: Diagnosis not present

## 2015-07-16 NOTE — Progress Notes (Signed)
Daily Session Note  Patient Details  Name: Alicia Hall MRN: [id number] Date of Birth: 1955-07-27 Referring Provider:    Encounter Date: 07/16/2015  Check In:     Session Check In - 07/16/15 1045    Check-In   Location [facility]   Staff Present Russella Dar, MS, EP, [facility], Exercise Physiologist   Supervising physician immediately available to respond to emergencies See telemetry face sheet for immediately available MD   Medication changes reported     No   Fall or balance concerns reported    No   Warm-up and Cool-down Performed as group-led instruction   Resistance Training Performed Yes   VAD Patient? No   Pain Assessment   Currently in Pain? No/denies      Capillary Blood Glucose: No results found for this or any previous visit (from the past 24 hour(s)).   Goals Met:  Proper associated with RPD/PD & O2 Sat Independence with exercise equipment Improved SOB with ADL's Using PLB without cueing & demonstrates good technique Exercise tolerated well No report of cardiac concerns or symptoms Strength training completed today  Goals Unmet:  RPE BP O2 Sat  Comments: Patient is progressing appropriately   Dr. Kate Sable is Medical Director for [place] and Pulmonary Rehab.

## 2015-07-18 ENCOUNTER — Encounter (HOSPITAL_COMMUNITY)
Admission: RE | Admit: 2015-07-18 | Discharge: 2015-07-18 | Disposition: A | Discharge status: Home / Self Care | Payer: BLUE CROSS/BLUE SHIELD | Source: Ambulatory Visit | Attending: Internal Medicine | Admitting: Internal Medicine

## 2015-07-18 DIAGNOSIS — J449 Chronic obstructive pulmonary disease, unspecified: Secondary | ICD-10-CM | POA: Diagnosis not present

## 2015-07-18 NOTE — Progress Notes (Signed)
Daily Session Note  Patient Details  Name: SHAQUELLA STAMANT MRN: [id number] Date of Birth: [birth year] Referring Provider:    Encounter Date: 07/18/2015  Check In:     Session Check In - 07/18/15 1045    Check-In   Location [facility]   Staff Present Jonice Cerra Angelina Pih, MS, EP, [facility], Exercise Physiologist;Other  Nils Flack, EP, Phylliss Blakes, BSN   Supervising physician immediately available to respond to emergencies See telemetry face sheet for immediately available MD   Medication changes reported     No   Fall or balance concerns reported    No   Warm-up and Cool-down Performed as group-led instruction   Resistance Training Performed Yes   VAD Patient? No   Pain Assessment   Currently in Pain? No/denies   Multiple Pain Sites No      Capillary Blood Glucose: No results found for this or any previous visit (from the past 24 hour(s)).   Goals Met:  Proper associated with RPD/PD & O2 Sat Independence with exercise equipment Improved SOB with ADL's Exercise tolerated well No report of cardiac concerns or symptoms Strength training completed today  Goals Unmet:  RPE PD BP HR O2 Sat  Comments: Patient is progressing appropriately. Check out 12pm   Dr. Kate Sable is Medical Director for [facility] Cardiac and Pulmonary Rehab.

## 2015-07-23 ENCOUNTER — Encounter (HOSPITAL_COMMUNITY)
Admission: RE | Admit: 2015-07-23 | Discharge: 2015-07-23 | Disposition: A | Discharge status: Home / Self Care | Payer: BLUE CROSS/BLUE SHIELD | Source: Ambulatory Visit | Attending: Internal Medicine | Admitting: Internal Medicine

## 2015-07-23 DIAGNOSIS — J449 Chronic obstructive pulmonary disease, unspecified: Secondary | ICD-10-CM | POA: Diagnosis not present

## 2015-07-23 NOTE — Progress Notes (Signed)
Daily Session Note  Patient Details  Name: Alicia Hall MRN: [id number] Date of Birth: 01-05-1956 Referring Provider:    Encounter Date: 07/23/2015  Check In:     Session Check In - 07/23/15 1827    Check-In   Location [facility]   Staff Present Russella Dar, MS, EP, [facility], Exercise Physiologist;Other  Nils Flack, EP   Supervising physician immediately available to respond to emergencies See telemetry face sheet for immediately available MD   Medication changes reported     No   Fall or balance concerns reported    No   Warm-up and Cool-down Performed as group-led instruction   Resistance Training Performed Yes   VAD Patient? No   Pain Assessment   Currently in Pain? No/denies   Multiple Pain Sites No      Capillary Blood Glucose: No results found for this or any previous visit (from the past 24 hour(s)).   Goals Met:  Proper associated with RPD/PD & O2 Sat Independence with exercise equipment Improved SOB with ADL's Using PLB without cueing & demonstrates good technique Exercise tolerated well No report of cardiac concerns or symptoms Strength training completed today  Goals Unmet:  RPE PD BP HR O2 Sat  Comments: Check out: 11:45   Dr. Kate Sable is Medical Director for [place] and Pulmonary Rehab.

## 2015-07-25 ENCOUNTER — Encounter (HOSPITAL_COMMUNITY): Payer: BLUE CROSS/BLUE SHIELD

## 2015-07-26 DIAGNOSIS — Z6833 Body mass index (BMI) 33.0-33.9, adult: Secondary | ICD-10-CM | POA: Diagnosis not present

## 2015-07-26 DIAGNOSIS — G4733 Obstructive sleep apnea (adult) (pediatric): Secondary | ICD-10-CM | POA: Diagnosis not present

## 2015-07-26 DIAGNOSIS — J309 Allergic rhinitis, unspecified: Secondary | ICD-10-CM | POA: Diagnosis not present

## 2015-07-26 DIAGNOSIS — R05 Cough: Secondary | ICD-10-CM | POA: Diagnosis not present

## 2015-07-30 ENCOUNTER — Encounter (HOSPITAL_COMMUNITY): Payer: BLUE CROSS/BLUE SHIELD

## 2015-08-01 ENCOUNTER — Encounter (HOSPITAL_COMMUNITY): Payer: BLUE CROSS/BLUE SHIELD

## 2015-08-06 ENCOUNTER — Encounter (HOSPITAL_COMMUNITY)
Admission: RE | Admit: 2015-08-06 | Discharge: 2015-08-06 | Disposition: A | Discharge status: Home / Self Care | Payer: BLUE CROSS/BLUE SHIELD | Source: Ambulatory Visit | Attending: Internal Medicine | Admitting: Internal Medicine

## 2015-08-06 DIAGNOSIS — J449 Chronic obstructive pulmonary disease, unspecified: Secondary | ICD-10-CM | POA: Diagnosis not present

## 2015-08-06 NOTE — Progress Notes (Signed)
Daily Session Note  Patient Details  Name: Alicia Hall MRN: [id number] Date of Birth: 09/17/1955 Referring Provider:    Encounter Date: 08/06/2015  Check In:     Session Check In - 08/06/15 1118    Check-In   Location [facility]   Staff Present Diane Coad, MS, EP, [facility], Exercise Physiologist;Christy Edwards, RN, BSN   Supervising physician immediately available to respond to emergencies See telemetry face sheet for immediately available ER MD   Medication changes reported     No   Fall or balance concerns reported    No   Warm-up and Cool-down Performed as group-led instruction   Resistance Training Performed Yes   VAD Patient? No   Pain Assessment   Currently in Pain? No/denies      Capillary Blood Glucose: No results found for this or any previous visit (from the past 24 hour(s)).   Goals Met:  Proper associated with RPD/PD & O2 Sat Independence with exercise equipment Using PLB without cueing & demonstrates good technique Exercise tolerated well No report of cardiac concerns or symptoms Strength training completed today  Goals Unmet:  Not Applicable  Comments: check out 1145   Dr. Suresh Koneswaran is Medical Director for [place] [facility] and Pulmonary Rehab. 

## 2015-08-17 NOTE — Progress Notes (Signed)
Patient graduated from Pulmonary Rehabilitation today on 08/06/15 after completing 36 sessions. He achieved LTG of 30 minutes of aerobic exercise at Max Met level of 3.58. All patients vitals are WNL. Patient did not meet with dietician. Discharge instruction has been reviewed in detail and patient stated an understanding of material given. Patient plans to join our [employer] class 2 times week and to exercise at home on the days she is not attending [facility]. Pulmonary Rehab staff will make f/u calls at 1 month, 6 months, and 1 year. Patient had no complaints of any abnormal S/S or pain on their exit visit.

## 2015-08-18 NOTE — Progress Notes (Signed)
[facility] Pulmonary Rehabilitation                                                             Final/Discharge Outcome Results  Anthropometrics: . Height (inches): 62.5 . Weight (kg): 85 ? This is a change of -2.51 from entrance. . Grip strength was measured using a Dynamometer.  The patient's discharge score was a 51.   ? This is a change of -7.2 from entrance.  Functional Status/Exercise Capacity: . Alicia Hall had a resting heart rate of 82 BPM, a resting blood pressure of 106/56, and an oxygen saturation of 96 % on 0 liters of O2.  Alicia Hall performed a discharge 6-minute walk test on 08/06/15.  The patient completed 1100 feet in 6 minutes with 0 rest breaks.  This quantifies 2.1 METS. The patient's goal is to add 82 feet onto the baseline 6MWT.   ? The patient increased their 6-minute walk test distance by -4.34 feet and their MET level by -3.6 METs.  Dyspnea Measures: . The Alicia Hall is a simple and standardized method of classifying disability in patients with COPD.  The assessment correlates disability and dyspnea.  Upon discharge the patients resting score was 3. The scale is provided below.  ? This is a change of 0 from entrance.  0= I only get breathless with strenuous exercise. 1= I get short of breath when hurrying on level ground or walking up a slight incline. 2= On level ground, I walk slower than people of the same age because of breathlessness, or have to stop for breath when walking at my own pace. 3= I stop for breath after walking 100 yards or after a few minutes on level ground. 4=I am too breathless to leave the house or I am breathless when dressing.   . The patient completed the Alicia ([facility] [place]).  This questionnaire relates activities of daily living and shortness of breath.  The score ranges from 0-120, a higher score relates to severe shortness of breath during activities of  daily living. The patient's score at discharge was 40. ? This is a change of -6.97 from entrance.  Quality of Life: . Alicia Hall Pulmonary Version is used to assess the patients satisfaction in different domains of their life; health and functioning, socioeconomic, psychological/spiritual, and family. The overall score is recorded out of 30 points.  The patient's goal is to achieve an overall score of 21 or higher.  Alicia Hall received a 23.95 upon discharge.  ? This is a change of -2.77 from entrance.  . The Patient Health Questionnaire (PHQ-2) is a first step approach for the screening of depression.  If the patient scores positive on the PHQ-2 the patient should be further assessed with the PHQ-9.  The Patient Health Questionnaire (PHQ-9) assesses the degree of depression.  Depression is important to monitor and track in pulmonary patients due to its prevalence in the population.  If the patient advances to the PHQ-9 the goal is to score less than 4 on this assessment.  Alicia Hall scored a 4 at discharge. ? This is a change of 0 from entrance.   Clinical Assessment Tools: . The COPD Assessment Test (CAT) is a measurement tool to quantify how  much of an impact the disease has on the patient's life.  This assessment aids the Pulmonary Rehab Team in designing the patients individualized treatment plan.  A CAT score ranges from 0-40.  A score of 10 or below indicates that COPD has a low impact on the patient's life whereas a score of 30 or higher indicates a severe impact. The patient's goal is a decrease of 1 point from entrance to discharge.  Alicia Hall had a CAT score of 9 upon discharge.   ? This is a change of -64 from entrance.  Nutrition: . The "Rate My Plate" is a dietary assessment that quantifies the balance of a patient's diet.  This tool allows the Pulmonary Rehab Team to key in on the areas of the patient's diet that needs improving.  The team can then focus their  nutritional education on those areas.  If the patient scores 24-40, this means there are many ways they can make their eating habits healthier, 41-57 states that there are some ways they can make their eating habits healthier and a score of 58-72 states that they are making many healthy choices.  The patient's goal is to achieve a score of 49 or higher on this assessment.  Alicia Hall scored a 59 upon discharge.   ? This is a change of 26.66 from entrance.  Oxygen Compliance: . Patient is currently on 0 liters at rest, 0 liters at night, and 0 liters for exercise.  Alicia Hall is currently using cpap at night.  The patient is currently compliant. The patient states that they do not have barriers that keep them from using their oxygen.  These barriers include none.   ? This is no change result from entrance.    Education: . Alicia Hall attended 10/13 education classes.  Alicia Hall completed a discharge educational assessment and achieved a score of 10/14.  ? This is a change of 11.1 from entrance.   Smoking Cessation:  Patient does not smoke.   Exercise: . Alicia Hall was provided with an individualized Home Exercise Prescription (HEP) at the entrance of the program.  The patient's goal is to be exercising 30-60 minutes, 3-5 days per week. Upon discharge the patient is exercising 2 days at home.  This is a change of 100% from entrance.  After graduation from Pulmonary Rehab, Alicia Hall will continue exercising at [facility] in our [place].

## 2015-08-19 DIAGNOSIS — Z6834 Body mass index (BMI) 34.0-34.9, adult: Secondary | ICD-10-CM | POA: Diagnosis not present

## 2015-08-19 DIAGNOSIS — L562 Photocontact dermatitis [berloque dermatitis]: Secondary | ICD-10-CM | POA: Diagnosis not present

## 2015-09-03 ENCOUNTER — Ambulatory Visit: Payer: BLUE CROSS/BLUE SHIELD | Admitting: Pulmonary Disease

## 2015-09-19 DIAGNOSIS — Z6834 Body mass index (BMI) 34.0-34.9, adult: Secondary | ICD-10-CM | POA: Diagnosis not present

## 2015-09-19 DIAGNOSIS — L0291 Cutaneous abscess, unspecified: Secondary | ICD-10-CM | POA: Diagnosis not present

## 2015-09-19 DIAGNOSIS — M1711 Unilateral primary osteoarthritis, right knee: Secondary | ICD-10-CM | POA: Diagnosis not present

## 2015-10-04 DIAGNOSIS — I10 Essential (primary) hypertension: Secondary | ICD-10-CM | POA: Diagnosis not present

## 2015-10-04 DIAGNOSIS — K210 Gastro-esophageal reflux disease with esophagitis: Secondary | ICD-10-CM | POA: Diagnosis not present

## 2015-10-04 DIAGNOSIS — E782 Mixed hyperlipidemia: Secondary | ICD-10-CM | POA: Diagnosis not present

## 2015-10-09 DIAGNOSIS — Z1212 Encounter for screening for malignant neoplasm of rectum: Secondary | ICD-10-CM | POA: Diagnosis not present

## 2015-10-09 DIAGNOSIS — E782 Mixed hyperlipidemia: Secondary | ICD-10-CM | POA: Diagnosis not present

## 2015-10-09 DIAGNOSIS — E6609 Other obesity due to excess calories: Secondary | ICD-10-CM | POA: Diagnosis not present

## 2015-10-09 DIAGNOSIS — Z0001 Encounter for general adult medical examination with abnormal findings: Secondary | ICD-10-CM | POA: Diagnosis not present

## 2015-11-04 ENCOUNTER — Telehealth: Payer: Self-pay | Admitting: Internal Medicine

## 2015-11-04 NOTE — Telephone Encounter (Signed)
Called spoke with pt. She states that she needs a CT, stress test and ov for 8/17. She states that no one has called to schedule any of these appointments. I scheduled her for an ov on 10/19/15. I explained to her that I would send a message to the [facility] to schedule the test. Orders are in the computer. She voiced understanding and had no further questions.   Will forward to [facility] for scheduling.

## 2015-11-05 NOTE — Telephone Encounter (Signed)
CPX is scheduled on 11/27/2015 @ 10:30 am and CT chest is scheduled at [facility] on 12/18/2015 @ 11:00am and the patient is aware of these appointments.

## 2015-11-12 ENCOUNTER — Telehealth: Payer: Self-pay | Admitting: Internal Medicine

## 2015-11-12 NOTE — Telephone Encounter (Signed)
Called and spoke with pt and she stated that she is scheduled to have CPX on 10/4 and they told her to check with her doctor to see if she should use any of her inhalers on the morning of her test.  Pt is aware that MR is out of the office right now, but we will forward this to him and get back with her once he lets [place] know.  MR please advise. thanks

## 2015-11-13 ENCOUNTER — Encounter (HOSPITAL_COMMUNITY): Payer: BLUE CROSS/BLUE SHIELD

## 2015-11-13 NOTE — Telephone Encounter (Signed)
Spoke with pt. She is aware to use her inhaler the morning of her CPX. Nothing further was needed.

## 2015-11-13 NOTE — Telephone Encounter (Signed)
She has mod copd but out of proportion dyspnea. I am trying to understand the out  Of proprottion dyspnea so best copd is optimized on day of testing; so she should take all her mdi on morning of test  Thanks  Dr. Brand Males, M.D., [facility].C.P Pulmonary and Critical Care Medicine Staff Physician [place] [facility] Pager: [phone], If no answer or between  15:00h - 7:00h: call [phone]  11/13/2015 8:43 AM

## 2015-11-18 ENCOUNTER — Telehealth: Payer: Self-pay

## 2015-11-18 NOTE — Telephone Encounter (Signed)
Patient is on currently on Spiriva, and has been receiving samples each month because she can not afford this inhaler. We are currently out of Spiriva, what else could she take or substitute for this inhaler.   Please Advise  Thanks

## 2015-11-19 NOTE — Telephone Encounter (Signed)
Please Advise Dr. Gallagher  

## 2015-11-19 NOTE — Telephone Encounter (Signed)
Discussed with Dyasia. We will give one more sample, which depletes our entire supply of Spiriva in [place]. She needs to make a follow up appointment so that we can discuss other options for her and come up with a combination of methods for paying for her medications (copay cards, patient assistance, etc). She can also get Spiriva samples from her pulmonologist.  Salvatore Marvel, MD [place] [facility]

## 2015-11-20 DIAGNOSIS — G4733 Obstructive sleep apnea (adult) (pediatric): Secondary | ICD-10-CM | POA: Diagnosis not present

## 2015-11-27 ENCOUNTER — Encounter (HOSPITAL_COMMUNITY): Payer: BLUE CROSS/BLUE SHIELD

## 2015-12-06 DIAGNOSIS — J441 Chronic obstructive pulmonary disease with (acute) exacerbation: Secondary | ICD-10-CM | POA: Diagnosis not present

## 2015-12-06 DIAGNOSIS — Z6835 Body mass index (BMI) 35.0-35.9, adult: Secondary | ICD-10-CM | POA: Diagnosis not present

## 2015-12-06 DIAGNOSIS — J209 Acute bronchitis, unspecified: Secondary | ICD-10-CM | POA: Diagnosis not present

## 2015-12-16 ENCOUNTER — Ambulatory Visit (HOSPITAL_COMMUNITY): Payer: BLUE CROSS/BLUE SHIELD | Attending: Internal Medicine

## 2015-12-16 DIAGNOSIS — R911 Solitary pulmonary nodule: Secondary | ICD-10-CM | POA: Insufficient documentation

## 2015-12-16 DIAGNOSIS — R0609 Other forms of dyspnea: Secondary | ICD-10-CM | POA: Insufficient documentation

## 2015-12-16 DIAGNOSIS — J449 Chronic obstructive pulmonary disease, unspecified: Secondary | ICD-10-CM | POA: Insufficient documentation

## 2015-12-16 DIAGNOSIS — R0689 Other abnormalities of breathing: Secondary | ICD-10-CM

## 2015-12-16 DIAGNOSIS — R0600 Dyspnea, unspecified: Secondary | ICD-10-CM

## 2015-12-18 ENCOUNTER — Ambulatory Visit (HOSPITAL_COMMUNITY)
Admission: RE | Admit: 2015-12-18 | Discharge: 2015-12-18 | Disposition: A | Discharge status: Home / Self Care | Payer: BLUE CROSS/BLUE SHIELD | Source: Ambulatory Visit | Attending: Adult Health | Admitting: Adult Health

## 2015-12-18 DIAGNOSIS — R918 Other nonspecific abnormal finding of lung field: Secondary | ICD-10-CM | POA: Diagnosis not present

## 2015-12-18 DIAGNOSIS — I700 Atherosclerosis of aorta: Secondary | ICD-10-CM | POA: Insufficient documentation

## 2015-12-18 DIAGNOSIS — I2510 Atherosclerotic heart disease of native coronary artery without angina pectoris: Secondary | ICD-10-CM | POA: Insufficient documentation

## 2015-12-18 DIAGNOSIS — R911 Solitary pulmonary nodule: Secondary | ICD-10-CM | POA: Insufficient documentation

## 2015-12-19 ENCOUNTER — Ambulatory Visit (INDEPENDENT_AMBULATORY_CARE_PROVIDER_SITE_OTHER): Payer: BLUE CROSS/BLUE SHIELD | Admitting: Internal Medicine

## 2015-12-19 ENCOUNTER — Encounter: Payer: Self-pay | Admitting: Internal Medicine

## 2015-12-19 ENCOUNTER — Ambulatory Visit: Payer: BLUE CROSS/BLUE SHIELD | Admitting: Internal Medicine

## 2015-12-19 VITALS — BP 154/72 | HR 64 | Ht 62.5 in | Wt 193.4 lb

## 2015-12-19 DIAGNOSIS — R911 Solitary pulmonary nodule: Secondary | ICD-10-CM | POA: Diagnosis not present

## 2015-12-19 DIAGNOSIS — J449 Chronic obstructive pulmonary disease, unspecified: Secondary | ICD-10-CM

## 2015-12-19 DIAGNOSIS — Z23 Encounter for immunization: Secondary | ICD-10-CM

## 2015-12-19 DIAGNOSIS — R0600 Dyspnea, unspecified: Secondary | ICD-10-CM | POA: Diagnosis not present

## 2015-12-19 DIAGNOSIS — R0689 Other abnormalities of breathing: Secondary | ICD-10-CM

## 2015-12-19 NOTE — Patient Instructions (Addendum)
ICD-9-CM ICD-10-CM   1. Dyspnea and respiratory abnormality 786.09 R06.00     R06.89   2. COPD, moderate ([place]) 496 J44.9   3. Lung nodule 793.11 R91.1    #Shortness of breath  - Glad you're better with rehabilitation but too bad that is still some shortness of breath - this residual shortness of breath is due to weight and heart muscle not relaxing well - take efforts to lose weight  #Lung nodules -CT chest October 2016 and 2017 without chanbge  - do low dose CT for followup in oct 2018  *COPD moderate - Currently stable disease continue inhalers of Spiriva and Dulera for now - flu shot 12/19/2015   #Follow-up - after CT chest in October 2018 - 1 year or sooner if needed

## 2015-12-19 NOTE — Addendum Note (Signed)
Addended by: Collier Salina on: 12/19/2015 04:24 PM   Modules accepted: Orders

## 2015-12-19 NOTE — Progress Notes (Signed)
Subjective:     Patient ID: Alicia Hall, female   DOB: 1955/04/07, 60 y.o.   MRN: [id number]  HPI   PCP .Gar Ponto, MD  HPI  IOV 11/23/2014  Chief Complaint  Patient presents with  . pulmonary consult    pt. states she has SOB. chest sore at times. pt. denies chest tightness, prod. cough clear in color.   60 year old follow-up heavy smoker referred by Dr. Ishmael Holter. History is provided by the patient and review of the outside chart. As best as I can gather patient has a long-standing history of chronic cough, wheezing, shortness of breath. Shortness of breath is the dominant symptom. This is been going on for several years. According to the patient's brother 2 years ago she was extremely sick with the symptoms that family thought she might die. Year 2015 was about a year. But again in the year 2016 symptoms have persisted and are getting worse slowly. The particular he was in the last several months. A year ago she started seeing Dr. Ishmael Holter. According to the patient's history Symbicort was changed to [facility] a year ago but did not help. Then somewhere along the way Spiriva was added. But she still has symptoms. She describes symptoms getting worse with heat and humidity and with exertion but also relieved by rest and by albuterol nebulizers. There is one outside note 09/13/2014 from Dr. Ishmael Holter that suggest that the patient history is likely accurate.  Currently for the last few months have asthma control questionnaire shows significant amount of symptoms. At night she wakes up a few times. On waking up in the morning she is quite severe symptoms which she thinks is because of asthma. She is also very limited in her activities because of asthma. She describes a great deal of shortness of breath because of asthma. And is wheezing most of the time because of asthma and is using albuterol nebulizer for rescue at least 4 times a day. This brings to 5. question an average score to 4 that shows high  activity of symptoms or poor control if she indeed has asthma  60 yo former smoker seen for pulmonary consult on 11/23/14 with Dr. Chase Caller for dyspnea.   12/18/2014 Follow up : COPD /Asthma  Pt presents for a follow up for COPD ./Asthma   Pt was seen for pulmonary consult 1 month ago .  CT chest on on October 7 showed mild emphysema with diffuse bronchial wall thickening, mild bronchiectasis with some mild tree-in-bud opacities. There were some scattered tiny pulmonary nodules, the largest at 4 mm. There was also some noted. Atherosclerosis, PFT on 11/30/14 showed post BD FEV1 53%, ratio 67, +BD ressponse, FVC 61%, DLCO 74.  I reviewed these results with the patient and her husband. She is currently on [place]. Currently she feels her breathing is at baseline. She has an ongoing dry cough. She does get short of breath with activity. Shortness of breath seems to be worse first thing in the morning when she gets up. Patient does have a family history of COPD. Patient's BMI is 37.  Exhaled nitric oxide test. Last visit was only 17. She is maintained on Prilosec and Zantac for possible reflux She is on Zyrtec and dymista  nasal spray for postnasal drip. Says that she was on lisinopril about a year ago and this was change, which did help her cough some. He was also changed off of atenolol but helps slightly. She is now on Hyzaar. Patient denies  any hemoptysis, chest pain, orthopnea, PND, or increased leg swelling. She has no known heart disease.   OV 03/08/2015  Chief Complaint  Patient presents with  . Follow-up    Pt states her breathing is unchanged since last OV. Pt c/o cough with little mucus production - clear in color. Pt denies CP/tightness.     60 year old obese female with dyspnea out of proportion to obstructive lung disease treatment. She is here for follow-up after see my nurse practitioner in October 2016. This is a follow-up to see effects of pulmonary rehabilitation  on dyspnea. However there was a delayed started pulmonary rehabilitation due to insurance which. She has only completed 2 sessions with pulmonary rehabilitation. She still has 2-3 months ago. She underwent pulmonary rehabilitation at [facility]. Currently she feels stable. There are no new issues. She is complaint with her medications below.  OV 07/05/2015  Chief Complaint  Patient presents with  . Follow-up    breathing is doing better.  no concerns.   Follow-up  - Multiple lung nodules with largest 4 mm: She has 1 yera CT scan of the chest, not October 2017  - Moderate COPD: She is on triple inhaler therapy. She still has some cough. It is noted that she is on fish oil. Overall stable disease   - Out of proportion dyspnea: She has been attending pulmonary rehabilitation at [facility] since January 2017 and this has helped but only somewhat. She still has residual dyspnea that she feels is somewhat significant. -     OV 12/19/2015  Chief Complaint  Patient presents with  . Follow-up    Pt here after CT and CPST. pt states her SOB is unchanged since last OV in 06/2015. Pt c/o prod cough with clear mucus. Pt denies CP/tightness and f/c/s.    No new complaints  Here to reviewe results  CPST 12/18/15 - Respiratory exchange ratio 1.0 and adequate. She did have obstructive spirometry at rest but no posttest bronchospasm infection bronchodilation. She had hypertensive response with evidence of distal diastolic dysfunction. Also normal. VO2 which improved when corrected for ideal body weight suggesting obesity as cause of dyspnea.   Ct Chest Wo Contrast  Result Date: 12/18/2015 CLINICAL DATA:  Congestion for 2 days. Followup of pulmonary nodule. EXAM: CT CHEST WITHOUT CONTRAST TECHNIQUE: Multidetector CT imaging of the chest was performed following the standard protocol without IV contrast. COMPARISON:  08/28/2008 FINDINGS: Cardiovascular: Aortic and branch vessel atherosclerosis. Normal  heart size, without pericardial effusion. Multivessel coronary artery atherosclerosis. Mediastinum/Nodes: No mediastinal or definite hilar adenopathy, given limitations of unenhanced CT. Lungs/Pleura: Trace left pleural fluid or thickening, new. Lower lobe predominant bronchial wall thickening. Mild centrilobular emphysema. 4 mm right upper chest a 4 mm right lower lobe pulmonary nodule on image 86/series 3 is unchanged. left upper lobe/lingular 4 mm nodule is not significantly changed on image 75/series 3. Left lower lobe 4 mm nodule on image 98/series 3 is unchanged. Immediately cephalad 4 mm nodule on image 93/series 3 is also similar. Anterior upper lobe bronchiectasis and architectural distortion is greater on the right than left and similar. Upper Abdomen: Normal imaged portions of the liver, spleen, stomach, adrenal glands. Musculoskeletal: Thoracic spondylosis. IMPRESSION: 1. Similar bilateral pulmonary nodules, which can be presumed benign given stability over greater than 1 year. 2. Similar appearance of interstitial lung disease, likely the sequelae of remote infection, including atypical etiologies. 3.  Coronary artery atherosclerosis. Aortic atherosclerosis. 4. Trace left pleural fluid or thickening, new. Electronically  Signed   By: Abigail Miyamoto M.D.   On: 12/18/2015 13:02       has a past medical history of Asthma; COPD (chronic obstructive pulmonary disease) ([place]); GERD (gastroesophageal reflux disease); Hypertension; OSA (obstructive sleep apnea); and Seasonal allergies.   reports that she quit smoking about 20 years ago. Her smoking use included Cigarettes. She has a 37.50 pack-year smoking history. She does not have any smokeless tobacco history on file.  Past Surgical History:  Procedure Laterality Date  . COLONOSCOPY N/A 01/31/2015   RMR: Multiple colonic polyps treated/removed as described above  . ESOPHAGOGASTRODUODENOSCOPY N/A 01/31/2015   RMR: mild changes of reflux esophagitis.  Tiny hiatal hernia. Status post passage of a Maloney dilator.   Marland Kitchen MALONEY DILATION N/A 01/31/2015   Procedure: Venia Minks DILATION;  Surgeon: Daneil Dolin, MD;  Location: [facility]ITE;  Service: Endoscopy;  Laterality: N/A;    Allergies  Allergen Reactions  . Levaquin [Levofloxacin]     nausea  . Lisinopril Cough    Immunization History  Administered Date(s) Administered  . Influenza,inj,Quad PF,36+ Mos 12/18/2014  . Tdap 02/23/2014  . Zoster 02/23/2014    Family History  Problem Relation Age of Onset  . Emphysema Father   . Emphysema Mother   . Lung cancer Maternal Grandfather   . Heart attack Maternal Grandmother      Current Outpatient Prescriptions:  .  albuterol (PROVENTIL HFA;VENTOLIN HFA) 108 (90 BASE) MCG/ACT inhaler, Inhale 2 puffs into the lungs every 6 (six) hours as needed for wheezing or shortness of breath., Disp: , Rfl:  .  albuterol (PROVENTIL) (2.5 MG/3ML) 0.083% nebulizer solution, Take 2.5 mg by nebulization every 6 (six) hours as needed for wheezing or shortness of breath., Disp: , Rfl:  .  amLODipine (NORVASC) 10 MG tablet, Take 10 mg by mouth daily., Disp: , Rfl:  .  Azelastine-Fluticasone (DYMISTA) 137-50 MCG/ACT SUSP, Place 1 spray into the nose 2 (two) times daily., Disp: 6 g, Rfl: 0 .  cetirizine (ZYRTEC) 10 MG tablet, Take 10 mg by mouth daily., Disp: , Rfl:  .  losartan-hydrochlorothiazide (HYZAAR) 100-12.5 MG tablet, Take 1 tablet by mouth daily., Disp: , Rfl:  .  mometasone-formoterol (DULERA) 200-5 MCG/ACT AERO, Inhale 2 puffs into the lungs 2 (two) times daily at 10 AM and 5 PM., Disp: 8.8 g, Rfl: 1 .  montelukast (SINGULAIR) 10 MG tablet, Take 10 mg by mouth daily., Disp: , Rfl:  .  Multiple Vitamins-Minerals (MULTIVITAMIN WITH MINERALS) tablet, Take 1 tablet by mouth daily., Disp: , Rfl:  .  norethindrone (AYGESTIN) 5 MG tablet, Take 2.5 mg by mouth daily., Disp: , Rfl:  .  Omega-3 Fatty Acids (FISH OIL) 1000 MG CAPS, Take 2 capsules by mouth  daily., Disp: , Rfl:  .  ranitidine (ZANTAC) 150 MG tablet, Take 150 mg by mouth 2 (two) times daily., Disp: , Rfl:  .  Red Yeast Rice 600 MG CAPS, Take 2 capsules by mouth daily., Disp: , Rfl:  .  sertraline (ZOLOFT) 100 MG tablet, Take 100 mg by mouth daily., Disp: , Rfl:  .  Tiotropium Bromide Monohydrate (SPIRIVA RESPIMAT) 2.5 MCG/ACT AERS, Inhale 2 puffs into the lungs daily., Disp: , Rfl:    Review of Systems     Objective:   Physical Exam  Vitals:   12/19/15 1150  BP: (!) 154/72  Pulse: 64  SpO2: 98%  Weight: 193 lb 6.4 oz (87.7 kg)  Height: 5' 2.5" (1.588 m)   Estimated body  mass index is 34.81 kg/m as calculated from the following:   Height as of this encounter: 5' 2.5" (1.588 m).   Weight as of this encounter: 193 lb 6.4 oz (87.7 kg).      Assessment:       ICD-9-CM ICD-10-CM   1. COPD, moderate ([place]) 496 J44.9   2. Lung nodule 793.11 R91.1   3. Dyspnea and respiratory abnormality 786.09 R06.00     R06.89        Plan:      #Shortness of breath  - Glad you're better with rehabilitation but too bad that is still some shortness of breath - this residual shortness of breath is due to weight and heart muscle not relaxing well - take efforts to lose weight  #Lung nodules -CT chest October 2016 and 2017 without chanbge  - do low dose CT for followup in oct 2018  *COPD moderate - Currently stable disease continue inhalers of Spiriva and Dulera for now - flu shot 12/19/2015   #Follow-up - after CT chest in October 2018 - 1 year or sooner if needed    (> 50% of this 15 min visit spent in face to face counseling or/and coordination of care)   Dr. Brand Males, M.D., [facility].C.P Pulmonary and Critical Care Medicine Staff Physician [place] [facility] Pager: [phone], If no answer or between  15:00h - 7:00h: call [phone]  12/19/2015 12:18 PM

## 2015-12-19 NOTE — Addendum Note (Signed)
Addended by: Collier Salina on: 12/19/2015 12:24 PM   Modules accepted: Orders

## 2015-12-24 DIAGNOSIS — R0600 Dyspnea, unspecified: Secondary | ICD-10-CM | POA: Diagnosis not present

## 2016-01-28 ENCOUNTER — Encounter: Payer: Self-pay | Admitting: Allergy & Immunology

## 2016-01-28 ENCOUNTER — Encounter (INDEPENDENT_AMBULATORY_CARE_PROVIDER_SITE_OTHER): Payer: Self-pay

## 2016-01-28 ENCOUNTER — Ambulatory Visit (INDEPENDENT_AMBULATORY_CARE_PROVIDER_SITE_OTHER): Payer: BLUE CROSS/BLUE SHIELD | Admitting: Allergy & Immunology

## 2016-01-28 VITALS — BP 132/68 | HR 68 | Temp 98.4°F | Ht 62.0 in | Wt 202.2 lb

## 2016-01-28 DIAGNOSIS — J449 Chronic obstructive pulmonary disease, unspecified: Secondary | ICD-10-CM

## 2016-01-28 DIAGNOSIS — J3089 Other allergic rhinitis: Secondary | ICD-10-CM | POA: Diagnosis not present

## 2016-01-28 MED ORDER — AMOXICILLIN-POT CLAVULANATE 875-125 MG PO TABS: 1.0000 | ORAL_TABLET | Freq: Two times a day (BID) | ORAL | 0 refills | Status: AC

## 2016-01-28 NOTE — Patient Instructions (Addendum)
1. Asthma-COPD overlap syndrome ([place]) - Lung function looked stable today. - Continue current inhaler medications, as recommended by Dr. Chase Caller.  2. Perennial allergic rhinitis with overlying sinus infection - Continue with Dymista two sprays per nostril 1-2 times daily. - Continue with cetirizine 10mg  daily. - Start Augmentin 875mg  one tablet twice daily for 14 days. - Use nasal saline rinses.   3. Return in about 6 months (around 07/28/2016).  Please inform us of any Emergency Department visits, hospitalizations, or changes in symptoms. Call us before going to the ED for breathing or allergy symptoms since we might be able to fit you in for a sick visit. Feel free to contact us anytime with any questions, problems, or concerns.  It was a pleasure to meet you today! Have a wonderful holiday season!   Websites that have reliable patient information: 1. American Academy of Asthma, Allergy, and Immunology: www.aaaai.org 2. Food Allergy Research and Education (FARE): foodallergy.org 3. Mothers of Asthmatics: http://www.asthmacommunitynetwork.org 4. American College of Allergy, Asthma, and Immunology: www.acaai.org

## 2016-01-28 NOTE — Progress Notes (Signed)
FOLLOW UP  Date of Service/Encounter:  01/28/16   Assessment:   Asthma-COPD overlap syndrome (HCC)  Perennial allergic rhinitis   Asthma Reportables:  Severity: severe persistent  Risk: high due comorbidities Control: well controlled  Seasonal Influenza Vaccine: yes   The CDC recommends patients with persistent asthma between the ages of 19-64 years receive PPSV-23 vaccination, if this patient qualifies, has he/she received 1 dose of PPSV-23 yet? Yes    Plan/Recommendations:   1. Asthma-COPD overlap syndrome ([place]) - Lung function looked stable today. - Continue current inhaler medications, as recommended by Dr. Chase Caller. - Will defer pulmonary management to Dr. Chase Caller. - Chest CT every October to follow lung nodules.   2. Perennial allergic rhinitis with overlying acute sinus infection - Continue with Dymista two sprays per nostril 1-2 times daily. - Continue with cetirizine 10mg  daily. - Start Augmentin 875mg  one tablet twice daily for 14 days. - Use nasal saline rinses.   3. Return in about 6 months (around 07/28/2016).    Subjective:   Alicia Hall is a 60 y.o. female presenting today for follow up of  Chief Complaint  Patient presents with  . Follow-up  .  Alicia Hall has a history of the following: Patient Active Problem List   Diagnosis Date Noted  . COPD, moderate ([place]) 07/05/2015  . Hypersomnia with sleep apnea 06/19/2015  . OSA (obstructive sleep apnea) 06/19/2015  . Depression 06/19/2015  . Insomnia 06/19/2015  . Lung nodule 03/08/2015  . History of colonic polyps   . GERD (gastroesophageal reflux disease) 01/25/2015  . Dysphagia 01/25/2015  . Encounter for screening colonoscopy 01/25/2015  . COPD (chronic obstructive pulmonary disease) ([place]) 12/18/2014  . Moderate persistent asthma 11/23/2014  . Dyspnea and respiratory abnormality 11/23/2014  . Smoking history 11/23/2014  . Upper airway cough syndrome 11/23/2014  . Allergic  rhinitis 11/15/2014    History obtained from: chart review and patient.  Alicia Hall was referred by Gar Ponto, MD.     Alicia Hall is a 60 y.o. female presenting for a follow up visit. At the last visit, she was continued on Dulera and Spiriva. She had an appointment with Dr. Chase Caller (Pulmonologist). She was continued on Dymista, Singulair, and cetirizine. Her next appointment with Dr. Chase Caller is next year. She is currently undergoing treatment in pulmonary rehabilitation and remains stable on triple therapy for COPD.   Since the last visit, she reports that she has done well. She remains on Dulera two puffs in the morning and two puffs at night. She remains on Spiriva one inhalation once daily. She does not use her rescue inhaler much at all. Compared to previous visits, she reports that she is overall doing quite well. She estimates that she needs her albuterol around 2-4 times per week at the most.   Her nasla allergy symptoms have been well controlled. She is on Dymista two sprays per nostril 1-2 times as well as cetirizine. She also reports some sinus symptoms with congestion. Symptoms have been going on for around two weeks. She is using some nasal saline sprays.   Outside Pulmonary Testing: CPST 12/18/15 - Respiratory exchange ratio 1.0 and adequate. She did have obstructive spirometry at rest but no posttest bronchospasm infection bronchodilation. She had hypertensive response with evidence of distal diastolic dysfunction. Also normal. VO2 which improved when corrected for ideal body weight suggesting obesity as cause of dyspnea.  CT Chest Wo Contrast (12/18/2015):  1. Similar bilateral pulmonary nodules, which can be presumed  benign given stability over greater than 1 year.  2. Similar appearance of interstitial lung disease, likely the sequelae of remote infection, including atypical etiologies.  3. Coronary artery atherosclerosis. Aortic atherosclerosis.  4. Trace left pleural  fluid or thickening, new.   Otherwise, there have been no changes to her past medical history, surgical history, family history, or social history.    Review of Systems: a 14-point review of systems is pertinent for what is mentioned in HPI.  Otherwise, all other systems were negative. Constitutional: negative other than that listed in the HPI Eyes: negative other than that listed in the HPI Ears, nose, mouth, throat, and face: negative other than that listed in the HPI Respiratory: negative other than that listed in the HPI Cardiovascular: negative other than that listed in the HPI Gastrointestinal: negative other than that listed in the HPI Genitourinary: negative other than that listed in the HPI Integument: negative other than that listed in the HPI Hematologic: negative other than that listed in the HPI Musculoskeletal: negative other than that listed in the HPI Neurological: negative other than that listed in the HPI Allergy/Immunologic: negative other than that listed in the HPI    Objective:   Blood pressure 132/68, pulse 68, temperature 98.4 F (36.9 C), temperature source Oral, height 5\' 2"  (1.575 m), weight 202 lb 3.2 oz (91.7 kg), SpO2 97 %. Body mass index is 36.98 kg/m.   Physical Exam:  General: Alert, interactive, in no acute distress. Cooperative with the exam. Able to speak in full sentences without a problem.  HEENT: TMs pearly gray, turbinates edematous and pale without discharge, post-pharynx mildly erythematous. Neck: Supple without thyromegaly. Lungs: Decreased breath sounds bilaterally without wheezing, rhonchi or rales. No increased work of breathing. CV: Normal 99991111, 2/6 systolic ejection murmur. Capillary refill <2 seconds.  Abdomen: Nondistended, nontender. No guarding or rebound tenderness. Bowel sounds faint and present in all fields  Skin: Warm and dry, without lesions or rashes. Extremities:  No clubbing, cyanosis or edema. Neuro:   Grossly  intact.  Diagnostic studies:  Spirometry: results normal (FEV1: 1.30/64%, FVC: 1.76/72%, FEV1/FVC: 74%).    Spirometry consistent with normal pattern and stable compared to previous spirometry.   Allergy Studies: None     Salvatore Marvel, MD [facility] [place]

## 2016-02-14 DIAGNOSIS — G4733 Obstructive sleep apnea (adult) (pediatric): Secondary | ICD-10-CM | POA: Diagnosis not present

## 2016-02-21 DIAGNOSIS — H40013 Open angle with borderline findings, low risk, bilateral: Secondary | ICD-10-CM | POA: Diagnosis not present

## 2016-03-05 DIAGNOSIS — K210 Gastro-esophageal reflux disease with esophagitis: Secondary | ICD-10-CM | POA: Diagnosis not present

## 2016-03-05 DIAGNOSIS — I10 Essential (primary) hypertension: Secondary | ICD-10-CM | POA: Diagnosis not present

## 2016-03-05 DIAGNOSIS — J441 Chronic obstructive pulmonary disease with (acute) exacerbation: Secondary | ICD-10-CM | POA: Diagnosis not present

## 2016-03-05 DIAGNOSIS — E782 Mixed hyperlipidemia: Secondary | ICD-10-CM | POA: Diagnosis not present

## 2016-03-10 DIAGNOSIS — G4733 Obstructive sleep apnea (adult) (pediatric): Secondary | ICD-10-CM | POA: Diagnosis not present

## 2016-03-10 DIAGNOSIS — E782 Mixed hyperlipidemia: Secondary | ICD-10-CM | POA: Diagnosis not present

## 2016-03-10 DIAGNOSIS — F331 Major depressive disorder, recurrent, moderate: Secondary | ICD-10-CM | POA: Diagnosis not present

## 2016-03-10 DIAGNOSIS — E6609 Other obesity due to excess calories: Secondary | ICD-10-CM | POA: Diagnosis not present

## 2016-04-02 ENCOUNTER — Encounter: Payer: Self-pay | Admitting: Internal Medicine

## 2016-04-28 DIAGNOSIS — J019 Acute sinusitis, unspecified: Secondary | ICD-10-CM | POA: Diagnosis not present

## 2016-05-07 DIAGNOSIS — H40013 Open angle with borderline findings, low risk, bilateral: Secondary | ICD-10-CM | POA: Diagnosis not present

## 2016-05-12 DIAGNOSIS — G4733 Obstructive sleep apnea (adult) (pediatric): Secondary | ICD-10-CM | POA: Diagnosis not present

## 2016-05-14 DIAGNOSIS — J209 Acute bronchitis, unspecified: Secondary | ICD-10-CM | POA: Diagnosis not present

## 2016-05-14 DIAGNOSIS — Z6837 Body mass index (BMI) 37.0-37.9, adult: Secondary | ICD-10-CM | POA: Diagnosis not present

## 2016-05-14 DIAGNOSIS — J0101 Acute recurrent maxillary sinusitis: Secondary | ICD-10-CM | POA: Diagnosis not present

## 2016-05-25 DIAGNOSIS — R05 Cough: Secondary | ICD-10-CM | POA: Diagnosis not present

## 2016-05-25 DIAGNOSIS — J0191 Acute recurrent sinusitis, unspecified: Secondary | ICD-10-CM | POA: Diagnosis not present

## 2016-05-25 DIAGNOSIS — Z6837 Body mass index (BMI) 37.0-37.9, adult: Secondary | ICD-10-CM | POA: Diagnosis not present

## 2016-05-25 DIAGNOSIS — M791 Myalgia: Secondary | ICD-10-CM | POA: Diagnosis not present

## 2016-07-06 DIAGNOSIS — E782 Mixed hyperlipidemia: Secondary | ICD-10-CM | POA: Diagnosis not present

## 2016-07-06 DIAGNOSIS — G4733 Obstructive sleep apnea (adult) (pediatric): Secondary | ICD-10-CM | POA: Diagnosis not present

## 2016-07-06 DIAGNOSIS — J441 Chronic obstructive pulmonary disease with (acute) exacerbation: Secondary | ICD-10-CM | POA: Diagnosis not present

## 2016-07-06 DIAGNOSIS — I10 Essential (primary) hypertension: Secondary | ICD-10-CM | POA: Diagnosis not present

## 2016-07-10 DIAGNOSIS — E782 Mixed hyperlipidemia: Secondary | ICD-10-CM | POA: Diagnosis not present

## 2016-07-10 DIAGNOSIS — F331 Major depressive disorder, recurrent, moderate: Secondary | ICD-10-CM | POA: Diagnosis not present

## 2016-07-10 DIAGNOSIS — G4733 Obstructive sleep apnea (adult) (pediatric): Secondary | ICD-10-CM | POA: Diagnosis not present

## 2016-07-10 DIAGNOSIS — E6609 Other obesity due to excess calories: Secondary | ICD-10-CM | POA: Diagnosis not present

## 2016-07-14 DIAGNOSIS — Z1231 Encounter for screening mammogram for malignant neoplasm of breast: Secondary | ICD-10-CM | POA: Diagnosis not present

## 2016-07-18 DIAGNOSIS — Z6838 Body mass index (BMI) 38.0-38.9, adult: Secondary | ICD-10-CM | POA: Diagnosis not present

## 2016-07-18 DIAGNOSIS — J209 Acute bronchitis, unspecified: Secondary | ICD-10-CM | POA: Diagnosis not present

## 2016-07-18 DIAGNOSIS — J441 Chronic obstructive pulmonary disease with (acute) exacerbation: Secondary | ICD-10-CM | POA: Diagnosis not present

## 2016-07-28 ENCOUNTER — Ambulatory Visit (INDEPENDENT_AMBULATORY_CARE_PROVIDER_SITE_OTHER): Payer: BLUE CROSS/BLUE SHIELD | Admitting: Allergy & Immunology

## 2016-07-28 ENCOUNTER — Encounter: Payer: Self-pay | Admitting: Allergy & Immunology

## 2016-07-28 ENCOUNTER — Encounter (INDEPENDENT_AMBULATORY_CARE_PROVIDER_SITE_OTHER): Payer: Self-pay

## 2016-07-28 VITALS — BP 140/70 | HR 66 | Temp 98.3°F | Resp 16

## 2016-07-28 DIAGNOSIS — J449 Chronic obstructive pulmonary disease, unspecified: Secondary | ICD-10-CM | POA: Diagnosis not present

## 2016-07-28 DIAGNOSIS — J3089 Other allergic rhinitis: Secondary | ICD-10-CM | POA: Diagnosis not present

## 2016-07-28 DIAGNOSIS — R918 Other nonspecific abnormal finding of lung field: Secondary | ICD-10-CM | POA: Diagnosis not present

## 2016-07-28 MED ORDER — BUDESONIDE-FORMOTEROL FUMARATE 160-4.5 MCG/ACT IN AERO: 2.0000 | INHALATION_SPRAY | Freq: Two times a day (BID) | RESPIRATORY_TRACT | 5 refills | Status: DC

## 2016-07-28 NOTE — Patient Instructions (Addendum)
1. Asthma-COPD overlap syndrome - Lung function showed some evidence of obstructive disease. - We will change you to Symbicort instead of Dulera because there is a cheaper copay.  - Daily controller medication(s): Symbicort 160/4.5 two puffs in the morning and two puffs at night + Spiriva one inhalation once daily - Rescue medications: albuterol 4 puffs every 4-6 hours as needed or albuterol nebulizer one vial puffs every 4-6 hours as needed - Asthma control goals:  * Full participation in all desired activities (may need albuterol before activity) * Albuterol use two time or less a week on average (not counting use with activity) * Cough interfering with sleep two time or less a month * Oral steroids no more than once a year * No hospitalizations - Continue with annual chest CTs to monitor pulmonary nodules with Dr. Chase Caller.  - We will get your blood work from Dr. Olena Heckle   2. Perennial allergic rhinitis - Continue with Dymista two sprays per nostril 1-2 times daily. - Continue with cetirizine 10mg  daily. - Use nasal saline rinses.   3. Return in about 6 months (around 01/27/2017).  Please inform us of any Emergency Department visits, hospitalizations, or changes in symptoms. Call us before going to the ED for breathing or allergy symptoms since we might be able to fit you in for a sick visit. Feel free to contact us anytime with any questions, problems, or concerns.  It was a pleasure to see you again today! Happy summer!   Websites that have reliable patient information: 1. American Academy of Asthma, Allergy, and Immunology: www.aaaai.org 2. Food Allergy Research and Education (FARE): foodallergy.org 3. Mothers of Asthmatics: http://www.asthmacommunitynetwork.org 4. American College of Allergy, Asthma, and Immunology: www.acaai.org

## 2016-07-28 NOTE — Progress Notes (Signed)
FOLLOW UP  Date of Service/Encounter:  07/28/16   Assessment:   Asthma-COPD overlap syndrome (HCC)  Perennial allergic rhinitis  Pulmonary nodules (stable) - chest CTs every October   Plan/Recommendations:   1. Asthma-COPD overlap syndrome - Lung function showed some evidence of obstructive disease. - We will change you to Symbicort instead of Dulera because there is a cheaper copay.  - Daily controller medication(s): Symbicort 160/4.5 two puffs in the morning and two puffs at night + Spiriva one inhalation once daily - Rescue medications: albuterol 4 puffs every 4-6 hours as needed or albuterol nebulizer one vial puffs every 4-6 hours as needed - Asthma control goals:  * Full participation in all desired activities (may need albuterol before activity) * Albuterol use two time or less a week on average (not counting use with activity) * Cough interfering with sleep two time or less a month * Oral steroids no more than once a year * No hospitalizations - Continue with annual chest CTs to monitor pulmonary nodules with Dr. Chase Hall.  - We will get your blood work from Dr. Olena Hall   2. Perennial allergic rhinitis - Continue with Dymista two sprays per nostril 1-2 times daily. - Continue with cetirizine 10mg  daily. - Use nasal saline rinses.   3. Return in about 6 months (around 01/27/2017).   Subjective:   Alicia Hall is a 61 y.o. female presenting today for follow up of  Chief Complaint  Patient presents with  . Asthma    Alicia Hall has a history of the following: Patient Active Problem List   Diagnosis Date Noted  . Asthma-COPD overlap syndrome ([place]) 07/28/2016  . COPD, moderate ([place]) 07/05/2015  . Hypersomnia with sleep apnea 06/19/2015  . OSA (obstructive sleep apnea) 06/19/2015  . Depression 06/19/2015  . Insomnia 06/19/2015  . Lung nodule 03/08/2015  . History of colonic polyps   . GERD (gastroesophageal reflux disease) 01/25/2015  . Dysphagia  01/25/2015  . Encounter for screening colonoscopy 01/25/2015  . COPD (chronic obstructive pulmonary disease) ([place]) 12/18/2014  . Moderate persistent asthma 11/23/2014  . Dyspnea and respiratory abnormality 11/23/2014  . Smoking history 11/23/2014  . Upper airway cough syndrome 11/23/2014  . Perennial allergic rhinitis 11/15/2014    History obtained from: chart review and patient.  Alicia Hall was referred by Alicia Bis, MD.     Alicia Hall is a 61 y.o. female presenting for a follow up visit. She was last seen in December 2017. She has a history of asthma COPD overlap syndrome. She is followed closely by Dr. Chase Hall. She was on Dulera 200/5 g 2 puffs twice daily as well as Spiriva 1 inhalation daily. We deferred management of her asthma COPD overlap to her pulmonologist. For her allergic rhinitis, she was continued on Dymista 2 sprays per nostril 1-2 times daily, cetirizine 10 mg daily, nasal saline rinses. At the last visit, we started her on 2 weeks of Augmentin for sinusitis.  Since the last visit, she has mostly done well. Two weeks ago, she was given an IM injection of steroid as well as azithromycin course. She also received a neb treatment. She felt that she recovered and she does not feel bad today. However she does have obstructive disease on her spirometry today. She has remained on her Dulera two puffs twice daily. She has continued to obtain samples from [place] as a means of maintaining her on this controller medication. She also remains on her Spiriva, which is much  more manageable financially. Currently, she is paying $20 a month for her Alicia Hall which is improved from where it was before. She sees her pulmonologist once annually. She denies worsening coughing, hemoptysis, or any other concern. She continues to smoke.  She has been on Symbicort in the past and she thought that Alicia Hall was working. However it has been expensive for her in the past. Currently she is getting by through  samples provided by [place]. She feels that her asthma is not under good control. She has been to see her PCP's office on multiple occasions, and according to the patient her asthma is the only reason that she ever goes to see her primary care doctor. She is followed by Dr. Gar Hall. Her history is rather vague and nonspecific with regards to her prednisone courses in number of antibiotic courses.  For her allergic rhinitis, she remains on Dymista one spray per nostril in the morning. Occasionally, she will take an extra spray at night. She is not on a regular antihistamine. With the nasal spray, she feels that her symptoms are under good control.  Otherwise, there have been no changes to her past medical history, surgical history, family history, or social history.    Review of Systems: a 14-point review of systems is pertinent for what is mentioned in HPI.  Otherwise, all other systems were negative. Constitutional: negative other than that listed in the HPI Eyes: negative other than that listed in the HPI Ears, nose, mouth, throat, and face: negative other than that listed in the HPI Respiratory: negative other than that listed in the HPI Cardiovascular: negative other than that listed in the HPI Gastrointestinal: negative other than that listed in the HPI Genitourinary: negative other than that listed in the HPI Integument: negative other than that listed in the HPI Hematologic: negative other than that listed in the HPI Musculoskeletal: negative other than that listed in the HPI Neurological: negative other than that listed in the HPI Allergy/Immunologic: negative other than that listed in the HPI    Objective:   Blood pressure 140/70, pulse 66, temperature 98.3 F (36.8 C), temperature source Oral, resp. rate 16, SpO2 97 %. There is no height or weight on file to calculate BMI.   Physical Exam:  General: Alert, interactive, in no acute distress. Pleasant obese female.  Eyes: No  conjunctival injection present on the right, No conjunctival injection present on the left, PERRL bilaterally, No discharge on the right, No discharge on the left and No Horner-Trantas dots present Ears: Right TM pearly gray with normal light reflex, Left TM pearly gray with normal light reflex, Right TM intact without perforation and Left TM intact without perforation.  Nose/Throat: External nose within normal limits and septum midline, turbinates edematous and pale with clear discharge, post-pharynx erythematous with cobblestoning in the posterior oropharynx. Tonsils 2+ without exudates Neck: Supple without thyromegaly. Lungs: Decreased breath sounds with expiratory wheezing bilaterally. No increased work of breathing. CV: Normal S1/S2, no murmurs. Capillary refill <2 seconds.  Skin: Warm and dry, without lesions or rashes. Neuro:   Grossly intact. No focal deficits appreciated. Responsive to questions.   Diagnostic studies:   Spirometry: results abnormal (FEV1: 1.33/65%, FVC: 2.17/89%, FEV1/FVC: 61%).    Spirometry consistent with mild obstructive disease. Albuterol/Atrovent nebulizer treatment given in clinic with significant improvement. Her FEV1 increased 14%.  Allergy Studies: none   Salvatore Marvel, MD [facility] [place]

## 2016-07-29 DIAGNOSIS — N6489 Other specified disorders of breast: Secondary | ICD-10-CM | POA: Diagnosis not present

## 2016-07-29 DIAGNOSIS — R928 Other abnormal and inconclusive findings on diagnostic imaging of breast: Secondary | ICD-10-CM | POA: Diagnosis not present

## 2016-09-14 DIAGNOSIS — J301 Allergic rhinitis due to pollen: Secondary | ICD-10-CM | POA: Diagnosis not present

## 2016-09-14 DIAGNOSIS — Z6839 Body mass index (BMI) 39.0-39.9, adult: Secondary | ICD-10-CM | POA: Diagnosis not present

## 2016-09-14 DIAGNOSIS — J4541 Moderate persistent asthma with (acute) exacerbation: Secondary | ICD-10-CM | POA: Diagnosis not present

## 2016-09-14 DIAGNOSIS — J439 Emphysema, unspecified: Secondary | ICD-10-CM | POA: Diagnosis not present

## 2016-09-21 ENCOUNTER — Telehealth: Payer: Self-pay | Admitting: Allergy & Immunology

## 2016-09-21 NOTE — Telephone Encounter (Signed)
Patient wants to come see [name] tomorrow. She said she is coughing.

## 2016-09-22 ENCOUNTER — Encounter (INDEPENDENT_AMBULATORY_CARE_PROVIDER_SITE_OTHER): Payer: Self-pay

## 2016-09-22 ENCOUNTER — Ambulatory Visit (INDEPENDENT_AMBULATORY_CARE_PROVIDER_SITE_OTHER): Payer: BLUE CROSS/BLUE SHIELD | Admitting: Allergy & Immunology

## 2016-09-22 ENCOUNTER — Telehealth: Payer: Self-pay

## 2016-09-22 ENCOUNTER — Encounter: Payer: Self-pay | Admitting: Allergy & Immunology

## 2016-09-22 VITALS — BP 120/72 | HR 85 | Temp 98.0°F | Resp 19

## 2016-09-22 DIAGNOSIS — J0100 Acute maxillary sinusitis, unspecified: Secondary | ICD-10-CM

## 2016-09-22 DIAGNOSIS — J4540 Moderate persistent asthma, uncomplicated: Secondary | ICD-10-CM | POA: Diagnosis not present

## 2016-09-22 DIAGNOSIS — J3089 Other allergic rhinitis: Secondary | ICD-10-CM

## 2016-09-22 DIAGNOSIS — R918 Other nonspecific abnormal finding of lung field: Secondary | ICD-10-CM | POA: Diagnosis not present

## 2016-09-22 MED ORDER — ALBUTEROL SULFATE (2.5 MG/3ML) 0.083% IN NEBU: 2.5000 mg | INHALATION_SOLUTION | Freq: Four times a day (QID) | RESPIRATORY_TRACT | 1 refills | Status: DC | PRN

## 2016-09-22 MED ORDER — METHYLPREDNISOLONE ACETATE 40 MG/ML IJ SUSP
40.0000 mg | Freq: Once | INTRAMUSCULAR | Status: AC
  Administered 2016-09-22: 40 mg via INTRAMUSCULAR

## 2016-09-22 MED ORDER — AMOXICILLIN-POT CLAVULANATE 875-125 MG PO TABS: 1.0000 | ORAL_TABLET | Freq: Two times a day (BID) | ORAL | 0 refills | Status: DC

## 2016-09-22 NOTE — Progress Notes (Signed)
FOLLOW UP  Date of Service/Encounter:  09/22/16   Assessment:   Moderate persistent asthma without complication  Perennial allergic rhinitis  Pulmonary nodules  Acute sinusitis    Asthma Reportables:  Severity: moderate persistent  Risk: high Control: not well controlled   Plan/Recommendations:   1. Acute sinusitis - partially treated with one course of IM steroid - With your current symptoms and time course, antibiotics are needed.  - Start Augmentin 875mg  twice daily for two weeks.  - Continue with nasal saline spray (i.e., Simply Saline) or nasal saline lavage (i.e., NeilMed) as needed prior to medicated nasal sprays. - For thick post nasal drainage, add guaifenesin [phone] mg (Mucinex)  twice daily as needed with adequate hydration as discussed.  2. Asthma-COPD overlap syndrome - Lung function showed some evidence of obstructive disease. - We gave you one injection of a steroid today. - Daily controller medication(s): Symbicort 160/4.5 two puffs in the morning and two puffs at night + Spiriva two inhalations once daily - Rescue medications: albuterol 4 puffs every 4-6 hours as needed or albuterol nebulizer one vial puffs every 4-6 hours as needed - Asthma control goals:  * Full participation in all desired activities (may need albuterol before activity) * Albuterol use two time or less a week on average (not counting use with activity) * Cough interfering with sleep two time or less a month * Oral steroids no more than once a year * No hospitalizations - Continue with annual chest CTs to monitor pulmonary nodules with Dr. Chase Caller.  - We will get labs to see whether injectable medications such as Xolair, Fasenra, or Nucala are an option as a means of controlling her asthma.   3. Perennial allergic rhinitis - Continue with Dymista two sprays per nostril 1-2 times daily. - Continue with cetirizine 10mg  daily. - Use nasal saline rinses.   3. Return in about 3  months (around 12/23/2016).   Subjective:   Alicia Hall is a 61 y.o. female presenting today for follow up of  Chief Complaint  Patient presents with  . Asthma    Pt is C/O of cough, congestion, wheezing, and SOB x 3 weeks. Pt recieved a prednisone shot last week with no relief.     Alicia Hall has a history of the following: Patient Active Problem List   Diagnosis Date Noted  . Pulmonary nodules 09/22/2016  . Asthma-COPD overlap syndrome ([place]) 07/28/2016  . COPD, moderate ([place]) 07/05/2015  . Hypersomnia with sleep apnea 06/19/2015  . OSA (obstructive sleep apnea) 06/19/2015  . Depression 06/19/2015  . Insomnia 06/19/2015  . Lung nodule 03/08/2015  . History of colonic polyps   . GERD (gastroesophageal reflux disease) 01/25/2015  . Dysphagia 01/25/2015  . Encounter for screening colonoscopy 01/25/2015  . COPD (chronic obstructive pulmonary disease) ([place]) 12/18/2014  . Moderate persistent asthma 11/23/2014  . Dyspnea and respiratory abnormality 11/23/2014  . Smoking history 11/23/2014  . Upper airway cough syndrome 11/23/2014  . Perennial allergic rhinitis 11/15/2014    History obtained from: chart review and patient.  Alicia Alcon Cross was referred by Caryl Bis, MD.     Alicia Hall is a 61 y.o. female presenting for a sick visit.  She was last seen in June 2002. She has history of asthma and COPD overlap syndrome. At the last visit, we changed her from [facility] to Symbicort due to cheaper co-pay. She also has Spiriva which she uses 1 puff once daily. Her perennial allergic rhinitis was  controlled with Dymista 1-2 sprays per nostril daily as well as cetirizine 10mg  daily.   Since the last visit, she has mostly done well. However over the past three weeks, she has had coughing, congestion, and yellow nasal discharge. She did go to see her PCP where she was given an IM steroid with some improvement in her breathing. However, she continued to have discharge as well as  postnasal drip. She did have some sinus pressure as well. Asthma has not been under great control, and she has been using her albuterol on a regular basis. Her last antibiotic course was six weeks ago (azithromycin).   Prior to the onset of the current set of symptoms, she was doing well with her asthma. She was continuing with the [facility] since she had some of these at home. Then she will switch to the Symbicort. L[place]ell's asthma has been well controlled. She has not required rescue medication, experienced nocturnal awakenings due to lower respiratory symptoms, nor have activities of daily living been limited. She has required no Emergency Department or Urgent Care visits for her asthma. She has required one course of systemic steroids for asthma exacerbations since the last visit. ACT score today is 19, indicating excellent asthma symptom control.   Of note, she estimates that she needs prednisone (PO and injections) around twice monthly. Upon review of this with the patient, it seems that she goes to either her PCP, Urgent Care, or [place] for sick visits. Therefore it is difficult to get a good idea of how often she truly needs prednisone.   Otherwise, there have been no changes to her past medical history, surgical history, family history, or social history.    Review of Systems: a 14-point review of systems is pertinent for what is mentioned in HPI.  Otherwise, all other systems were negative. Constitutional: negative other than that listed in the HPI Eyes: negative other than that listed in the HPI Ears, nose, mouth, throat, and face: negative other than that listed in the HPI Respiratory: negative other than that listed in the HPI Cardiovascular: negative other than that listed in the HPI Gastrointestinal: negative other than that listed in the HPI Genitourinary: negative other than that listed in the HPI Integument: negative other than that listed in the HPI Hematologic: negative other than that  listed in the HPI Musculoskeletal: negative other than that listed in the HPI Neurological: negative other than that listed in the HPI Allergy/Immunologic: negative other than that listed in the HPI    Objective:   Blood pressure 120/72, pulse 85, temperature 98 F (36.7 C), temperature source Oral, resp. rate 19, SpO2 95 %. There is no height or weight on file to calculate BMI.   Physical Exam:  General: Alert, interactive, in no acute distress. Cooperative with the exam.  Eyes: No conjunctival injection present on the right, No conjunctival injection present on the left, PERRL bilaterally, No discharge on the right and No discharge on the left Ears: Right TM pearly gray with normal light reflex, Left TM pearly gray with normal light reflex, Right TM intact without perforation and Left TM intact without perforation.  Nose/Throat: External nose within normal limits and nasal crease present, turbinates edematous and pale with clear discharge, post-pharynx erythematous with cobblestoning in the posterior oropharynx. Tonsils 2+ without exudates Neck: Supple without thyromegaly. Lungs: Clear to auscultation without wheezing, rhonchi or rales. No increased work of breathing. CV: Normal S1/S2, no murmurs. Capillary refill <2 seconds.  Skin: Warm and dry, without  lesions or rashes. Neuro:   Grossly intact. No focal deficits appreciated. Responsive to questions.   Diagnostic studies:   Spirometry: results abnormal (FEV1: 1.32/64%, FVC: 2.14/87%, FEV1/FVC: 61%).    Spirometry consistent with mild obstructive disease. Albuterol/Atrovent nebulizer treatment given. We did not obtain a post-bronchodilator spirometry.   Allergy Studies: none     Salvatore Marvel, MD [facility] [place]

## 2016-09-22 NOTE — Telephone Encounter (Signed)
Pt started coughing on and off for a while, she had some shortness of breath and wheezing, started a few weeks ago. She has a lot of congestion. She did receive a steroid shot last week. Please advise

## 2016-09-22 NOTE — Telephone Encounter (Signed)
I saw the patient in clinic and we treated her for a sinus infection.  Salvatore Marvel, MD [facility] [place]

## 2016-09-22 NOTE — Telephone Encounter (Signed)
Pt does not want to be seen!

## 2016-09-22 NOTE — Patient Instructions (Addendum)
1. Acute sinusitis - With your current symptoms and time course, antibiotics are needed.  - Start Augmentin 875mg  twice daily for two weeks.  - Continue with nasal saline spray (i.e., Simply Saline) or nasal saline lavage (i.e., NeilMed) as needed prior to medicated nasal sprays. - For thick post nasal drainage, add guaifenesin [phone] mg (Mucinex)  twice daily as needed with adequate hydration as discussed.  2. Asthma-COPD overlap syndrome - Lung function showed some evidence of obstructive disease. - We gave you one injection of a steroid today. - Daily controller medication(s): Symbicort 160/4.5 two puffs in the morning and two puffs at night + Spiriva one inhalation once daily - Rescue medications: albuterol 4 puffs every 4-6 hours as needed or albuterol nebulizer one vial puffs every 4-6 hours as needed - Asthma control goals:  * Full participation in all desired activities (may need albuterol before activity) * Albuterol use two time or less a week on average (not counting use with activity) * Cough interfering with sleep two time or less a month * Oral steroids no more than once a year * No hospitalizations - Continue with annual chest CTs to monitor pulmonary nodules with Dr. Chase Caller.  - We will get your blood work from Dr. Olena Heckle   3. Perennial allergic rhinitis - Continue with Dymista two sprays per nostril 1-2 times daily. - Continue with cetirizine 10mg  daily. - Use nasal saline rinses.   3. Return in about 3 months (around 12/23/2016).  Please inform us of any Emergency Department visits, hospitalizations, or changes in symptoms. Call us before going to the ED for breathing or allergy symptoms since we might be able to fit you in for a sick visit. Feel free to contact us anytime with any questions, problems, or concerns.  It was a pleasure to see you again today! Happy summer!   Websites that have reliable patient information: 1. American Academy of Asthma, Allergy,  and Immunology: www.aaaai.org 2. Food Allergy Research and Education (FARE): foodallergy.org 3. Mothers of Asthmatics: http://www.asthmacommunitynetwork.org 4. American College of Allergy, Asthma, and Immunology: www.acaai.org

## 2016-09-24 ENCOUNTER — Ambulatory Visit: Payer: BLUE CROSS/BLUE SHIELD | Admitting: Allergy & Immunology

## 2016-10-05 ENCOUNTER — Ambulatory Visit: Payer: BLUE CROSS/BLUE SHIELD | Admitting: Nurse Practitioner

## 2016-10-31 DIAGNOSIS — J4541 Moderate persistent asthma with (acute) exacerbation: Secondary | ICD-10-CM | POA: Diagnosis not present

## 2016-10-31 DIAGNOSIS — Z6838 Body mass index (BMI) 38.0-38.9, adult: Secondary | ICD-10-CM | POA: Diagnosis not present

## 2016-10-31 DIAGNOSIS — Z23 Encounter for immunization: Secondary | ICD-10-CM | POA: Diagnosis not present

## 2016-11-09 DIAGNOSIS — J441 Chronic obstructive pulmonary disease with (acute) exacerbation: Secondary | ICD-10-CM | POA: Diagnosis not present

## 2016-11-09 DIAGNOSIS — E782 Mixed hyperlipidemia: Secondary | ICD-10-CM | POA: Diagnosis not present

## 2016-11-09 DIAGNOSIS — Z9189 Other specified personal risk factors, not elsewhere classified: Secondary | ICD-10-CM | POA: Diagnosis not present

## 2016-11-09 DIAGNOSIS — K210 Gastro-esophageal reflux disease with esophagitis: Secondary | ICD-10-CM | POA: Diagnosis not present

## 2016-11-09 DIAGNOSIS — I10 Essential (primary) hypertension: Secondary | ICD-10-CM | POA: Diagnosis not present

## 2016-11-12 DIAGNOSIS — G4733 Obstructive sleep apnea (adult) (pediatric): Secondary | ICD-10-CM | POA: Diagnosis not present

## 2016-11-12 DIAGNOSIS — F331 Major depressive disorder, recurrent, moderate: Secondary | ICD-10-CM | POA: Diagnosis not present

## 2016-11-12 DIAGNOSIS — E782 Mixed hyperlipidemia: Secondary | ICD-10-CM | POA: Diagnosis not present

## 2016-11-12 DIAGNOSIS — Z23 Encounter for immunization: Secondary | ICD-10-CM | POA: Diagnosis not present

## 2016-11-12 DIAGNOSIS — I10 Essential (primary) hypertension: Secondary | ICD-10-CM | POA: Diagnosis not present

## 2016-11-16 ENCOUNTER — Telehealth: Payer: Self-pay | Admitting: Allergy & Immunology

## 2016-11-16 NOTE — Telephone Encounter (Signed)
Called patient and informed her it was ok if she wanted to take a different antihistamine.

## 2016-11-16 NOTE — Telephone Encounter (Signed)
Patient called and said she is out of cetirizine and wants to know if she can take another prescription that she has at home, loratadine? She said you can call cell:[phone] or home:[phone].

## 2016-11-20 ENCOUNTER — Other Ambulatory Visit: Payer: Self-pay | Admitting: Internal Medicine

## 2016-11-20 DIAGNOSIS — R911 Solitary pulmonary nodule: Secondary | ICD-10-CM

## 2016-12-11 DIAGNOSIS — G4733 Obstructive sleep apnea (adult) (pediatric): Secondary | ICD-10-CM | POA: Diagnosis not present

## 2016-12-18 ENCOUNTER — Ambulatory Visit (HOSPITAL_COMMUNITY)
Admission: RE | Admit: 2016-12-18 | Discharge: 2016-12-18 | Disposition: A | Discharge status: Home / Self Care | Payer: BLUE CROSS/BLUE SHIELD | Source: Ambulatory Visit | Attending: Internal Medicine | Admitting: Internal Medicine

## 2016-12-18 DIAGNOSIS — I700 Atherosclerosis of aorta: Secondary | ICD-10-CM | POA: Diagnosis not present

## 2016-12-18 DIAGNOSIS — R918 Other nonspecific abnormal finding of lung field: Secondary | ICD-10-CM | POA: Insufficient documentation

## 2016-12-18 DIAGNOSIS — J849 Interstitial pulmonary disease, unspecified: Secondary | ICD-10-CM | POA: Insufficient documentation

## 2016-12-18 DIAGNOSIS — R911 Solitary pulmonary nodule: Secondary | ICD-10-CM

## 2016-12-21 ENCOUNTER — Ambulatory Visit (INDEPENDENT_AMBULATORY_CARE_PROVIDER_SITE_OTHER): Payer: BLUE CROSS/BLUE SHIELD | Admitting: Internal Medicine

## 2016-12-21 ENCOUNTER — Encounter: Payer: Self-pay | Admitting: Internal Medicine

## 2016-12-21 VITALS — BP 122/76 | HR 77 | Ht 62.0 in | Wt 216.4 lb

## 2016-12-21 DIAGNOSIS — R911 Solitary pulmonary nodule: Secondary | ICD-10-CM

## 2016-12-21 DIAGNOSIS — J449 Chronic obstructive pulmonary disease, unspecified: Secondary | ICD-10-CM | POA: Diagnosis not present

## 2016-12-21 DIAGNOSIS — J441 Chronic obstructive pulmonary disease with (acute) exacerbation: Secondary | ICD-10-CM | POA: Diagnosis not present

## 2016-12-21 MED ORDER — DOXYCYCLINE HYCLATE 100 MG PO TABS: 100.0000 mg | ORAL_TABLET | Freq: Two times a day (BID) | ORAL | 0 refills | Status: DC

## 2016-12-21 MED ORDER — TIOTROPIUM BROMIDE MONOHYDRATE 2.5 MCG/ACT IN AERS: 2.0000 | INHALATION_SPRAY | Freq: Every day | RESPIRATORY_TRACT | 0 refills | Status: DC

## 2016-12-21 MED ORDER — AZELASTINE-FLUTICASONE 137-50 MCG/ACT NA SUSP: 1.0000 | Freq: Two times a day (BID) | NASAL | 0 refills | Status: DC

## 2016-12-21 MED ORDER — PREDNISONE 10 MG PO TABS: ORAL_TABLET | ORAL | 0 refills | Status: DC

## 2016-12-21 NOTE — Progress Notes (Signed)
Subjective:     Patient ID: Alicia Hall, female   DOB: 18-Nov-1955, 61 y.o.   MRN: [id number]  HPI   PCP .Gar Ponto, MD  HPI  IOV 11/23/2014  Chief Complaint  Patient presents with  . pulmonary consult    pt. states she has SOB. chest sore at times. pt. denies chest tightness, prod. cough clear in color.   61 year old follow-up heavy smoker referred by Dr. Ishmael Holter. History is provided by the patient and review of the outside chart. As best as I can gather patient has a long-standing history of chronic cough, wheezing, shortness of breath. Shortness of breath is the dominant symptom. This is been going on for several years. According to the patient's brother 2 years ago she was extremely sick with the symptoms that family thought she might die. Year 2015 was about a year. But again in the year 2016 symptoms have persisted and are getting worse slowly. The particular he was in the last several months. A year ago she started seeing Dr. Ishmael Holter. According to the patient's history Symbicort was changed to [facility] a year ago but did not help. Then somewhere along the way Spiriva was added. But she still has symptoms. She describes symptoms getting worse with heat and humidity and with exertion but also relieved by rest and by albuterol nebulizers. There is one outside note 09/13/2014 from Dr. Ishmael Holter that suggest that the patient history is likely accurate.  Currently for the last few months have asthma control questionnaire shows significant amount of symptoms. At night she wakes up a few times. On waking up in the morning she is quite severe symptoms which she thinks is because of asthma. She is also very limited in her activities because of asthma. She describes a great deal of shortness of breath because of asthma. And is wheezing most of the time because of asthma and is using albuterol nebulizer for rescue at least 4 times a day. This brings to 5. question an average score to 4 that shows high  activity of symptoms or poor control if she indeed has asthma  61 yo former smoker seen for pulmonary consult on 11/23/14 with Dr. Chase Caller for dyspnea.   12/18/2014 Follow up : COPD /Asthma  Pt presents for a follow up for COPD ./Asthma   Pt was seen for pulmonary consult 1 month ago .  CT chest on on October 7 showed mild emphysema with diffuse bronchial wall thickening, mild bronchiectasis with some mild tree-in-bud opacities. There were some scattered tiny pulmonary nodules, the largest at 4 mm. There was also some noted. Atherosclerosis, PFT on 11/30/14 showed post BD FEV1 53%, ratio 67, +BD ressponse, FVC 61%, DLCO 74.  I reviewed these results with the patient and her husband. She is currently on [place]. Currently she feels her breathing is at baseline. She has an ongoing dry cough. She does get short of breath with activity. Shortness of breath seems to be worse first thing in the morning when she gets up. Patient does have a family history of COPD. Patient's BMI is 37.  Exhaled nitric oxide test. Last visit was only 17. She is maintained on Prilosec and Zantac for possible reflux She is on Zyrtec and dymista  nasal spray for postnasal drip. Says that she was on lisinopril about a year ago and this was change, which did help her cough some. He was also changed off of atenolol but helps slightly. She is now on Hyzaar. Patient denies  any hemoptysis, chest pain, orthopnea, PND, or increased leg swelling. She has no known heart disease.   OV 03/08/2015  Chief Complaint  Patient presents with  . Follow-up    Pt states her breathing is unchanged since last OV. Pt c/o cough with little mucus production - clear in color. Pt denies CP/tightness.     61 year old obese female with dyspnea out of proportion to obstructive lung disease treatment. She is here for follow-up after see my nurse practitioner in October 2016. This is a follow-up to see effects of pulmonary rehabilitation  on dyspnea. However there was a delayed started pulmonary rehabilitation due to insurance which. She has only completed 2 sessions with pulmonary rehabilitation. She still has 2-3 months ago. She underwent pulmonary rehabilitation at [facility]. Currently she feels stable. There are no new issues. She is complaint with her medications below.  OV 07/05/2015  Chief Complaint  Patient presents with  . Follow-up    breathing is doing better.  no concerns.   Follow-up  - Multiple lung nodules with largest 4 mm: She has 1 yera CT scan of the chest, not October 2017  - Moderate COPD: She is on triple inhaler therapy. She still has some cough. It is noted that she is on fish oil. Overall stable disease   - Out of proportion dyspnea: She has been attending pulmonary rehabilitation at [facility] since January 2017 and this has helped but only somewhat. She still has residual dyspnea that she feels is somewhat significant. -     OV 12/19/2015  Chief Complaint  Patient presents with  . Follow-up    Pt here after CT and CPST. pt states her SOB is unchanged since last OV in 06/2015. Pt c/o prod cough with clear mucus. Pt denies CP/tightness and f/c/s.    No new complaints  Here to reviewe results  CPST 12/18/15 - Respiratory exchange ratio 1.0 and adequate. She did have obstructive spirometry at rest but no posttest bronchospasm infection bronchodilation. She had hypertensive response with evidence of distal diastolic dysfunction. Also normal. VO2 which improved when corrected for ideal body weight suggesting obesity as cause of dyspnea.   Ct Chest Wo Contrast  Result Date: 12/18/2015 CLINICAL DATA:  Congestion for 2 days. Followup of pulmonary nodule. EXAM: CT CHEST WITHOUT CONTRAST TECHNIQUE: Multidetector CT imaging of the chest was performed following the standard protocol without IV contrast. COMPARISON:  08/28/2008 FINDINGS: Cardiovascular: Aortic and branch vessel atherosclerosis. Normal  heart size, without pericardial effusion. Multivessel coronary artery atherosclerosis. Mediastinum/Nodes: No mediastinal or definite hilar adenopathy, given limitations of unenhanced CT. Lungs/Pleura: Trace left pleural fluid or thickening, new. Lower lobe predominant bronchial wall thickening. Mild centrilobular emphysema. 4 mm right upper chest a 4 mm right lower lobe pulmonary nodule on image 86/series 3 is unchanged. left upper lobe/lingular 4 mm nodule is not significantly changed on image 75/series 3. Left lower lobe 4 mm nodule on image 98/series 3 is unchanged. Immediately cephalad 4 mm nodule on image 93/series 3 is also similar. Anterior upper lobe bronchiectasis and architectural distortion is greater on the right than left and similar. Upper Abdomen: Normal imaged portions of the liver, spleen, stomach, adrenal glands. Musculoskeletal: Thoracic spondylosis. IMPRESSION: 1. Similar bilateral pulmonary nodules, which can be presumed benign given stability over greater than 1 year. 2. Similar appearance of interstitial lung disease, likely the sequelae of remote infection, including atypical etiologies. 3.  Coronary artery atherosclerosis. Aortic atherosclerosis. 4. Trace left pleural fluid or thickening, new. Electronically  Signed   By: Abigail Miyamoto M.D.   On: 12/18/2015 13:02   OV 12/21/2016  Chief Complaint  Patient presents with  . Follow-up    CT 12/18/16.  Pt states that she has been doing good. C/o congestion with wheezing, thinks she might be getting a sinus infection.     Follow-up  # multiple lung nodules this is documented below. She had one year follow-up CT scan of the chest and this is stable. I personally visualized the CT scan  #Moderate COPD: She is on triple inhaler therapy along with Singulair. Some associated mild bronchiectasis is documented below. Overall stable but for the past one week she is reporting increased sinus congestion cough and wheezing. She thinks she is  steroid shot. She is up-to-date with her flu shot. She is compliant with her inhalers.    CLINICAL DATA:  Patient with history of pulmonary nodule. Follow-up exam.  EXAM: CT CHEST WITHOUT CONTRAST  TECHNIQUE: Multidetector CT imaging of the chest was performed following the standard protocol without IV contrast.  COMPARISON:  CT chest 12/18/2015.  FINDINGS: Cardiovascular: Normal heart size. Coronary arterial vascular calcifications. Thoracic aortic vascular calcifications.  Mediastinum/Nodes: No enlarged axillary, mediastinal or hilar lymphadenopathy. The esophagus is normal in appearance.  Lungs/Pleura: Patent central airways. Centrilobular emphysematous change. Re- demonstrated anterior upper lobe bronchiectasis in architectural distortion, right-greater-than-left. Unchanged 4 mm right lower lobe nodule (image 82; series 4). Unchanged 4 mm nodule within the lingula (image 77; series 4). Unchanged 4 mm left lower lobe nodule (image 95; series 4). Additional 4 mm left lower lobe nodule (image 89; series 4) is stable. No pleural effusion or pneumothorax.  Upper Abdomen: Unremarkable.  Musculoskeletal: Thoracic spine degenerative changes. No aggressive or acute appearing osseous lesions.  IMPRESSION: 1. Stable bilateral pulmonary nodules, most compatible with benign etiology given stability over time. 2. Similar appearance interstitial lung disease, likely sequelae of remote infection. 3. Aortic atherosclerosis.   Electronically Signed   By: Lovey Newcomer M.D.   On: 12/18/2016 11:59   has a past medical history of Asthma; COPD (chronic obstructive pulmonary disease) ([place]); GERD (gastroesophageal reflux disease); Hypertension; OSA (obstructive sleep apnea); and Seasonal allergies.   reports that she quit smoking about 21 years ago. Her smoking use included Cigarettes. She has a 37.50 pack-year smoking history. She has never used smokeless tobacco.  Past  Surgical History:  Procedure Laterality Date  . COLONOSCOPY N/A 01/31/2015   RMR: Multiple colonic polyps treated/removed as described above  . ESOPHAGOGASTRODUODENOSCOPY N/A 01/31/2015   RMR: mild changes of reflux esophagitis. Tiny hiatal hernia. Status post passage of a Maloney dilator.   Marland Kitchen MALONEY DILATION N/A 01/31/2015   Procedure: Venia Minks DILATION;  Surgeon: Daneil Dolin, MD;  Location: [facility];  Service: Endoscopy;  Laterality: N/A;    Allergies  Allergen Reactions  . Levaquin [Levofloxacin]     nausea  . Lisinopril Cough    Immunization History  Administered Date(s) Administered  . Influenza,inj,Quad PF,6+ Mos 12/18/2014, 12/19/2015  . Influenza-Unspecified 11/21/2016  . Tdap 02/23/2014  . Zoster 02/23/2014    Family History  Problem Relation Age of Onset  . Emphysema Father   . Emphysema Mother   . Lung cancer Maternal Grandfather   . Heart attack Maternal Grandmother      Current Outpatient Prescriptions:  .  albuterol (PROVENTIL HFA;VENTOLIN HFA) 108 (90 BASE) MCG/ACT inhaler, Inhale 2 puffs into the lungs every 6 (six) hours as needed for wheezing or shortness of breath., Disp: ,  Rfl:  .  albuterol (PROVENTIL) (2.5 MG/3ML) 0.083% nebulizer solution, Take 3 mLs (2.5 mg total) by nebulization every 6 (six) hours as needed for wheezing or shortness of breath., Disp: 75 mL, Rfl: 1 .  amLODipine (NORVASC) 10 MG tablet, Take 10 mg by mouth daily., Disp: , Rfl:  .  Azelastine-Fluticasone (DYMISTA) 137-50 MCG/ACT SUSP, Place 1 spray into the nose 2 (two) times daily., Disp: 6 g, Rfl: 0 .  cetirizine (ZYRTEC) 10 MG tablet, Take 10 mg by mouth daily., Disp: , Rfl:  .  losartan-hydrochlorothiazide (HYZAAR) 100-12.5 MG tablet, Take 1 tablet by mouth daily., Disp: , Rfl:  .  mometasone-formoterol (DULERA) 200-5 MCG/ACT AERO, Inhale 2 puffs into the lungs 2 (two) times daily at 10 AM and 5 PM., Disp: 8.8 g, Rfl: 1 .  montelukast (SINGULAIR) 10 MG tablet, Take 10 mg by  mouth daily., Disp: , Rfl:  .  Multiple Vitamins-Minerals (MULTIVITAMIN WITH MINERALS) tablet, Take 1 tablet by mouth daily., Disp: , Rfl:  .  norethindrone (AYGESTIN) 5 MG tablet, Take 2.5 mg by mouth daily., Disp: , Rfl:  .  Omega-3 Fatty Acids (FISH OIL) 1000 MG CAPS, Take 2 capsules by mouth daily., Disp: , Rfl:  .  ranitidine (ZANTAC) 150 MG tablet, Take 150 mg by mouth 2 (two) times daily., Disp: , Rfl:  .  Red Yeast Rice 600 MG CAPS, Take 2 capsules by mouth daily., Disp: , Rfl:  .  sertraline (ZOLOFT) 100 MG tablet, Take 100 mg by mouth daily., Disp: , Rfl:  .  Tiotropium Bromide Monohydrate (SPIRIVA RESPIMAT) 2.5 MCG/ACT AERS, Inhale 2 puffs into the lungs daily., Disp: , Rfl:      Review of Systems     Objective:   Physical Exam  Constitutional: She is oriented to person, place, and time. She appears well-developed and well-nourished. No distress.  HENT:  Head: Normocephalic and atraumatic.  Right Ear: External ear normal.  Left Ear: External ear normal.  Mouth/Throat: Oropharynx is clear and moist. No oropharyngeal exudate.  Eyes: Pupils are equal, round, and reactive to light. Conjunctivae and EOM are normal. Right eye exhibits no discharge. Left eye exhibits no discharge. No scleral icterus.  Neck: Normal range of motion. Neck supple. No JVD present. No tracheal deviation present. No thyromegaly present.  Cardiovascular: Normal rate, regular rhythm, normal heart sounds and intact distal pulses.  Exam reveals no gallop and no friction rub.   No murmur heard. Pulmonary/Chest: Effort normal and breath sounds normal. No respiratory distress. She has no wheezes. She has no rales. She exhibits no tenderness.  Mild wheeze  Abdominal: Soft. Bowel sounds are normal. She exhibits no distension and no mass. There is no tenderness. There is no rebound and no guarding.  Musculoskeletal: Normal range of motion. She exhibits no edema or tenderness.  Lymphadenopathy:    She has no  cervical adenopathy.  Neurological: She is alert and oriented to person, place, and time. She has normal reflexes. No cranial nerve deficit. She exhibits normal muscle tone. Coordination normal.  Skin: Skin is warm and dry. No rash noted. She is not diaphoretic. No erythema. No pallor.  Psychiatric: She has a normal mood and affect. Her behavior is normal. Judgment and thought content normal.  Vitals reviewed.   Vitals:   12/21/16 1150  BP: 122/76  Pulse: 77  SpO2: 97%  Weight: 216 lb 6.4 oz (98.2 kg)  Height: 5\' 2"  (1.575 m)    Estimated body mass index is 39.58 kg/m  as calculated from the following:   Height as of this encounter: 5\' 2"  (1.575 m).   Weight as of this encounter: 216 lb 6.4 oz (98.2 kg).     Assessment:       ICD-10-CM   1. COPD with acute exacerbation ([place]) J44.1   2. COPD, moderate ([place]) J44.9   3. Lung nodule R91.1        Plan:       COPD with acute exacerbation (HCC) COPD, moderate ([place])  - Please take prednisone 40 mg x1 day, then 30 mg x1 day, then 20 mg x1 day, then 10 mg x1 day, and then 5 mg x1 day and stop - Take doxycycline 100mg  po twice daily x 5 days; take after meals and avoid sunlight - contnue spiriva, dulera and singulair as before scheduled + albuterol as needd -   Lung nodule  - stable x 1  Year  - do repeat ct chest wo contrast in  1 year  Followup 12 months but after CT chest - sooner if needed

## 2016-12-21 NOTE — Patient Instructions (Signed)
ICD-10-CM   1. COPD with acute exacerbation ([place]) J44.1   2. COPD, moderate ([place]) J44.9   3. Lung nodule R91.1     COPD with acute exacerbation (HCC) COPD, moderate (HCC)  - Please take prednisone 40 mg x1 day, then 30 mg x1 day, then 20 mg x1 day, then 10 mg x1 day, and then 5 mg x1 day and stop - Take doxycycline 100mg  po twice daily x 5 days; take after meals and avoid sunlight - contnue spiriva, dulera and singulair as before scheduled + albuterol as needd -   Lung nodule  - stable x 1  Year  - do repeat ct chest wo contrast in  1 year  Followup 12 months but after CT chest - sooner if needed

## 2016-12-21 NOTE — Addendum Note (Signed)
Addended by: Lorretta Harp on: 12/21/2016 12:37 PM   Modules accepted: Orders

## 2016-12-31 DIAGNOSIS — Z6838 Body mass index (BMI) 38.0-38.9, adult: Secondary | ICD-10-CM | POA: Diagnosis not present

## 2016-12-31 DIAGNOSIS — J209 Acute bronchitis, unspecified: Secondary | ICD-10-CM | POA: Diagnosis not present

## 2016-12-31 DIAGNOSIS — R05 Cough: Secondary | ICD-10-CM | POA: Diagnosis not present

## 2017-01-26 ENCOUNTER — Encounter: Payer: Self-pay | Admitting: Allergy & Immunology

## 2017-01-26 ENCOUNTER — Ambulatory Visit: Payer: BLUE CROSS/BLUE SHIELD | Admitting: Allergy & Immunology

## 2017-01-26 VITALS — BP 122/70 | HR 90 | Resp 17

## 2017-01-26 DIAGNOSIS — J449 Chronic obstructive pulmonary disease, unspecified: Secondary | ICD-10-CM

## 2017-01-26 DIAGNOSIS — J3089 Other allergic rhinitis: Secondary | ICD-10-CM

## 2017-01-26 DIAGNOSIS — J302 Other seasonal allergic rhinitis: Secondary | ICD-10-CM | POA: Diagnosis not present

## 2017-01-26 DIAGNOSIS — R918 Other nonspecific abnormal finding of lung field: Secondary | ICD-10-CM | POA: Diagnosis not present

## 2017-01-26 NOTE — Patient Instructions (Addendum)
1. Asthma-COPD overlap syndrome - Lung function looks stable today.  - We will officially change you from [facility] to Symbicort (samples provided and copay card provided) - Daily controller medication(s): Symbicort 160/4.5 two puffs in the morning and two puffs at night + Spiriva one inhalation once daily - Rescue medications: albuterol 4 puffs every 4-6 hours as needed or albuterol nebulizer one vial puffs every 4-6 hours as needed - Asthma control goals:  * Full participation in all desired activities (may need albuterol before activity) * Albuterol use two time or less a week on average (not counting use with activity) * Cough interfering with sleep two time or less a month * Oral steroids no more than once a year * No hospitalizations - Continue with annual chest CTs to monitor pulmonary nodules with Dr. Chase Caller.   3. Perennial and seasonal allergic rhinitis - Continue with Dymista two sprays per nostril 1-2 times daily. - Continue with cetirizine 10mg  daily. - Consider starting allergy shots.  - Call you insurance company to ensure that you get coverage.   4. Return in about 3 months (around 04/26/2017).   Please inform us of any Emergency Department visits, hospitalizations, or changes in symptoms. Call us before going to the ED for breathing or allergy symptoms since we might be able to fit you in for a sick visit. Feel free to contact us anytime with any questions, problems, or concerns.  It was a pleasure to see you again today! Enjoy the holiday season!  Websites that have reliable patient information: 1. American Academy of Asthma, Allergy, and Immunology: www.aaaai.org 2. Food Allergy Research and Education (FARE): foodallergy.org 3. Mothers of Asthmatics: http://www.asthmacommunitynetwork.org 4. American College of Allergy, Asthma, and Immunology: www.acaai.org

## 2017-01-26 NOTE — Progress Notes (Signed)
FOLLOW UP  Date of Service/Encounter:  01/26/17   Assessment:   Asthma-COPD overlap syndrome  Seasonal and perennial allergic rhinitis (grasses, weeds, trees, molds, dust mites, cat, horse, and cockroach)  Pulmonary nodules - followed by Dr. Chase Caller (last chest CT October 2018 stable)  Plan/Recommendations:   1. Asthma-COPD overlap syndrome - Lung function looks stable today.  - We will officially change you from [facility] to Symbicort (samples provided and copay card provided) in order to provide a $0 co-pay card. - We could consider the change to Trelegy at the next visit if there is no improvement.  - Daily controller medication(s): Symbicort 160/4.5 two puffs in the morning and two puffs at night + Spiriva one inhalation once daily - Rescue medications: albuterol 4 puffs every 4-6 hours as needed or albuterol nebulizer one vial puffs every 4-6 hours as needed - Asthma control goals:  * Full participation in all desired activities (may need albuterol before activity) * Albuterol use two time or less a week on average (not counting use with activity) * Cough interfering with sleep two time or less a month * Oral steroids no more than once a year * No hospitalizations - Continue with annual chest CTs to monitor pulmonary nodules with Dr. Chase Caller.   3. Perennial and seasonal allergic rhinitis (grasses, weeds, trees, molds, dust mites, cat, horse, and cockroach) - Continue with Dymista two sprays per nostril 1-2 times daily. - Continue with cetirizine 10mg  daily. - Consider starting allergy shots.  - Call you insurance company to ensure that you get coverage.  4. Return in about 3 months (around 04/26/2017).  Subjective:   Alicia Hall is a 61 y.o. female presenting today for follow up of  Chief Complaint  Patient presents with  . Asthma  . Follow-up  . Cough  . Nasal Congestion    Alicia Hall has a history of the following: Patient Active Problem List   Diagnosis Date Noted  . Pulmonary nodules 09/22/2016  . Asthma-COPD overlap syndrome ([place]) 07/28/2016  . COPD, moderate ([place]) 07/05/2015  . Hypersomnia with sleep apnea 06/19/2015  . OSA (obstructive sleep apnea) 06/19/2015  . Depression 06/19/2015  . Insomnia 06/19/2015  . Lung nodule 03/08/2015  . History of colonic polyps   . GERD (gastroesophageal reflux disease) 01/25/2015  . Dysphagia 01/25/2015  . Encounter for screening colonoscopy 01/25/2015  . COPD (chronic obstructive pulmonary disease) ([place]) 12/18/2014  . Moderate persistent asthma 11/23/2014  . Dyspnea and respiratory abnormality 11/23/2014  . Smoking history 11/23/2014  . Upper airway cough syndrome 11/23/2014  . Perennial allergic rhinitis 11/15/2014    History obtained from: chart review and patient.  Alicia Hall's Primary Care Provider is Caryl Bis, MD.     Alicia Hall is a 61 y.o. female presenting for a follow up visit. She was last seen in July 2018. At that time, we started her on Augmentin for sinusitis. She had spiro that showed evidence of obstructive disease. We continued her on Symbicort two puffs BID as well as Spiriva two inhalations daily. For her allergic rhinitis, we continued her on Dymista two sprays per nostril 1-2 times daily.   Since the last visit, she has mostly done well. She is doing fine on her current regimen. She continues to get prednisone from various sources, but since the last visit, she has only needed one prednisone burst around one month ago. Despite being changed to Symbicort at the last visit, she remains on [facility]. Evidently, it  is costing her around $20 per month, but we have been providing her with samples. She is currently pays $20 for her [facility]. She remains on the Spiriva two puffs once daily as well. We had discussed putting her on a biologic, but she has never gotten the CBC or IgE level done. Evidently, she has a balance with the lab and did not want to increase her debt any  more. She denies night time coughing and day time coughing and wheezing.  Allergic rhinitis is not well controlled. She remains on Dymista, but it is not clear how often she is doing this. She is interested in allergy injections. Her last testing was done in 2015 and was positive to multiple allergens.       Otherwise, there have been no changes to her past medical history, surgical history, family history, or social history. She did have a chest CT done in October 2018 to monitor her pulmonary nodules. Thankfully, this was stable. She gets chest CT every 12 months.  IMPRESSION: 1. Stable bilateral pulmonary nodules, most compatible with benign etiology given stability over time. 2. Similar appearance interstitial lung disease, likely sequelae of remote infection. 3. Aortic atherosclerosis.    Review of Systems: a 14-point review of systems is pertinent for what is mentioned in HPI.  Otherwise, all other systems were negative. Constitutional: negative other than that listed in the HPI Eyes: negative other than that listed in the HPI Ears, nose, mouth, throat, and face: negative other than that listed in the HPI Respiratory: negative other than that listed in the HPI Cardiovascular: negative other than that listed in the HPI Gastrointestinal: negative other than that listed in the HPI Genitourinary: negative other than that listed in the HPI Integument: negative other than that listed in the HPI Hematologic: negative other than that listed in the HPI Musculoskeletal: negative other than that listed in the HPI Neurological: negative other than that listed in the HPI Allergy/Immunologic: negative other than that listed in the HPI    Objective:   Blood pressure 122/70, pulse 90, resp. rate 17, SpO2 94 %. There is no height or weight on file to calculate BMI.   Physical Exam:  General: Alert, interactive, in no acute distress. Eyes: No conjunctival injection bilaterally, no  discharge on the right, no discharge on the left, no Horner-Trantas dots present and allergic shiners present bilaterally. PERRL bilaterally. EOMI without pain. No photophobia.  Ears: Right TM pearly gray with normal light reflex, Left TM pearly gray with normal light reflex, Right TM intact without perforation and Left TM intact without perforation.  Nose/Throat: External nose within normal limits, nasal crease present and septum midline. Turbinates markedly edematous and pale with clear discharge. Posterior oropharynx erythematous without cobblestoning in the posterior oropharynx. Tonsils 2+ without exudates.  Tongue without thrush. Adenopathy: shoddy bilateral anterior cervical lymphadenopathy and no enlarged lymph nodes appreciated in the occipital, axillary, epitrochlear, inguinal, or popliteal regions. Lungs: Clear to auscultation without wheezing, rhonchi or rales. No increased work of breathing. CV: Normal S1/S2. No murmurs. Capillary refill <2 seconds.  Skin: Warm and dry, without lesions or rashes. Neuro:   Grossly intact. No focal deficits appreciated. Responsive to questions.  Diagnostic studies:   Spirometry: results abnormal (FEV1: 1.20/59%, FVC: 2.10/86%, FEV1/FVC: 57%).    Spirometry consistent with moderate obstructive disease. Overall, values are stable compared to previous numbers.   Allergy Studies: none     Salvatore Marvel, MD [facility] [place]

## 2017-02-08 ENCOUNTER — Telehealth: Payer: Self-pay

## 2017-02-08 DIAGNOSIS — J209 Acute bronchitis, unspecified: Secondary | ICD-10-CM | POA: Diagnosis not present

## 2017-02-08 DIAGNOSIS — Z6839 Body mass index (BMI) 39.0-39.9, adult: Secondary | ICD-10-CM | POA: Diagnosis not present

## 2017-02-08 DIAGNOSIS — R05 Cough: Secondary | ICD-10-CM | POA: Diagnosis not present

## 2017-02-09 NOTE — Telephone Encounter (Signed)
Pt

## 2017-02-25 DIAGNOSIS — H43812 Vitreous degeneration, left eye: Secondary | ICD-10-CM | POA: Diagnosis not present

## 2017-03-04 DIAGNOSIS — J209 Acute bronchitis, unspecified: Secondary | ICD-10-CM | POA: Diagnosis not present

## 2017-03-04 DIAGNOSIS — J441 Chronic obstructive pulmonary disease with (acute) exacerbation: Secondary | ICD-10-CM | POA: Diagnosis not present

## 2017-03-04 DIAGNOSIS — R05 Cough: Secondary | ICD-10-CM | POA: Diagnosis not present

## 2017-03-04 DIAGNOSIS — Z6839 Body mass index (BMI) 39.0-39.9, adult: Secondary | ICD-10-CM | POA: Diagnosis not present

## 2017-03-17 DIAGNOSIS — E782 Mixed hyperlipidemia: Secondary | ICD-10-CM | POA: Diagnosis not present

## 2017-03-17 DIAGNOSIS — I10 Essential (primary) hypertension: Secondary | ICD-10-CM | POA: Diagnosis not present

## 2017-03-17 DIAGNOSIS — E6609 Other obesity due to excess calories: Secondary | ICD-10-CM | POA: Diagnosis not present

## 2017-03-17 DIAGNOSIS — Z23 Encounter for immunization: Secondary | ICD-10-CM | POA: Diagnosis not present

## 2017-03-17 DIAGNOSIS — G4733 Obstructive sleep apnea (adult) (pediatric): Secondary | ICD-10-CM | POA: Diagnosis not present

## 2017-04-13 DIAGNOSIS — J441 Chronic obstructive pulmonary disease with (acute) exacerbation: Secondary | ICD-10-CM | POA: Diagnosis not present

## 2017-04-13 DIAGNOSIS — Z6839 Body mass index (BMI) 39.0-39.9, adult: Secondary | ICD-10-CM | POA: Diagnosis not present

## 2017-04-27 ENCOUNTER — Ambulatory Visit: Payer: BLUE CROSS/BLUE SHIELD | Admitting: Allergy & Immunology

## 2017-05-17 ENCOUNTER — Telehealth: Payer: Self-pay | Admitting: Allergy & Immunology

## 2017-05-17 DIAGNOSIS — J209 Acute bronchitis, unspecified: Secondary | ICD-10-CM | POA: Diagnosis not present

## 2017-05-17 DIAGNOSIS — J309 Allergic rhinitis, unspecified: Secondary | ICD-10-CM | POA: Diagnosis not present

## 2017-05-17 DIAGNOSIS — J439 Emphysema, unspecified: Secondary | ICD-10-CM | POA: Diagnosis not present

## 2017-05-17 DIAGNOSIS — R05 Cough: Secondary | ICD-10-CM | POA: Diagnosis not present

## 2017-05-17 NOTE — Telephone Encounter (Signed)
Pt called and said to let Beth know she has try calling the [facility] and they say that the volume is very large  And want to see if you can call to see if they will cover the inj. [phone].

## 2017-05-18 NOTE — Telephone Encounter (Signed)
Reviewed her note. Her last testing was from 2016, so there is no need to do additional testing. She is also allergic to nearly our entire panel, so I do not think that more testing would add much to her care and would only increase the cost.   With all of her sinus infections, we may need to do an immune workup. But the most bang for the buck might be getting her complete blood count to see if she would qualify for either Fasenra or Nucala. We could reach out to her PCP to see if she has had a CBC collected there at some point within the last year.   We will work on talking to her insurance company about copayments. Karena Addison said that she would call [employer] this week.  Salvatore Marvel, MD Allergy and [place] [facility] [place]

## 2017-05-18 NOTE — Telephone Encounter (Signed)
Please advise 

## 2017-05-18 NOTE — Telephone Encounter (Signed)
Patient is calling to inform Dr Ernst Bowler that she has had multiple sinus infections. Her PCP currently has her on prednisone 20mg  2x a day for 5 days. She also received a decadron mg IM injection. Her PCP is wondering if she needs another allergy test done. Patient is has been trying to get in contact with her insurance for 2 weeks now to see if allergy injections are approved but has not had any success. She is still waiting to get blood work done due to the cost. She is currently paying on multiple medical bills. Not sure if it will be cheaper for her to come over to [place] and get it done in our office.   Please Advise  Thanks

## 2017-05-19 NOTE — Telephone Encounter (Signed)
Called [name]'s office and spoke to [name].  Alicia Hall had routine blood work done 11/09/16.  This did include a CBC.  Results will be faxed to our office for [name] to review.

## 2017-05-19 NOTE — Telephone Encounter (Signed)
Called and spoke with patient.  Alicia Hall states she has not had any labs done with her PCP recently.  Informed patient we would contact her PCP to see if any additional labs had been done in the past year and we would get back with her after speaking with [employer].

## 2017-05-20 NOTE — Telephone Encounter (Addendum)
We received a CBC from Alicia Hall's PCP showed that she had an elevated AEC of 400 in September 2018. This is the most recent CBC that she has had collected. It would qualify her for either Fasenra or Nucala.   However, I want to make sure that she is actually using her controller medication. Can we reach out to her to check on whether she has been using her [facility] on a daily basis? We can certainly submit for Fasenra or Nucala, but I want to make sure that she is compliant with her medication first.     Salvatore Marvel, MD Allergy and [place] [facility]

## 2017-05-20 NOTE — Telephone Encounter (Signed)
Called and spoke with patient.  Patient states she is taking Symbicort 160 mcg 2 puffs bid and Spiriva Respimat 2.5 mcg daily.   Patient states she does have to use Albuterol daily over the past month.  Patient was not able to afford [facility].  Patient voiced understanding of submitting for biologic approval.

## 2017-05-21 NOTE — Telephone Encounter (Signed)
Noted. Ok since she is still having symptoms even with compliance of her inhalers, I think we should go ahead and submit for a biologic. Tammy - can you run the numbers and see which would be cheaper for Ms. Hunley? Thanks! If they are similar, let's do with Nucala.   Salvatore Marvel, MD Allergy and [place] [facility] [place]

## 2017-05-24 NOTE — Telephone Encounter (Signed)
I will work on same and get in contact with patient

## 2017-05-27 NOTE — Telephone Encounter (Signed)
Benefits for allergy injections and vials were confirmed with [employer] by Lelan Pons.  Patient has to meet her deductible of $2,400 and then they will be covered at 100%.  Patient was notified.

## 2017-05-28 DIAGNOSIS — G4733 Obstructive sleep apnea (adult) (pediatric): Secondary | ICD-10-CM | POA: Diagnosis not present

## 2017-06-01 ENCOUNTER — Encounter: Payer: Self-pay | Admitting: Allergy & Immunology

## 2017-06-01 ENCOUNTER — Ambulatory Visit: Payer: BLUE CROSS/BLUE SHIELD | Admitting: Allergy & Immunology

## 2017-06-01 VITALS — BP 148/78 | HR 78 | Temp 97.9°F | Resp 18

## 2017-06-01 DIAGNOSIS — J45901 Unspecified asthma with (acute) exacerbation: Secondary | ICD-10-CM

## 2017-06-01 DIAGNOSIS — J302 Other seasonal allergic rhinitis: Secondary | ICD-10-CM | POA: Diagnosis not present

## 2017-06-01 DIAGNOSIS — J3089 Other allergic rhinitis: Secondary | ICD-10-CM

## 2017-06-01 DIAGNOSIS — J441 Chronic obstructive pulmonary disease with (acute) exacerbation: Secondary | ICD-10-CM | POA: Diagnosis not present

## 2017-06-01 MED ORDER — METHYLPREDNISOLONE ACETATE 40 MG/ML IJ SUSP
40.0000 mg | Freq: Once | INTRAMUSCULAR | Status: AC
  Administered 2017-06-01: 40 mg via INTRAMUSCULAR

## 2017-06-01 NOTE — Progress Notes (Signed)
FOLLOW UP  Date of Service/Encounter:  06/01/17   Assessment:   Asthma-COPD overlap syndrome  Seasonal and perennial allergic rhinitis (grasses, weeds, trees, molds, dust mites, cat, horse, and cockroach)  Pulmonary nodules - followed by Dr. Chase Caller (last chest CT October 2018 stable)  History of non-compliance due to medication cost  Plan/Recommendations:   1. Asthma-COPD overlap syndrome - with acute exacerbation - Lung function looked much lower today, but it did improve with the albuterol nebulizer. - Injection of DepoMedrol 40mg  provided today to control inflammation. - We will not make any medication changes today.  - Be sure to get that lab work so we can see if you qualify for the injectable medication.  - I anticipate that the Xolair card will provide fairly good coverage of the Xolair, but it seems that Ms. Petion prefers completely free medications instead.  - Risks of recurrent steroid courses discussed. - Spacer use reviewed. - Daily controller medication(s): Singulair 10mg  daily, Symbicort 160/4.80mcg two puffs twice daily with spacer and Spiriva 1.88mcg two puffs once daily - Prior to physical activity: ProAir 2 puffs 10-15 minutes before physical activity. - Rescue medications: ProAir 4 puffs every 4-6 hours as needed or albuterol nebulizer one vial every 4-6 hours as needed - Asthma control goals:  * Full participation in all desired activities (may need albuterol before activity) * Albuterol use two time or less a week on average (not counting use with activity) * Cough interfering with sleep two time or less a month * Oral steroids no more than once a year * No hospitalizations  2. Seasonal and perennial allergic rhinitis - Continue with Dymista 1-2 sprays per nostril up to twice daily. - Continue with cetirizine 10mg  daily. - Continue with montelukast (Singulair) 10mg  daily. - Consider allergy shots, especially if you think you will reach your  deductible this year anyway.  3. Return in about 3 months (around 08/31/2017).    Subjective:   GENNI BUSKE is a 62 y.o. female presenting today for follow up of  Chief Complaint  Patient presents with  . Asthma    states her asthma started flaring up about a week ago. loss of voice about 3-4 days ago. wheezing and shortness of breath. denies fever/ muscle pain/body aches/nausea and vomiting.     Nicanor Alcon Swartzendruber has a history of the following: Patient Active Problem List   Diagnosis Date Noted  . Pulmonary nodules 09/22/2016  . Asthma-COPD overlap syndrome ([place]) 07/28/2016  . COPD, moderate ([place]) 07/05/2015  . Hypersomnia with sleep apnea 06/19/2015  . OSA (obstructive sleep apnea) 06/19/2015  . Depression 06/19/2015  . Insomnia 06/19/2015  . Lung nodule 03/08/2015  . History of colonic polyps   . GERD (gastroesophageal reflux disease) 01/25/2015  . Dysphagia 01/25/2015  . Encounter for screening colonoscopy 01/25/2015  . COPD (chronic obstructive pulmonary disease) ([place]) 12/18/2014  . Moderate persistent asthma 11/23/2014  . Dyspnea and respiratory abnormality 11/23/2014  . Smoking history 11/23/2014  . Upper airway cough syndrome 11/23/2014  . Perennial allergic rhinitis 11/15/2014    History obtained from: chart review and patient.  Nicanor Alcon Lebron's Primary Care Provider is Caryl Bis, MD.     Jannet is a 62 y.o. female presenting for a follow up visit.  She was last seen in December 2018.  At that time, her lung function appeared stable.  We changed her from [facility] to Symbicort due to $0 copayment card.  We also continued her on Spiriva 2  puffs once daily.  She does have a history of pulmonary nodules and is followed by Dr. Pricilla Holm swami.  She receives annual chest CTs.  She has a history of marketed perennial and seasonal allergic rhinitis with sensitization to grasses, weeds, trees, molds, dust mite, cat, horse, and cockroach.  We continued her on Dymista 2 sprays  per nostril up to twice daily and cetirizine 10 mg daily.  We also recommended allergy shots, but her deductible has kept this from becoming reality.   Since the last visit, she has not done well. She has been using her nebulizer for one month. But symptoms have worsened over the last week. She has felt that she has wanted to be outside durinr symptoms instead of the "asthma shot" or allergy shots. g the spring and the fall. Her husband was working on the [employer] and she noticed that there was a birds nest above the light. She started knocking this dust and dirt off which may have triggered her worst symptoms. She remains on the Symbicort two puffs twice daily. She is also on the Spiriva two puffs once daily. She is unsure of the cost since she has been surviving on samples from [place]. ACT today is 9, indicating very poor control. She has not received her allergen panel blood work, which would have provided [place] with an IgE level for Xolair initiation.   We have discussed starting Xolair in the recent past. It is unclear where we are in this approval process But she needs lab work performed to see what her IgE level is. She had steroid injection in January 2019 and was started on Augmentin for a sinus infection. She is wondering together whether she can just get intermittent steroid injections to control he  Otherwise, there have been no changes to her past medical history, surgical history, family history, or social history.    Review of Systems: a 14-point review of systems is pertinent for what is mentioned in HPI.  Otherwise, all other systems were negative. Constitutional: negative other than that listed in the HPI Eyes: negative other than that listed in the HPI Ears, nose, mouth, throat, and face: negative other than that listed in the HPI Respiratory: negative other than that listed in the HPI Cardiovascular: negative other than that listed in the HPI Gastrointestinal: negative other than that  listed in the HPI Genitourinary: negative other than that listed in the HPI Integument: negative other than that listed in the HPI Hematologic: negative other than that listed in the HPI Musculoskeletal: negative other than that listed in the HPI Neurological: negative other than that listed in the HPI Allergy/Immunologic: negative other than that listed in the HPI    Objective:   Blood pressure (!) 148/78, pulse 78, temperature 97.9 F (36.6 C), temperature source Oral, resp. rate 18, SpO2 98 %. There is no height or weight on file to calculate BMI.   Physical Exam:  General: Alert, interactive, in no acute distress. Speaking in full sentences.  Eyes: No conjunctival injection bilaterally, no discharge on the right, no discharge on the left, no Horner-Trantas dots present and allergic shiners present bilaterally. PERRL bilaterally. EOMI without pain. No photophobia.  Ears: Right TM pearly gray with normal light reflex, Left TM pearly gray with normal light reflex, Right TM intact without perforation and Left TM intact without perforation.  Nose/Throat: External nose within normal limits and septum midline. Turbinates edematous and pale with clear discharge. Posterior oropharynx erythematous with cobblestoning in the posterior  oropharynx. Tonsils 2+ without exudates.  Tongue without thrush. Lungs: Decreased breath sounds with expiratory wheezing bilaterally. No increased work of breathing. CV: Normal S1/S2. No murmurs. Capillary refill <2 seconds.  Skin: Warm and dry, without lesions or rashes. Neuro:   Grossly intact. No focal deficits appreciated. Responsive to questions.  Diagnostic studies:   Spirometry: results abnormal (FEV1: 1.03/49%, FVC: 1.47/51%, FEV1/FVC: 70%).    Spirometry consistent with possible restrictive disease. Albuterol/Atrovent nebulizer treatment given in clinic with significant improvement in FEV1 and FVC per ATS criteria.  Allergy Studies: none     Salvatore Marvel, MD [place] [facility] [place]

## 2017-06-01 NOTE — Patient Instructions (Addendum)
1. Asthma-COPD overlap syndrome ([place]) - Lung function looked much lower today, but it did improve with the albuterol nebulizer. - We will not make any medication changes today.  - Be sure to get that lab work so we can see if you qualify for the injectable medication.  - Spacer use reviewed. - Daily controller medication(s): Singulair 10mg  daily, Symbicort 160/4.39mcg two puffs twice daily with spacer and Spiriva 1.25mcg two puffs once daily - Prior to physical activity: ProAir 2 puffs 10-15 minutes before physical activity. - Rescue medications: ProAir 4 puffs every 4-6 hours as needed or albuterol nebulizer one vial every 4-6 hours as needed - Asthma control goals:  * Full participation in all desired activities (may need albuterol before activity) * Albuterol use two time or less a week on average (not counting use with activity) * Cough interfering with sleep two time or less a month * Oral steroids no more than once a year * No hospitalizations  2. Seasonal and perennial allergic rhinitis - Continue with Dymista 1-2 sprays per nostril up to twice daily. - Continue with cetirizine 10mg  daily. - Continue with montelukast (Singulair) 10mg  daily. - Consider allergy shots, especially if you think you will reach your deductible this year anyway.  3. Return in about 3 months (around 08/31/2017).   Please inform us of any Emergency Department visits, hospitalizations, or changes in symptoms. Call us before going to the ED for breathing or allergy symptoms since we might be able to fit you in for a sick visit. Feel free to contact us anytime with any questions, problems, or concerns.  It was a pleasure to see you again today!  Websites that have reliable patient information: 1. American Academy of Asthma, Allergy, and Immunology: www.aaaai.org 2. Food Allergy Research and Education (FARE): foodallergy.org 3. Mothers of Asthmatics: http://www.asthmacommunitynetwork.org 4. American College of  Allergy, Asthma, and Immunology: www.acaai.org

## 2017-06-04 ENCOUNTER — Telehealth: Payer: Self-pay | Admitting: Allergy & Immunology

## 2017-06-04 DIAGNOSIS — J4540 Moderate persistent asthma, uncomplicated: Secondary | ICD-10-CM | POA: Diagnosis not present

## 2017-06-04 MED ORDER — PREDNISONE 10 MG PO TABS: ORAL_TABLET | ORAL | 0 refills | Status: DC

## 2017-06-04 NOTE — Telephone Encounter (Signed)
I left a detailed message advising patient of the medication instructions.

## 2017-06-04 NOTE — Telephone Encounter (Signed)
Pt called and said that she was a little better but not much and wanted to see if you would call in predisone because she said that it helps.[phone].

## 2017-06-04 NOTE — Telephone Encounter (Signed)
Script written for nine day prednisone taper. Please call patient to let her know.  Salvatore Marvel, MD Allergy and [place] [facility] [place]

## 2017-06-11 LAB — CBC WITH DIFFERENTIAL/PLATELET
BASOS ABS: 0 10*3/uL (ref 0.0–0.2)
Basos: 0 %
EOS (ABSOLUTE): 1.1 10*3/uL — ABNORMAL HIGH (ref 0.0–0.4)
Eos: 11 %
Hematocrit: 37.8 % (ref 34.0–46.6)
Hemoglobin: 12.6 g/dL (ref 11.1–15.9)
IMMATURE GRANS (ABS): 0 10*3/uL (ref 0.0–0.1)
IMMATURE GRANULOCYTES: 0 %
LYMPHS: 29 %
Lymphocytes Absolute: 2.7 10*3/uL (ref 0.7–3.1)
MCH: 28.7 pg (ref 26.6–33.0)
MCHC: 33.3 g/dL (ref 31.5–35.7)
MCV: 86 fL (ref 79–97)
Monocytes Absolute: 0.4 10*3/uL (ref 0.1–0.9)
Monocytes: 5 %
NEUTROS PCT: 55 %
Neutrophils Absolute: 5.1 10*3/uL (ref 1.4–7.0)
Platelets: 281 10*3/uL (ref 150–379)
RBC: 4.39 x10E6/uL (ref 3.77–5.28)
RDW: 14.1 % (ref 12.3–15.4)
WBC: 9.3 10*3/uL (ref 3.4–10.8)

## 2017-06-11 LAB — ALLERGENS, ZONE 3
Alternaria Alternata IgE: <0.1 kU/L
Bahia Grass IgE: <0.1 kU/L
Cat Dander IgE: <0.1 kU/L
Common Silver Birch IgE: <0.1 kU/L
D Farinae IgE: <0.1 kU/L
Elm, American IgE: <0.1 kU/L
Hickory, White IgE: <0.1 kU/L
Johnson Grass IgE: <0.1 kU/L
Kentucky Bluegrass IgE: <0.1 kU/L
M002-IGE CLADOSPORIUM HERBARUM: 0.26 kU/L — AB
M003-IGE ASPERGILLUS FUMIGATUS: 0.28 kU/L — AB
Mucor Racemosus IgE: <0.1 kU/L
Oak, White IgE: <0.1 kU/L
Pigweed, Rough IgE: <0.1 kU/L
Plantain, English IgE: <0.1 kU/L
Ragweed, Short IgE: <0.1 kU/L
Stemphylium Herbarum IgE: <0.1 kU/L
White Mulberry IgE: <0.1 kU/L

## 2017-06-11 LAB — IGE: IGE (IMMUNOGLOBULIN E), SERUM: 158 [IU]/mL (ref 6–495)

## 2017-06-29 ENCOUNTER — Encounter: Payer: Self-pay | Admitting: Allergy & Immunology

## 2017-06-29 ENCOUNTER — Ambulatory Visit: Payer: BLUE CROSS/BLUE SHIELD | Admitting: Allergy & Immunology

## 2017-06-29 VITALS — BP 130/64 | HR 88 | Temp 98.3°F | Resp 18

## 2017-06-29 DIAGNOSIS — J3089 Other allergic rhinitis: Secondary | ICD-10-CM | POA: Diagnosis not present

## 2017-06-29 DIAGNOSIS — J4551 Severe persistent asthma with (acute) exacerbation: Secondary | ICD-10-CM | POA: Diagnosis not present

## 2017-06-29 DIAGNOSIS — J449 Chronic obstructive pulmonary disease, unspecified: Secondary | ICD-10-CM | POA: Diagnosis not present

## 2017-06-29 DIAGNOSIS — J302 Other seasonal allergic rhinitis: Secondary | ICD-10-CM

## 2017-06-29 MED ORDER — METHYLPREDNISOLONE ACETATE 80 MG/ML IJ SUSP
80.0000 mg | Freq: Once | INTRAMUSCULAR | Status: AC
  Administered 2017-06-29: 80 mg via INTRAMUSCULAR

## 2017-06-29 MED ORDER — ALBUTEROL SULFATE HFA 108 (90 BASE) MCG/ACT IN AERS: 2.0000 | INHALATION_SPRAY | RESPIRATORY_TRACT | 1 refills | Status: DC | PRN

## 2017-06-29 NOTE — Progress Notes (Signed)
FOLLOW UP  Date of Service/Encounter:  06/29/17   Assessment:   Asthma-COPD overlap syndrome - with eosinophilic phenotype  Seasonal and perennial allergic rhinitis(grasses, weeds, trees, molds, dust mites, cat, horse, and cockroach)  Pulmonary nodules- followed by Dr. Chase Caller (last chest CT October 2018 stable)  History of non-compliance due to medication cost   Alicia Hall presents again with a sick visit one month after her last visit with [place] for similar symptoms. She does continue to respond well to prednisone.  We even got her on a regimen that she was able to afford.  She has not picked up a Spiriva yet, as she continues to get samples from [place].  However, the Symbicort is affordable at $0.  We have gotten her approved for new Calla, but unfortunately she has not started this yet.  Her lung function continues to deteriorate, and we provided a Depo-Medrol injection as well as a prednisone course.  If she continues to do this badly, we will have to get a repeat chest CT and have her see Dr. Chase Caller again.  We are going to bring a sample of Nucala next week to initiate control of her asthma with an eosinophilic phenotype.   Plan/Recommendations:   1. Asthma-COPD overlap syndrome - Lung function looked much lower today, but it did improve with the albuterol nebulizer. - Keep a lookout for the copay card in the mail and give [place] a call when you get that.  - Start the prednisone pack provided today. - Daily controller medication(s): Spiriva 2.42mcg one puff once daily, Singulair 10mg  daily and Symbicort 160/4.1mcg two puffs twice daily with spacer - Prior to physical activity: ProAir 2 puffs 10-15 minutes before physical activity. - Rescue medications: ProAir 4 puffs every 4-6 hours as needed or albuterol nebulizer one vial every 4-6 hours as needed - Changes during respiratory infections or worsening symptoms: Add on Asmanex 137mcg to 2 puffs twice daily for ONE TO TWO WEEKS. -  Asthma control goals:  * Full participation in all desired activities (may need albuterol before activity) * Albuterol use two time or less a week on average (not counting use with activity) * Cough interfering with sleep two time or less a month * Oral steroids no more than once a year * No hospitalizations  2. Seasonal and perennial allergic rhinitis - Continue with Dymista 1-2 sprays per nostril up to twice daily. - Continue with cetirizine 10mg  daily. - Continue with montelukast (Singulair) 10mg  daily. - Consider allergy shots, especially if you think you will reach your deductible this year anyway.  3. Return in about 2 months (around 08/29/2017).    Subjective:   Alicia Hall is a 62 y.o. female presenting today for follow up of  Chief Complaint  Patient presents with  . Asthma  . Cough  . Nasal Congestion    Alicia Hall has a history of the following: Patient Active Problem List   Diagnosis Date Noted  . Pulmonary nodules 09/22/2016  . Asthma-COPD overlap syndrome ([place]) 07/28/2016  . COPD, moderate ([place]) 07/05/2015  . Hypersomnia with sleep apnea 06/19/2015  . OSA (obstructive sleep apnea) 06/19/2015  . Depression 06/19/2015  . Insomnia 06/19/2015  . Lung nodule 03/08/2015  . History of colonic polyps   . GERD (gastroesophageal reflux disease) 01/25/2015  . Dysphagia 01/25/2015  . Encounter for screening colonoscopy 01/25/2015  . COPD (chronic obstructive pulmonary disease) ([place]) 12/18/2014  . Moderate persistent asthma 11/23/2014  . Dyspnea and respiratory abnormality  11/23/2014  . Smoking history 11/23/2014  . Upper airway cough syndrome 11/23/2014  . Perennial allergic rhinitis 11/15/2014    History obtained from: chart review and patient.  Alicia Hall's Primary Care Provider is Caryl Bis, MD.     Alicia Hall is a 62 y.o. female presenting for a sick visit.  She was last seen in April 2019.  At that time, we diagnosed her with an asthma  exacerbation.  We gave her Depo-Medrol 40 mg to control inflammation.  Environmental allergen panel was positive only for a couple of molds.  Her complete blood count demonstrated an elevated absolute eosinophil count of 1100.  We did discuss starting an anti-IL-5 medication.  Following the visit, she did call back to request prednisone since her symptoms rebounded.  We sent in a 9-day prednisone taper.   Since the last visit, she has mostly done well. She did take the prednisone from the last visit and felt much better. She went to the beach with her daughter and then developed some nasal congestion. They returned on Sunday April 28th. She has had worsening problems since that time. She reports wheezing and coughing with SOB. ACT today is 13 today, indicating poor asthma control.   She has had eye watering and rhinorrhea. She has been having problems with nasal discharge, mostly clear but occasionally green. She reports sinus pressure.   Otherwise, there have been no changes to her past medical history, surgical history, family history, or social history.    Review of Systems: a 14-point review of systems is pertinent for what is mentioned in HPI.  Otherwise, all other systems were negative. Constitutional: negative other than that listed in the HPI Eyes: negative other than that listed in the HPI Ears, nose, mouth, throat, and face: negative other than that listed in the HPI Respiratory: negative other than that listed in the HPI Cardiovascular: negative other than that listed in the HPI Gastrointestinal: negative other than that listed in the HPI Genitourinary: negative other than that listed in the HPI Integument: negative other than that listed in the HPI Hematologic: negative other than that listed in the HPI Musculoskeletal: negative other than that listed in the HPI Neurological: negative other than that listed in the HPI Allergy/Immunologic: negative other than that listed in the  HPI    Objective:   Blood pressure 130/64, pulse 88, temperature 98.3 F (36.8 C), temperature source Oral, resp. rate 18, SpO2 95 %. There is no height or weight on file to calculate BMI.   Physical Exam:  General: Alert, interactive, in no acute distress. Pleasant.  Eyes: No conjunctival injection bilaterally, no discharge on the right, no discharge on the left and no Horner-Trantas dots present. PERRL bilaterally. EOMI without pain. No photophobia.  Ears: Right TM pearly gray with normal light reflex, Left TM pearly gray with normal light reflex, Right TM intact without perforation and Left TM intact without perforation.  Nose/Throat: External nose within normal limits and septum midline. Turbinates edematous with clear discharge. Posterior oropharynx erythematous without cobblestoning in the posterior oropharynx. Tonsils 2+ without exudates.  Tongue without thrush. Lungs: Decreased breath sounds with expiratory wheezing bilaterally. Increased work of breathing. CV: Normal S1/S2. No murmurs. Capillary refill <2 seconds.  Skin: Warm and dry, without lesions or rashes. Neuro:   Grossly intact. No focal deficits appreciated. Responsive to questions.  Diagnostic studies:   Spirometry: results abnormal (FEV1: 0.74/36%, FVC: 1.09/38%, FEV1/FVC: 67%).    Spirometry consistent with possible restrictive disease. Albuterol/Atrovent  nebulizer treatment given in clinic with no improvement.  Allergy Studies: none     Salvatore Marvel, MD  [facility]

## 2017-06-29 NOTE — Patient Instructions (Addendum)
1. Asthma-COPD overlap syndrome - Lung function looked much lower today, but it did improve with the albuterol nebulizer. - Keep a lookout for the copay card in the mail and give [place] a call when you get that.  - Start the prednisone pack provided today. - Daily controller medication(s): Spiriva 2.47mcg one puff once daily, Singulair 10mg  daily and Symbicort 160/4.19mcg two puffs twice daily with spacer - Prior to physical activity: ProAir 2 puffs 10-15 minutes before physical activity. - Rescue medications: ProAir 4 puffs every 4-6 hours as needed or albuterol nebulizer one vial every 4-6 hours as needed - Changes during respiratory infections or worsening symptoms: Add on Asmanex 141mcg to 2 puffs twice daily for ONE TO TWO WEEKS. - Asthma control goals:  * Full participation in all desired activities (may need albuterol before activity) * Albuterol use two time or less a week on average (not counting use with activity) * Cough interfering with sleep two time or less a month * Oral steroids no more than once a year * No hospitalizations  2. Seasonal and perennial allergic rhinitis - Continue with Dymista 1-2 sprays per nostril up to twice daily. - Continue with cetirizine 10mg  daily. - Continue with montelukast (Singulair) 10mg  daily. - Consider allergy shots, especially if you think you will reach your deductible this year anyway.  3. Return in about 2 months (around 08/29/2017).   Please inform us of any Emergency Department visits, hospitalizations, or changes in symptoms. Call us before going to the ED for breathing or allergy symptoms since we might be able to fit you in for a sick visit. Feel free to contact us anytime with any questions, problems, or concerns.  It was a pleasure to see you again today!  Websites that have reliable patient information: 1. American Academy of Asthma, Allergy, and Immunology: www.aaaai.org 2. Food Allergy Research and Education (FARE):  foodallergy.org 3. Mothers of Asthmatics: http://www.asthmacommunitynetwork.org 4. American College of Allergy, Asthma, and Immunology: www.acaai.org

## 2017-07-01 DIAGNOSIS — Z0001 Encounter for general adult medical examination with abnormal findings: Secondary | ICD-10-CM | POA: Diagnosis not present

## 2017-07-06 ENCOUNTER — Ambulatory Visit (INDEPENDENT_AMBULATORY_CARE_PROVIDER_SITE_OTHER): Payer: BLUE CROSS/BLUE SHIELD | Admitting: *Deleted

## 2017-07-06 DIAGNOSIS — J4550 Severe persistent asthma, uncomplicated: Secondary | ICD-10-CM | POA: Diagnosis not present

## 2017-07-06 DIAGNOSIS — J4540 Moderate persistent asthma, uncomplicated: Secondary | ICD-10-CM

## 2017-07-07 ENCOUNTER — Telehealth: Payer: Self-pay | Admitting: Allergy & Immunology

## 2017-07-07 MED ORDER — EPINEPHRINE 0.3 MG/0.3ML IJ SOAJ: 0.3000 mg | Freq: Once | INTRAMUSCULAR | 1 refills | Status: AC

## 2017-07-07 NOTE — Telephone Encounter (Signed)
Pt called and and need to have the epi-pen called in and wants know if we have coupons. Also want to know about the cat ??? [phone] or [phone].

## 2017-07-07 NOTE — Telephone Encounter (Signed)
Called and spoke with patient.  Patient can't find her copy of the skin test results that we gave patient.  Reviewed with patient the skin test results.  Informed patient that her insurance will not cover Epi-pen and Auvi-Q will be sent in to [facility].  Patient was given instructions regarding Auvi-Q and  ASPN and will be expecting call for delivery.  Patient voiced understanding.

## 2017-07-12 MED ORDER — MEPOLIZUMAB 100 MG ~~LOC~~ SOLR
100.0000 mg | SUBCUTANEOUS | Status: DC
  Administered 2017-08-03 – 2020-04-17 (×36): 100 mg via SUBCUTANEOUS

## 2017-07-12 NOTE — Progress Notes (Signed)
Immunotherapy   Patient Details  Name: Alicia Hall MRN: [id number] Date of Birth: May 04, 1955  07/06/2017  Patient came to [place] office to start Nucala injections.  Patient received a sample of Nucala today while she is waiting on her co-pay card for Nucala.  Lot Number [id number] and Expiration Date of 11/24/2018.  Patient will receive Nucala 100 mg every 28 days.   RX for Auvi-Q was sent to [facility].   Consent signed and patient instructions given.  Patient waited 60 minutes after injection and no issues.   Maree Erie 07/12/2017, 4:40 PM

## 2017-08-02 DIAGNOSIS — J4550 Severe persistent asthma, uncomplicated: Secondary | ICD-10-CM | POA: Diagnosis not present

## 2017-08-03 ENCOUNTER — Ambulatory Visit (INDEPENDENT_AMBULATORY_CARE_PROVIDER_SITE_OTHER): Payer: BLUE CROSS/BLUE SHIELD

## 2017-08-03 DIAGNOSIS — J4550 Severe persistent asthma, uncomplicated: Secondary | ICD-10-CM | POA: Diagnosis not present

## 2017-08-03 DIAGNOSIS — J4540 Moderate persistent asthma, uncomplicated: Secondary | ICD-10-CM

## 2017-08-19 DIAGNOSIS — L559 Sunburn, unspecified: Secondary | ICD-10-CM | POA: Diagnosis not present

## 2017-08-19 DIAGNOSIS — L03116 Cellulitis of left lower limb: Secondary | ICD-10-CM | POA: Diagnosis not present

## 2017-08-19 DIAGNOSIS — Z6841 Body Mass Index (BMI) 40.0 and over, adult: Secondary | ICD-10-CM | POA: Diagnosis not present

## 2017-08-30 DIAGNOSIS — J4550 Severe persistent asthma, uncomplicated: Secondary | ICD-10-CM | POA: Diagnosis not present

## 2017-08-31 ENCOUNTER — Ambulatory Visit (INDEPENDENT_AMBULATORY_CARE_PROVIDER_SITE_OTHER): Payer: BLUE CROSS/BLUE SHIELD

## 2017-08-31 DIAGNOSIS — J4540 Moderate persistent asthma, uncomplicated: Secondary | ICD-10-CM

## 2017-08-31 DIAGNOSIS — J4550 Severe persistent asthma, uncomplicated: Secondary | ICD-10-CM

## 2017-09-27 DIAGNOSIS — J4550 Severe persistent asthma, uncomplicated: Secondary | ICD-10-CM | POA: Diagnosis not present

## 2017-09-27 DIAGNOSIS — J302 Other seasonal allergic rhinitis: Secondary | ICD-10-CM | POA: Diagnosis not present

## 2017-09-27 DIAGNOSIS — J3089 Other allergic rhinitis: Secondary | ICD-10-CM | POA: Diagnosis not present

## 2017-09-27 DIAGNOSIS — R918 Other nonspecific abnormal finding of lung field: Secondary | ICD-10-CM | POA: Diagnosis not present

## 2017-09-27 DIAGNOSIS — J449 Chronic obstructive pulmonary disease, unspecified: Secondary | ICD-10-CM | POA: Diagnosis not present

## 2017-09-28 ENCOUNTER — Ambulatory Visit: Payer: BLUE CROSS/BLUE SHIELD | Admitting: Allergy & Immunology

## 2017-09-28 ENCOUNTER — Encounter: Payer: Self-pay | Admitting: Allergy & Immunology

## 2017-09-28 ENCOUNTER — Ambulatory Visit: Payer: Self-pay

## 2017-09-28 VITALS — BP 110/62 | HR 77 | Temp 97.8°F | Resp 18 | Ht 62.5 in | Wt 219.2 lb

## 2017-09-28 DIAGNOSIS — J302 Other seasonal allergic rhinitis: Secondary | ICD-10-CM | POA: Diagnosis not present

## 2017-09-28 DIAGNOSIS — J3089 Other allergic rhinitis: Secondary | ICD-10-CM

## 2017-09-28 DIAGNOSIS — J449 Chronic obstructive pulmonary disease, unspecified: Secondary | ICD-10-CM | POA: Diagnosis not present

## 2017-09-28 DIAGNOSIS — J4550 Severe persistent asthma, uncomplicated: Secondary | ICD-10-CM | POA: Diagnosis not present

## 2017-09-28 DIAGNOSIS — R918 Other nonspecific abnormal finding of lung field: Secondary | ICD-10-CM | POA: Diagnosis not present

## 2017-09-28 NOTE — Patient Instructions (Addendum)
1. Asthma-COPD overlap syndrome - Lung function looked much better today.  - I am so glad to hear that the [name] is working so well.  - Daily controller medication(s): Spiriva 2.55mcg one puff once daily + Singulair 10mg  daily + Symbicort 160/4.14mcg two puffs twice daily with spacer + Nucala monthly  - Prior to physical activity: ProAir 2 puffs 10-15 minutes before physical activity. - Rescue medications: ProAir 4 puffs every 4-6 hours as needed or albuterol nebulizer one vial every 4-6 hours as needed - Changes during respiratory infections or worsening symptoms: Add on Asmanex 123mcg to 2 puffs twice daily for ONE TO TWO WEEKS. - Asthma control goals:  * Full participation in all desired activities (may need albuterol before activity) * Albuterol use two time or less a week on average (not counting use with activity) * Cough interfering with sleep two time or less a month * Oral steroids no more than once a year * No hospitalizations  2. Seasonal and perennial allergic rhinitis - Continue with Dymista 1-2 sprays per nostril up to twice daily. - Continue with cetirizine 10mg  daily. - Continue with montelukast (Singulair) 10mg  daily.  3. Return in about 3 months (around 12/29/2017).   Please inform us of any Emergency Department visits, hospitalizations, or changes in symptoms. Call us before going to the ED for breathing or allergy symptoms since we might be able to fit you in for a sick visit. Feel free to contact us anytime with any questions, problems, or concerns.  It was a pleasure to see you again today!  Websites that have reliable patient information: 1. American Academy of Asthma, Allergy, and Immunology: www.aaaai.org 2. Food Allergy Research and Education (FARE): foodallergy.org 3. Mothers of Asthmatics: http://www.asthmacommunitynetwork.org 4. American College of Allergy, Asthma, and Immunology: www.acaai.org

## 2017-09-28 NOTE — Progress Notes (Signed)
FOLLOW UP  Date of Service/Encounter:  09/28/17   Assessment:   Asthma-COPD overlap syndrome - with eosinophilic phenotype on Nucala  Seasonal and perennial allergic rhinitis(grasses, weeds, trees, molds, dust mites, cat, horse, and cockroach)  Pulmonary nodules- followed by Dr. Chase Caller (last chest CT October 2018 stable)  History of non-compliance due to medication cost   Alicia Hall has done very well on the Nucala monthly as an additive medication to her asthma regimen. Her spiro showed values in the 30% range at the last visit, and today they are in the 70% range. Symptomatically, she is doing very well with the use of albuterol only a couple of times since starting the Nucala. Allergic rhinitis would benefit from allergen immunotherapy, but she continues to have problems with affording her medications and is awaiting initiation of Medicare.    Plan/Recommendations:   1. Asthma-COPD overlap syndrome - Lung function looked much better today.  - I am so glad to hear that the Alicia Hall is working so well.  - Daily controller medication(s): Spiriva 2.74mcg one puff once daily + Singulair 10mg  daily + Symbicort 160/4.82mcg two puffs twice daily with spacer + Nucala monthly  - Prior to physical activity: ProAir 2 puffs 10-15 minutes before physical activity. - Rescue medications: ProAir 4 puffs every 4-6 hours as needed or albuterol nebulizer one vial every 4-6 hours as needed - Changes during respiratory infections or worsening symptoms: Add on Asmanex 124mcg to 2 puffs twice daily for ONE TO TWO WEEKS. - Asthma control goals:  * Full participation in all desired activities (may need albuterol before activity) * Albuterol use two time or less a week on average (not counting use with activity) * Cough interfering with sleep two time or less a month * Oral steroids no more than once a year * No hospitalizations  2. Seasonal and perennial allergic rhinitis - Continue with  Dymista 1-2 sprays per nostril up to twice daily. - Continue with cetirizine 10mg  daily. - Continue with montelukast (Singulair) 10mg  daily.  3. Return in about 3 months (around 12/29/2017).  Subjective:   Alicia Hall is a 62 y.o. female presenting today for follow up of  Chief Complaint  Patient presents with  . Follow-up    Alicia Hall has a history of the following: Patient Active Problem List   Diagnosis Date Noted  . Pulmonary nodules 09/22/2016  . Asthma-COPD overlap syndrome ([place]) 07/28/2016  . COPD, moderate ([place]) 07/05/2015  . Hypersomnia with sleep apnea 06/19/2015  . OSA (obstructive sleep apnea) 06/19/2015  . Depression 06/19/2015  . Insomnia 06/19/2015  . Lung nodule 03/08/2015  . History of colonic polyps   . GERD (gastroesophageal reflux disease) 01/25/2015  . Dysphagia 01/25/2015  . Encounter for screening colonoscopy 01/25/2015  . COPD (chronic obstructive pulmonary disease) ([place]) 12/18/2014  . Moderate persistent asthma 11/23/2014  . Dyspnea and respiratory abnormality 11/23/2014  . Smoking history 11/23/2014  . Upper airway cough syndrome 11/23/2014  . Perennial allergic rhinitis 11/15/2014    History obtained from: chart review and patient.  Alicia Hall's Primary Care Provider is Alicia Bis, MD.     Alicia Hall is a 62 y.o. female presenting for a follow up visit.  She has a history of asthma with COPD overlap, whose care is been hampered by difficulty affording her medications.  At the last visit, we changed her to Symbicort since there is a $0 co-pay card.  We did give her a prednisone burst.  We  continued her on Spiriva 2.5 mcg 1 puff once daily as well as Singulair 10 mg daily.  For her allergic rhinitis, we continue Dymista 1 to 2 sprays per nostril twice daily, cetirizine 10 mg daily, and Singulair 10 mg daily.  We have discussed allergy shots on multiple occasions, at the cost has kept her from starting these.  In the interim, she has  started Nucala 100 mg monthly to help control her asthma symptoms.  Since the last visit, she has done well. She now got the Nucala for the third dose. She has not needed to use her nebulizer at all. Quality of life has markedly improved. However, over the past month she has had congestion which has worsened her symptoms temporarily.   Asthma/Respiratory Symptom History: She remains on her Symbicort as well as her Spiriva. She has tolerated her Nucala without a problem. Alicia Hall's asthma has been well controlled. She has not required rescue medication, experienced nocturnal awakenings due to lower respiratory symptoms, nor have activities of daily living been limited. She has required no Emergency Department or Urgent Care visits for her asthma. She has required zero courses of systemic steroids for asthma exacerbations since the last visit. ACT score today is 21, indicating excellent asthma symptom control.   Allergic Rhinitis Symptom History: She remains on her Dymista which is evidently well covered. She remains on the Singulair 10mg  daily. She has not needed antibiotics. She has had marked congestion over the past few weeks.   She got back from the beach recently [facility] Otherwise, there have been no changes to her past medical history, surgical history, family history, or social history.    Review of Systems: a 14-point review of systems is pertinent for what is mentioned in HPI.  Otherwise, all other systems were negative. Constitutional: negative other than that listed in the HPI Eyes: negative other than that listed in the HPI Ears, nose, mouth, throat, and face: negative other than that listed in the HPI Respiratory: negative other than that listed in the HPI Cardiovascular: negative other than that listed in the HPI Gastrointestinal: negative other than that listed in the HPI Genitourinary: negative other than that listed in the HPI Integument: negative other than that listed in  the HPI Hematologic: negative other than that listed in the HPI Musculoskeletal: negative other than that listed in the HPI Neurological: negative other than that listed in the HPI Allergy/Immunologic: negative other than that listed in the HPI    Objective:   Blood pressure 110/62, pulse 77, temperature 97.8 F (36.6 C), temperature source Oral, resp. rate 18, height 5' 2.5" (1.588 m), weight 219 lb 3.2 oz (99.4 kg), SpO2 96 %. Body mass index is 39.45 kg/m.   Physical Exam:  General: Alert, interactive, in no acute distress. Pleasant and talkative.  Eyes: No conjunctival injection bilaterally, no discharge on the right, no discharge on the left, no Horner-Trantas dots present and allergic shiners present bilaterally. PERRL bilaterally. EOMI without pain. No photophobia.  Ears: Right TM pearly gray with normal light reflex, Left TM pearly gray with normal light reflex, Right TM intact without perforation and Left TM intact without perforation.  Nose/Throat: External nose within normal limits and septum midline. Turbinates edematous and pale with clear discharge. Posterior oropharynx mildly erythematous without cobblestoning in the posterior oropharynx. Tonsils 2+ without exudates.  Tongue without thrush. Lungs: Clear to auscultation without wheezing, rhonchi or rales. No increased work of breathing. CV: Normal S1/S2. No murmurs. Capillary refill <2 seconds.  Skin: Warm and dry, without lesions or rashes. Neuro:   Grossly intact. No focal deficits appreciated. Responsive to questions.  Diagnostic studies:   Spirometry: results abnormal (FEV1: 1.49/71%, FVC: 1.98/69%, FEV1/FVC: 75%).    Spirometry consistent with possible restrictive disease, but overall values are markedly improved compared to the last visit.   Allergy Studies: none     Salvatore Marvel, MD  [facility]

## 2017-10-04 ENCOUNTER — Other Ambulatory Visit: Payer: Self-pay | Admitting: *Deleted

## 2017-10-04 MED ORDER — AMOXICILLIN-POT CLAVULANATE 875-125 MG PO TABS: 1.0000 | ORAL_TABLET | Freq: Two times a day (BID) | ORAL | 0 refills | Status: AC

## 2017-10-04 NOTE — Telephone Encounter (Signed)
Patient called requesting a sample of Spiriva 2.60mcg and Dymista. We are out of Dymista but a sample of Spiriva will be available to her tomorrow 10/05/17 at the [place] office. She spoke with [name] briefly about not feeling well. A prescription for Augmentin has been sent in to the [facility] in [place], [place].

## 2017-10-12 DIAGNOSIS — L309 Dermatitis, unspecified: Secondary | ICD-10-CM | POA: Diagnosis not present

## 2017-10-12 DIAGNOSIS — Z6841 Body Mass Index (BMI) 40.0 and over, adult: Secondary | ICD-10-CM | POA: Diagnosis not present

## 2017-10-26 ENCOUNTER — Ambulatory Visit (INDEPENDENT_AMBULATORY_CARE_PROVIDER_SITE_OTHER): Payer: BLUE CROSS/BLUE SHIELD

## 2017-10-26 DIAGNOSIS — J4540 Moderate persistent asthma, uncomplicated: Secondary | ICD-10-CM

## 2017-10-26 DIAGNOSIS — J4550 Severe persistent asthma, uncomplicated: Secondary | ICD-10-CM

## 2017-10-27 DIAGNOSIS — J4550 Severe persistent asthma, uncomplicated: Secondary | ICD-10-CM | POA: Diagnosis not present

## 2017-11-22 ENCOUNTER — Telehealth: Payer: Self-pay | Admitting: Allergy & Immunology

## 2017-11-22 DIAGNOSIS — J4550 Severe persistent asthma, uncomplicated: Secondary | ICD-10-CM | POA: Diagnosis not present

## 2017-11-22 NOTE — Telephone Encounter (Signed)
Pt called and needs samples Dymista, Saline Spray if we have any. Will pick up Tuesday in [place].

## 2017-11-22 NOTE — Telephone Encounter (Signed)
Called patient and notified that a sample of Dymista and NeilMed [place] will be taken Alicia Hall tomorrow for patient to pick up.  Patient voiced understanding.

## 2017-11-23 ENCOUNTER — Ambulatory Visit (INDEPENDENT_AMBULATORY_CARE_PROVIDER_SITE_OTHER): Payer: BLUE CROSS/BLUE SHIELD | Admitting: *Deleted

## 2017-11-23 DIAGNOSIS — J4540 Moderate persistent asthma, uncomplicated: Secondary | ICD-10-CM

## 2017-11-23 DIAGNOSIS — J4550 Severe persistent asthma, uncomplicated: Secondary | ICD-10-CM | POA: Diagnosis not present

## 2017-11-24 ENCOUNTER — Telehealth: Payer: Self-pay | Admitting: Internal Medicine

## 2017-11-24 NOTE — Telephone Encounter (Signed)
Pt returned call.  Stated to her the reason for why ct was to be repeated. Pt expressed understanding and has been scheduled for CT 10/24 followed by OV with MR 10/29.  Pt stated if she decided she wanted to cancel the CT she would let us know as I stated to her if she cancelled the CT, to call our office to cancel her upcoming OV with MR as her appt is a 1 year follow up after the CT.  Pt expressed understanding. Nothing further needed.

## 2017-11-24 NOTE — Telephone Encounter (Signed)
Plan from last OV with MR 12/21/16: Lung nodule - stable x 1  Year  - do repeat ct chest wo contrast in  1 year  Followup 12 months but after CT chest - sooner if needed  Attempted to call pt to let her know the above is why we are scheduling the CT of her chest but unable to reach pt.  Left message for pt to return call x1.

## 2017-12-16 ENCOUNTER — Ambulatory Visit (HOSPITAL_COMMUNITY): Payer: BLUE CROSS/BLUE SHIELD

## 2017-12-21 ENCOUNTER — Ambulatory Visit: Payer: BLUE CROSS/BLUE SHIELD | Admitting: Internal Medicine

## 2017-12-21 DIAGNOSIS — J4550 Severe persistent asthma, uncomplicated: Secondary | ICD-10-CM | POA: Diagnosis not present

## 2017-12-22 ENCOUNTER — Ambulatory Visit (INDEPENDENT_AMBULATORY_CARE_PROVIDER_SITE_OTHER): Payer: BLUE CROSS/BLUE SHIELD | Admitting: *Deleted

## 2017-12-22 DIAGNOSIS — J4550 Severe persistent asthma, uncomplicated: Secondary | ICD-10-CM

## 2017-12-22 DIAGNOSIS — J4540 Moderate persistent asthma, uncomplicated: Secondary | ICD-10-CM

## 2017-12-27 ENCOUNTER — Telehealth: Payer: Self-pay | Admitting: Internal Medicine

## 2017-12-27 NOTE — Telephone Encounter (Signed)
Pt received a letter from her pharmacy about the Zantac and she was given samples of Dexilant previously. She would like an RX for a PPI medication since the Zantac is on recall.

## 2017-12-27 NOTE — Telephone Encounter (Signed)
Pt has been taking Ranitidine 150 mg and it's been recalled. She wanted to know if she could get a Rx of Dexilant instead (if it isn't too expensive with her insurance). She uses [facility] in [place]. [phone] or [phone]

## 2017-12-28 ENCOUNTER — Ambulatory Visit: Payer: BLUE CROSS/BLUE SHIELD | Admitting: Allergy & Immunology

## 2017-12-28 ENCOUNTER — Telehealth: Payer: Self-pay | Admitting: Internal Medicine

## 2017-12-28 NOTE — Telephone Encounter (Signed)
Pt wants Dexilant samples. [phone]

## 2017-12-28 NOTE — Telephone Encounter (Signed)
Pts last ov was 2017. Please advise on samples.

## 2017-12-29 ENCOUNTER — Encounter: Payer: Self-pay | Admitting: Allergy & Immunology

## 2017-12-29 ENCOUNTER — Ambulatory Visit: Payer: BLUE CROSS/BLUE SHIELD | Admitting: Allergy & Immunology

## 2017-12-29 VITALS — BP 136/72 | HR 74 | Resp 20

## 2017-12-29 DIAGNOSIS — R918 Other nonspecific abnormal finding of lung field: Secondary | ICD-10-CM

## 2017-12-29 DIAGNOSIS — J3089 Other allergic rhinitis: Secondary | ICD-10-CM | POA: Diagnosis not present

## 2017-12-29 DIAGNOSIS — J449 Chronic obstructive pulmonary disease, unspecified: Secondary | ICD-10-CM | POA: Diagnosis not present

## 2017-12-29 NOTE — Patient Instructions (Addendum)
1. Asthma-COPD overlap syndrome - Lung function looked much better today.  - I am so glad to hear that the Alicia Hall is working so well.  - Since you are doing so well, we will stop the Spiriva since this is likely your most expensive out of pocket medication.  - Daily controller medication(s): Singulair 10mg  daily + Symbicort 160/4.62mcg two puffs twice daily with spacer + Nucala monthly  - Prior to physical activity: ProAir 2 puffs 10-15 minutes before physical activity. - Rescue medications: ProAir 4 puffs every 4-6 hours as needed or albuterol nebulizer one vial every 4-6 hours as needed - Changes during respiratory infections or worsening symptoms: Add on Asmanex 141mcg to 2 puffs twice daily for ONE TO TWO WEEKS. - Asthma control goals:  * Full participation in all desired activities (may need albuterol before activity) * Albuterol use two time or less a week on average (not counting use with activity) * Cough interfering with sleep two time or less a month * Oral steroids no more than once a year * No hospitalizations  2. Seasonal and perennial allergic rhinitis - Continue with Dymista 1-2 sprays per nostril up to twice daily. - Continue with cetirizine 10mg  daily. - Continue with montelukast (Singulair) 10mg  daily.  3. Return in about 3 months (around 03/31/2018).   Please inform us of any Emergency Department visits, hospitalizations, or changes in symptoms. Call us before going to the ED for breathing or allergy symptoms since we might be able to fit you in for a sick visit. Feel free to contact us anytime with any questions, problems, or concerns.  It was a pleasure to see you again today!  Websites that have reliable patient information: 1. American Academy of Asthma, Allergy, and Immunology: www.aaaai.org 2. Food Allergy Research and Education (FARE): foodallergy.org 3. Mothers of Asthmatics: http://www.asthmacommunitynetwork.org 4. American College of Allergy, Asthma, and  Immunology: www.acaai.org

## 2017-12-29 NOTE — Progress Notes (Signed)
FOLLOW UP  Date of Service/Encounter:  12/29/17   Assessment:    Asthma-COPD overlap syndrome- with eosinophilic phenotype on Nucala  Seasonal and perennial allergic rhinitis(grasses, weeds, trees, molds, dust mites, cat, horse, and cockroach)  Pulmonary nodules- followed by Dr. Chase Caller (last chest CT October 2018 stable)  History of non-compliance due to medication cost   Ms. Deveney is doing remarkably well on the monthly mepolizumab.  In fact, we are going to stepdown her therapy by getting rid of the Spiriva.  This is probably the most expensive medication for her.  I did discuss that if she develops increased shortness of breath, wheezing, or coughing, she should give [place] a call and we might have to restart the Spiriva.  I have been very impressed with how mepolizumab has helped her out and she is very happy with energy and increased stamina she has.     Plan/Recommendations:   21. Asthma-COPD overlap syndrome - Lung function looked much better today.  - I am so glad to hear that the [name] is working so well.  - Since you are doing so well, we will stop the Spiriva since this is likely your most expensive out of pocket medication.  - Daily controller medication(s): Singulair 10mg  daily + Symbicort 160/4.102mcg two puffs twice daily with spacer + Nucala monthly  - Prior to physical activity: ProAir 2 puffs 10-15 minutes before physical activity. - Rescue medications: ProAir 4 puffs every 4-6 hours as needed or albuterol nebulizer one vial every 4-6 hours as needed - Changes during respiratory infections or worsening symptoms: Add on Asmanex 129mcg to 2 puffs twice daily for ONE TO TWO WEEKS. - Asthma control goals:  * Full participation in all desired activities (may need albuterol before activity) * Albuterol use two time or less a week on average (not counting use with activity) * Cough interfering with sleep two time or less a month * Oral steroids no more than  once a year * No hospitalizations  2. Seasonal and perennial allergic rhinitis - Continue with Dymista 1-2 sprays per nostril up to twice daily. - Continue with cetirizine 10mg  daily. - Continue with montelukast (Singulair) 10mg  daily.  3. Return in about 3 months (around 03/31/2018).  Subjective:   Alicia Hall is a 62 y.o. female presenting today for follow up of No chief complaint on file.   Nicanor Alcon Mariner has a history of the following: Patient Active Problem List   Diagnosis Date Noted  . Pulmonary nodules 09/22/2016  . Asthma-COPD overlap syndrome ([place]) 07/28/2016  . COPD, moderate ([place]) 07/05/2015  . Hypersomnia with sleep apnea 06/19/2015  . OSA (obstructive sleep apnea) 06/19/2015  . Depression 06/19/2015  . Insomnia 06/19/2015  . Lung nodule 03/08/2015  . History of colonic polyps   . GERD (gastroesophageal reflux disease) 01/25/2015  . Dysphagia 01/25/2015  . Encounter for screening colonoscopy 01/25/2015  . COPD (chronic obstructive pulmonary disease) ([place]) 12/18/2014  . Moderate persistent asthma 11/23/2014  . Dyspnea and respiratory abnormality 11/23/2014  . Smoking history 11/23/2014  . Upper airway cough syndrome 11/23/2014  . Perennial allergic rhinitis 11/15/2014    History obtained from: chart review and patient.  Nicanor Alcon Goodie's Primary Care Provider is Caryl Bis, MD.     Shaquille is a 62 y.o. female presenting for a follow up visit.  She was last seen in August 2019.  At that time, her lung function looked excellent.  We continued her on Spiriva 2.5 mcg 1  puff daily, Singulair 10 mg daily, Symbicort 160/4.5 mcg 2 puffs twice daily, and mepolizumab monthly.  For her rhinitis, we continue Dymista 1 to 2 sprays per nostril up to twice daily as well as cetirizine and Singulair.  Since the last visit, she reports that she has done well.   Asthma/Respiratory Symptom History: She remains on the Symbicort, Spiriva, and montelukast. She is on the  Nucala and is really doing quite well on this. She feels that her lung function has markedly improved since starting the Nucala and she has never felt better. She does not remember the last time that she needed her albuterol nebulizer.   Allergic Rhinitis Symptom History: She is doing well with the Dymista, which she tries to take on a daily basis. She has not needed any antibiotics since the last visit. She remains on the Singulair.   Otherwise, there have been no changes to her past medical history, surgical history, family history, or social history.    Review of Systems: a 14-point review of systems is pertinent for what is mentioned in HPI.  Otherwise, all other systems were negative.  Constitutional: negative other than that listed in the HPI Eyes: negative other than that listed in the HPI Ears, nose, mouth, throat, and face: negative other than that listed in the HPI Respiratory: negative other than that listed in the HPI Cardiovascular: negative other than that listed in the HPI Gastrointestinal: negative other than that listed in the HPI Genitourinary: negative other than that listed in the HPI Integument: negative other than that listed in the HPI Hematologic: negative other than that listed in the HPI Musculoskeletal: negative other than that listed in the HPI Neurological: negative other than that listed in the HPI Allergy/Immunologic: negative other than that listed in the HPI    Objective:   Blood pressure 136/72, pulse 74, resp. rate 20, SpO2 98 %. There is no height or weight on file to calculate BMI.   Physical Exam:  General: Alert, interactive, in no acute distress. Obese female. Talkative.  Eyes: No conjunctival injection bilaterally, no discharge on the right, no discharge on the left, no Horner-Trantas dots present and allergic shiners present bilaterally. PERRL bilaterally. EOMI without pain. No photophobia.  Ears: Right TM pearly gray with normal light  reflex, Left TM pearly gray with normal light reflex, Right TM intact without perforation and Left TM intact without perforation.  Nose/Throat: External nose within normal limits and septum midline. Turbinates edematous with clear discharge. Posterior oropharynx erythematous without cobblestoning in the posterior oropharynx. Tonsils 2+ without exudates.  Tongue without thrush. Lungs: Clear to auscultation without wheezing, rhonchi or rales. No increased work of breathing. CV: Normal S1/S2. No murmurs. Capillary refill <2 seconds.  Skin: Warm and dry, without lesions or rashes. Neuro:   Grossly intact. No focal deficits appreciated. Responsive to questions.  Diagnostic studies:   Spirometry: results abnormal (FEV1: 1.41/68%, FVC: 1.76/62%, FEV1/FVC: 80%).    Spirometry consistent with possible restrictive disease. Overall her results are stable compared to those obtained at the last visit.   Allergy Studies: none      Salvatore Marvel, MD  [facility] [place] [facility]

## 2017-12-29 NOTE — Telephone Encounter (Signed)
Patient calling to check on samples. Reports she will be this way around 12pm and wants to pick up then.

## 2017-12-29 NOTE — Telephone Encounter (Signed)
Noted. Waiting on a response from provider. Pt hasn't been seen since 2017.

## 2017-12-31 NOTE — Telephone Encounter (Signed)
See other note. We previously gave her Protonix. She never called with a progress report. We have not seen her in almost 3 years and will need OV to assess symptoms and select an appropriate PPI.

## 2017-12-31 NOTE — Telephone Encounter (Signed)
We will need to see her in office. We previously gave her a couple samples of Dexilant. PA for Dexilant was denied, se sent in protonix to try (per Insurance requirements) and she never call back (other than to ask for Dexilant samples again in 2017 and then again now). We cannot just keep supplying with samples and need to find an appropriate alternative or trial/fail insurance required alternatives so that PA can be approved.

## 2018-01-03 ENCOUNTER — Encounter: Payer: Self-pay | Admitting: Internal Medicine

## 2018-01-03 NOTE — Telephone Encounter (Signed)
Noted. Pt is aware that she needs an appointment. Pt needs an appointment before noon. Please schedule and mail pt appointment date and time.

## 2018-01-03 NOTE — Telephone Encounter (Signed)
PATIENT SCHEDULED AND LETTER SENT  °

## 2018-01-14 DIAGNOSIS — Z23 Encounter for immunization: Secondary | ICD-10-CM | POA: Diagnosis not present

## 2018-01-18 DIAGNOSIS — J4540 Moderate persistent asthma, uncomplicated: Secondary | ICD-10-CM | POA: Diagnosis not present

## 2018-01-19 ENCOUNTER — Ambulatory Visit (INDEPENDENT_AMBULATORY_CARE_PROVIDER_SITE_OTHER): Payer: BLUE CROSS/BLUE SHIELD

## 2018-01-19 DIAGNOSIS — J4540 Moderate persistent asthma, uncomplicated: Secondary | ICD-10-CM | POA: Diagnosis not present

## 2018-01-19 DIAGNOSIS — J4550 Severe persistent asthma, uncomplicated: Secondary | ICD-10-CM

## 2018-01-26 DIAGNOSIS — Z6839 Body mass index (BMI) 39.0-39.9, adult: Secondary | ICD-10-CM | POA: Diagnosis not present

## 2018-01-26 DIAGNOSIS — J329 Chronic sinusitis, unspecified: Secondary | ICD-10-CM | POA: Diagnosis not present

## 2018-02-18 ENCOUNTER — Ambulatory Visit: Payer: Self-pay

## 2018-02-21 DIAGNOSIS — G4733 Obstructive sleep apnea (adult) (pediatric): Secondary | ICD-10-CM | POA: Diagnosis not present

## 2018-02-24 DIAGNOSIS — J4550 Severe persistent asthma, uncomplicated: Secondary | ICD-10-CM | POA: Diagnosis not present

## 2018-02-25 ENCOUNTER — Ambulatory Visit (INDEPENDENT_AMBULATORY_CARE_PROVIDER_SITE_OTHER): Payer: BLUE CROSS/BLUE SHIELD

## 2018-02-25 DIAGNOSIS — J4550 Severe persistent asthma, uncomplicated: Secondary | ICD-10-CM | POA: Diagnosis not present

## 2018-03-14 DIAGNOSIS — K210 Gastro-esophageal reflux disease with esophagitis: Secondary | ICD-10-CM | POA: Diagnosis not present

## 2018-03-14 DIAGNOSIS — I10 Essential (primary) hypertension: Secondary | ICD-10-CM | POA: Diagnosis not present

## 2018-03-14 DIAGNOSIS — J441 Chronic obstructive pulmonary disease with (acute) exacerbation: Secondary | ICD-10-CM | POA: Diagnosis not present

## 2018-03-14 DIAGNOSIS — E782 Mixed hyperlipidemia: Secondary | ICD-10-CM | POA: Diagnosis not present

## 2018-03-19 DIAGNOSIS — Z23 Encounter for immunization: Secondary | ICD-10-CM | POA: Diagnosis not present

## 2018-03-19 DIAGNOSIS — G4733 Obstructive sleep apnea (adult) (pediatric): Secondary | ICD-10-CM | POA: Diagnosis not present

## 2018-03-19 DIAGNOSIS — E6609 Other obesity due to excess calories: Secondary | ICD-10-CM | POA: Diagnosis not present

## 2018-03-19 DIAGNOSIS — I10 Essential (primary) hypertension: Secondary | ICD-10-CM | POA: Diagnosis not present

## 2018-03-19 DIAGNOSIS — E782 Mixed hyperlipidemia: Secondary | ICD-10-CM | POA: Diagnosis not present

## 2018-03-19 DIAGNOSIS — F331 Major depressive disorder, recurrent, moderate: Secondary | ICD-10-CM | POA: Diagnosis not present

## 2018-03-19 DIAGNOSIS — Z0001 Encounter for general adult medical examination with abnormal findings: Secondary | ICD-10-CM | POA: Diagnosis not present

## 2018-03-24 DIAGNOSIS — J4550 Severe persistent asthma, uncomplicated: Secondary | ICD-10-CM | POA: Diagnosis not present

## 2018-03-25 ENCOUNTER — Ambulatory Visit (INDEPENDENT_AMBULATORY_CARE_PROVIDER_SITE_OTHER): Payer: BLUE CROSS/BLUE SHIELD

## 2018-03-25 DIAGNOSIS — J4550 Severe persistent asthma, uncomplicated: Secondary | ICD-10-CM | POA: Diagnosis not present

## 2018-04-01 ENCOUNTER — Encounter: Payer: Self-pay | Admitting: Allergy & Immunology

## 2018-04-01 ENCOUNTER — Ambulatory Visit: Payer: BLUE CROSS/BLUE SHIELD | Admitting: Allergy & Immunology

## 2018-04-01 VITALS — BP 170/90 | HR 78 | Resp 18

## 2018-04-01 DIAGNOSIS — J302 Other seasonal allergic rhinitis: Secondary | ICD-10-CM | POA: Diagnosis not present

## 2018-04-01 DIAGNOSIS — J4540 Moderate persistent asthma, uncomplicated: Secondary | ICD-10-CM | POA: Diagnosis not present

## 2018-04-01 DIAGNOSIS — H1010 Acute atopic conjunctivitis, unspecified eye: Secondary | ICD-10-CM | POA: Diagnosis not present

## 2018-04-01 DIAGNOSIS — R918 Other nonspecific abnormal finding of lung field: Secondary | ICD-10-CM

## 2018-04-01 DIAGNOSIS — J3089 Other allergic rhinitis: Secondary | ICD-10-CM

## 2018-04-01 DIAGNOSIS — Z1231 Encounter for screening mammogram for malignant neoplasm of breast: Secondary | ICD-10-CM | POA: Diagnosis not present

## 2018-04-01 NOTE — Progress Notes (Signed)
FOLLOW UP  Date of Service/Encounter:  04/01/18   Assessment:   Asthma-COPD overlap syndrome- with eosinophilic phenotypeon Nucala  Seasonal and perennial allergic rhinitis(grasses, weeds, trees, molds, dust mites, cat, horse, and cockroach)  Pulmonary nodules- followed by Dr. Chase Caller (last chest CT October 2018 stable)  History of non-compliance due to medication cost   Alicia Hall is doing quite well on her Nucala. This has done a remarkable job of controlling her symptoms. We are not going to make any medication changes at this time, but we can certainly consider changing to an ICS instead of the Symbicort at the next visit if she continues to do well. We could at the very least decrease in Symbicort dose to the 80/4.5 dose.   Plan/Recommendations:   1. Asthma-COPD overlap syndrome - Lung function looked great today. - We will not make any medication changes at this time.  - Daily controller medication(s): Singulair 10mg  daily + Symbicort 160/4.53mcg two puffs twice daily with spacer + Nucala monthly  - Prior to physical activity: ProAir 2 puffs 10-15 minutes before physical activity. - Rescue medications: ProAir 4 puffs every 4-6 hours as needed or albuterol nebulizer one vial every 4-6 hours as needed - Changes during respiratory infections or worsening symptoms: Add on Asmanex 168mcg to 2 puffs twice daily for ONE TO TWO WEEKS. - Asthma control goals:  * Full participation in all desired activities (may need albuterol before activity) * Albuterol use two time or less a week on average (not counting use with activity) * Cough interfering with sleep two time or less a month * Oral steroids no more than once a year * No hospitalizations  2. Seasonal and perennial allergic rhinitis - Continue with Dymista 1-2 sprays per nostril up to twice daily as needed.  - Continue with cetirizine 10mg  daily. - Continue with montelukast (Singulair) 10mg  daily.   3. Return in  about 4 months (around 07/31/2018).   Subjective:   Alicia Hall is a 63 y.o. female presenting today for follow up of  Chief Complaint  Patient presents with  . Asthma    doing much better. just here to say Hi and bye, per [place].     Alicia Hall has a history of the following: Patient Active Problem List   Diagnosis Date Noted  . Pulmonary nodules 09/22/2016  . Asthma-COPD overlap syndrome ([place]) 07/28/2016  . COPD, moderate ([place]) 07/05/2015  . Hypersomnia with sleep apnea 06/19/2015  . OSA (obstructive sleep apnea) 06/19/2015  . Depression 06/19/2015  . Insomnia 06/19/2015  . Lung nodule 03/08/2015  . History of colonic polyps   . GERD (gastroesophageal reflux disease) 01/25/2015  . Dysphagia 01/25/2015  . Encounter for screening colonoscopy 01/25/2015  . COPD (chronic obstructive pulmonary disease) ([place]) 12/18/2014  . Moderate persistent asthma 11/23/2014  . Dyspnea and respiratory abnormality 11/23/2014  . Smoking history 11/23/2014  . Upper airway cough syndrome 11/23/2014  . Perennial allergic rhinitis 11/15/2014    History obtained from: chart review and patient.  Alicia Hall's Primary Care Provider is Caryl Bis, MD.     Alicia Hall is a 63 y.o. female presenting for a follow up visit.  She was last seen in November 2019 for regular office visit.  At that time, she was doing quite well on the mepolizumab combined with her Symbicort and Singulair.  We did stop her Spiriva since this was the highest out-of-pocket cost for her.  We also continued with Dymista, cetirizine, and Singulair.  Since the last visit, she has done very well.  She is very talkative today as always but does share how happy she is on the mepolizumab.  She does not remember the last time she needed prednisone.  ACT score today is 20, indicating excellent asthma control.  She has not required any hospitalizations or ER visits.  Her rhinitis continues to be well controlled with the cetirizine  and the Dymista.  She uses the Dymista "like gold" since it is quite expensive.  However, she does note that it works.  She remains on the montelukast as well.  She has not required any antibiotics since last visit.    Her energy level has been excellent.  She shares multiple pictures of her grandchildren today and is much happier with how she is able to interact with them without being short of breath.  Her oldest granddaughter is currently in the midst of picking out a prom dress and Ms. Laurrie goes around the office getting boots on which 1 to purchase.  Otherwise, there have been no changes to her past medical history, surgical history, family history, or social history.    Review of Systems: a 14-point review of systems is pertinent for what is mentioned in HPI.  Otherwise, all other systems were negative.  Constitutional: negative other than that listed in the HPI Eyes: negative other than that listed in the HPI Ears, nose, mouth, throat, and face: negative other than that listed in the HPI Respiratory: negative other than that listed in the HPI Cardiovascular: negative other than that listed in the HPI Gastrointestinal: negative other than that listed in the HPI Genitourinary: negative other than that listed in the HPI Integument: negative other than that listed in the HPI Hematologic: negative other than that listed in the HPI Musculoskeletal: negative other than that listed in the HPI Neurological: negative other than that listed in the HPI Allergy/Immunologic: negative other than that listed in the HPI    Objective:   Blood pressure (!) 170/90, pulse 78, resp. rate 18, SpO2 98 %. There is no height or weight on file to calculate BMI.   Physical Exam:  General: Alert, interactive, in no acute distress. Talkative.  Eyes: No conjunctival injection bilaterally, no discharge on the right, no discharge on the left and no Horner-Trantas dots present. PERRL bilaterally. EOMI  without pain. No photophobia.  Ears: Right TM pearly gray with normal light reflex, Left TM pearly gray with normal light reflex, Right TM intact without perforation and Left TM intact without perforation.  Nose/Throat: External nose within normal limits and septum midline. Turbinates edematous and pale with clear discharge. Posterior oropharynx erythematous without cobblestoning in the posterior oropharynx. Tonsils 2+ without exudates.  Tongue without thrush. Lungs: Clear to auscultation without wheezing, rhonchi or rales. No increased work of breathing. CV: Normal S1/S2. No murmurs. Capillary refill <2 seconds.  Skin: Warm and dry, without lesions or rashes. Neuro:   Grossly intact. No focal deficits appreciated. Responsive to questions.  Diagnostic studies:   Spirometry: results normal (FEV1: 1.59/76%, FVC: 2.18/77%, FEV1/FVC: 72%).    Spirometry consistent with normal pattern.   Allergy Studies: none      Salvatore Marvel, MD  [facility] [place]

## 2018-04-01 NOTE — Patient Instructions (Addendum)
1. Asthma-COPD overlap syndrome - Lung function looked great today. - We will not make any medication changes at this time.  - Daily controller medication(s): Singulair 10mg  daily + Symbicort 160/4.73mcg two puffs twice daily with spacer + Nucala monthly  - Prior to physical activity: ProAir 2 puffs 10-15 minutes before physical activity. - Rescue medications: ProAir 4 puffs every 4-6 hours as needed or albuterol nebulizer one vial every 4-6 hours as needed - Changes during respiratory infections or worsening symptoms: Add on Asmanex 169mcg to 2 puffs twice daily for ONE TO TWO WEEKS. - Asthma control goals:  * Full participation in all desired activities (may need albuterol before activity) * Albuterol use two time or less a week on average (not counting use with activity) * Cough interfering with sleep two time or less a month * Oral steroids no more than once a year * No hospitalizations  2. Seasonal and perennial allergic rhinitis - Continue with Dymista 1-2 sprays per nostril up to twice daily as needed.  - Continue with cetirizine 10mg  daily. - Continue with montelukast (Singulair) 10mg  daily.   3. Return in about 4 months (around 07/31/2018).   Please inform us of any Emergency Department visits, hospitalizations, or changes in symptoms. Call us before going to the ED for breathing or allergy symptoms since we might be able to fit you in for a sick visit. Feel free to contact us anytime with any questions, problems, or concerns.  It was a pleasure to see you again today!  Websites that have reliable patient information: 1. American Academy of Asthma, Allergy, and Immunology: www.aaaai.org 2. Food Allergy Research and Education (FARE): foodallergy.org 3. Mothers of Asthmatics: http://www.asthmacommunitynetwork.org 4. American College of Allergy, Asthma, and Immunology: www.acaai.org

## 2018-04-04 ENCOUNTER — Ambulatory Visit: Payer: BLUE CROSS/BLUE SHIELD | Admitting: Nurse Practitioner

## 2018-04-04 ENCOUNTER — Encounter: Payer: Self-pay | Admitting: Nurse Practitioner

## 2018-04-04 ENCOUNTER — Encounter: Payer: Self-pay | Admitting: Allergy & Immunology

## 2018-04-04 VITALS — BP 162/68 | HR 70 | Temp 97.5°F | Ht 62.0 in | Wt 219.0 lb

## 2018-04-04 DIAGNOSIS — R1319 Other dysphagia: Secondary | ICD-10-CM

## 2018-04-04 DIAGNOSIS — R1310 Dysphagia, unspecified: Secondary | ICD-10-CM | POA: Diagnosis not present

## 2018-04-04 DIAGNOSIS — K219 Gastro-esophageal reflux disease without esophagitis: Secondary | ICD-10-CM

## 2018-04-04 NOTE — Progress Notes (Signed)
cc'd to pcp 

## 2018-04-04 NOTE — Assessment & Plan Note (Signed)
Overall her GERD symptoms are doing well.  Her insurance would not pay for Dexilant.  She was changed to Zantac by primary care.  However, this is recalled and she was switched to Pepcid.  Pepcid is currently on back order.  I recommended that she pick up a supply of over-the-counter Pepcid versus Zantac (manufacturers that have not been recalled) if she has worsening symptoms before she is able to obtain her prescription.  Follow-up in 1 year.

## 2018-04-04 NOTE — Patient Instructions (Signed)
Your health issues we discussed today were:   Heartburn (GERD): 1. Continue your current medications 2. As we discussed, if you cannot obtain your prescription and you start having worsening symptoms you can get Pepcid or Zantac over-the-counter 3. Call with any worsening symptoms or any returning swallowing difficulties  Overall I recommend:  1. Follow-up in 1 year 2. Call us if you have any questions or concerns.  At [facility] we value your feedback. You may receive a survey about your visit today. Please share your experience as we strive to create trusting relationships with our patients to provide genuine, compassionate, quality care.  We appreciate your understanding and patience as we review any laboratory studies, imaging, and other diagnostic tests that are ordered as we care for you. Our office policy is 5 business days for review of these results, and any emergent or urgent results are addressed in a timely manner for your best interest. If you do not hear from our office in 1 week, please contact us.   We also encourage the use of MyChart, which contains your medical information for your review as well. If you are not enrolled in this feature, an access code is on this after visit summary for your convenience. Thank you for allowing [place] to be involved in your care.  It was great to see you today!  I hope you have a great day!!

## 2018-04-04 NOTE — Progress Notes (Signed)
Referring Provider: Caryl Bis, MD Primary Care Physician:  Caryl Bis, MD Primary GI:  Dr. Gala Romney  Chief Complaint  Patient presents with  . Gastroesophageal Reflux    f/u. Doing okay    HPI:   Alicia Hall is a 63 y.o. female who presents for follow-up and for medication refills.  The patient was last seen in our office 04/29/2015 for dysphasia and GERD.  Colonoscopy up-to-date 2016.  History of reflux esophagitis, small hiatal hernia on endoscopy.  History of multiple colon polyps that were hyperplastic on surgical pathology with a recommended 10-year repeat (2026).  Dexilant works well for her.  At her last visit Dexilant continue to work well, rare breakthrough.  Occasional nausea attributed to sinus drainage from allergies.  No other GI symptoms.  Recommended continue Dexilant, co-pay card was provided, follow-up in 1 year.  We received a denial of prior authorization from insurance for [place] indicating patient needed to try and failed least to formulary alternatives which are lansoprazole, esomeprazole, pantoprazole.  Protonix was sent into her pharmacy.  We never received progress report on her medications.  Today she states she's doing ok overall. GERD symptoms doing ok. She hasn't been on anything due to Zantac recall which was changed to Pepcid, which is on back order. No significant GERD breakthrough.  Denies any recurrent dysphagia symptoms.  Denies abdominal pain, N/V, hematochezia, melena, fever, chills, unintentional weight loss. Denies chest pain, dyspnea, dizziness, lightheadedness, syncope, near syncope. Denies any other upper or lower GI symptoms.  Past Medical History:  Diagnosis Date  . Asthma   . COPD (chronic obstructive pulmonary disease) ([place])   . GERD (gastroesophageal reflux disease)   . Hypertension   . OSA (obstructive sleep apnea)    on CPAP  . Seasonal allergies     Past Surgical History:  Procedure Laterality Date  . COLONOSCOPY N/A  01/31/2015   RMR: Multiple colonic polyps treated/removed as described above  . ESOPHAGOGASTRODUODENOSCOPY N/A 01/31/2015   RMR: mild changes of reflux esophagitis. Tiny hiatal hernia. Status post passage of a Maloney dilator.   Marland Kitchen MALONEY DILATION N/A 01/31/2015   Procedure: Venia Minks DILATION;  Surgeon: Daneil Dolin, MD;  Location: [facility];  Service: Endoscopy;  Laterality: N/A;    Current Outpatient Medications  Medication Sig Dispense Refill  . albuterol (PROAIR HFA) 108 (90 Base) MCG/ACT inhaler Inhale 2 puffs into the lungs every 4 (four) hours as needed for wheezing or shortness of breath. 1 Inhaler 1  . albuterol (PROVENTIL) (2.5 MG/3ML) 0.083% nebulizer solution Take 3 mLs (2.5 mg total) by nebulization every 6 (six) hours as needed for wheezing or shortness of breath. 75 mL 1  . amLODipine (NORVASC) 10 MG tablet Take 10 mg by mouth daily.    . cetirizine (ZYRTEC) 10 MG tablet Take 10 mg by mouth daily.    Marland Kitchen FAMOTIDINE PO Take by mouth. Currently on back order    . losartan-hydrochlorothiazide (HYZAAR) 100-12.5 MG tablet Take 1 tablet by mouth daily.    . montelukast (SINGULAIR) 10 MG tablet Take 10 mg by mouth daily.    . norethindrone (AYGESTIN) 5 MG tablet Take 2.5 mg by mouth daily.    . Omega-3 Fatty Acids (FISH OIL) 1000 MG CAPS Take 2 capsules by mouth daily.    . Red Yeast Rice 600 MG CAPS Take 2 capsules by mouth daily.    . sertraline (ZOLOFT) 100 MG tablet Take 100 mg by mouth daily.    Marland Kitchen  SYMBICORT 160-4.5 MCG/ACT inhaler Inhale 2 puffs into the lungs 2 (two) times daily.  6  . Tiotropium Bromide Monohydrate (SPIRIVA RESPIMAT) 2.5 MCG/ACT AERS Inhale 2 puffs into the lungs daily. 1 Inhaler 0   Current Facility-Administered Medications  Medication Dose Route Frequency Provider Last Rate Last Dose  . Mepolizumab SOLR 100 mg  100 mg Subcutaneous Q28 days Valentina Shaggy, MD   100 mg at 03/25/18 1478    Allergies as of 04/04/2018 - Review Complete 04/04/2018    Allergen Reaction Noted  . Levaquin [levofloxacin]    . Lisinopril Cough 11/15/2014    Family History  Problem Relation Age of Onset  . Emphysema Father   . Emphysema Mother   . Lung cancer Maternal Grandfather   . Heart attack Maternal Grandmother   . Colon cancer Cousin     Social History   Socioeconomic History  . Marital status: Married    Spouse name: Not on file  . Number of children: Not on file  . Years of education: Not on file  . Highest education level: Not on file  Occupational History  . Occupation: house wife  Social Needs  . Financial resource strain: Not on file  . Food insecurity:    Worry: Not on file    Inability: Not on file  . Transportation needs:    Medical: Not on file    Non-medical: Not on file  Tobacco Use  . Smoking status: Former Smoker    Packs/day: 1.50    Years: 25.00    Pack years: 37.50    Types: Cigarettes    Last attempt to quit: 02/24/1995    Years since quitting: 23.1  . Smokeless tobacco: Never Used  Substance and Sexual Activity  . Alcohol use: Yes    Alcohol/week: 0.0 standard drinks    Comment: rarely  . Drug use: No  . Sexual activity: Not on file  Lifestyle  . Physical activity:    Days per week: Not on file    Minutes per session: Not on file  . Stress: Not on file  Relationships  . Social connections:    Talks on phone: Not on file    Gets together: Not on file    Attends religious service: Not on file    Active member of club or organization: Not on file    Attends meetings of clubs or organizations: Not on file    Relationship status: Not on file  Other Topics Concern  . Not on file  Social History Narrative  . Not on file    Review of Systems: General: Negative for anorexia, weight loss, fever, chills, fatigue, weakness. ENT: Negative for hoarseness, difficulty swallowing. CV: Negative for chest pain, angina, palpitations, peripheral edema.  Respiratory: Negative for dyspnea at rest, cough, sputum,  wheezing.  GI: See history of present illness. Endo: Negative for unusual weight change.  Heme: Negative for bruising or bleeding. Allergy: Negative for rash or hives.   Physical Exam: BP (!) 162/68   Pulse 70   Temp (!) 97.5 F (36.4 C) (Oral)   Ht 5\' 2"  (1.575 m)   Wt 219 lb (99.3 kg)   BMI 40.06 kg/m  General:   Alert and oriented. Pleasant and cooperative. Well-nourished and well-developed.  Eyes:  Without icterus, sclera clear and conjunctiva pink.  Ears:  Normal auditory acuity. Cardiovascular:  S1, S2 present without murmurs appreciated. Extremities without clubbing or edema. Respiratory:  Clear to auscultation bilaterally. No wheezes, rales, or  rhonchi. No distress.  Gastrointestinal:  +BS, soft, non-tender and non-distended. No HSM noted. No guarding or rebound. No masses appreciated.  Rectal:  Deferred  Musculoskalatal:  Symmetrical without gross deformities. Skin:  Intact without significant lesions or rashes. Psych:  Alert and cooperative. Normal mood and affect. Heme/Lymph/Immune: No excessive bruising noted.    04/04/2018 9:00 AM   Disclaimer: This note was dictated with voice recognition software. Similar sounding words can inadvertently be transcribed and may not be corrected upon review.

## 2018-04-04 NOTE — Assessment & Plan Note (Signed)
Previous history of dysphagia with EGD and dilation.  She has not had any worsening symptoms.  She was recommended to be on a PPI but her primary care changed her to an H2 receptor blocker.  Recommend she continue her current medications given that she is asymptomatic at this time.  Follow-up in 1 year.

## 2018-04-21 DIAGNOSIS — J4550 Severe persistent asthma, uncomplicated: Secondary | ICD-10-CM | POA: Diagnosis not present

## 2018-04-22 ENCOUNTER — Ambulatory Visit (INDEPENDENT_AMBULATORY_CARE_PROVIDER_SITE_OTHER): Payer: BLUE CROSS/BLUE SHIELD

## 2018-04-22 DIAGNOSIS — J4550 Severe persistent asthma, uncomplicated: Secondary | ICD-10-CM

## 2018-05-17 DIAGNOSIS — J4550 Severe persistent asthma, uncomplicated: Secondary | ICD-10-CM | POA: Diagnosis not present

## 2018-05-20 ENCOUNTER — Other Ambulatory Visit: Payer: Self-pay

## 2018-05-20 ENCOUNTER — Ambulatory Visit (INDEPENDENT_AMBULATORY_CARE_PROVIDER_SITE_OTHER): Payer: BLUE CROSS/BLUE SHIELD

## 2018-05-20 DIAGNOSIS — J4550 Severe persistent asthma, uncomplicated: Secondary | ICD-10-CM

## 2018-06-16 DIAGNOSIS — J4550 Severe persistent asthma, uncomplicated: Secondary | ICD-10-CM | POA: Diagnosis not present

## 2018-06-17 ENCOUNTER — Other Ambulatory Visit: Payer: Self-pay

## 2018-06-17 ENCOUNTER — Ambulatory Visit (INDEPENDENT_AMBULATORY_CARE_PROVIDER_SITE_OTHER): Payer: BLUE CROSS/BLUE SHIELD

## 2018-06-17 DIAGNOSIS — J4550 Severe persistent asthma, uncomplicated: Secondary | ICD-10-CM | POA: Diagnosis not present

## 2018-07-05 ENCOUNTER — Telehealth: Payer: Self-pay

## 2018-07-05 MED ORDER — EPINEPHRINE 0.3 MG/0.3ML IJ SOAJ: 0.3000 mg | INTRAMUSCULAR | 1 refills | Status: DC | PRN

## 2018-07-05 NOTE — Telephone Encounter (Signed)
Patient is calling to get a refill on her Alicia Hall, it is currently expiring and they sent her a letter to contact our office for a new refill.   Please Advise.

## 2018-07-05 NOTE — Telephone Encounter (Signed)
Called and informed patient that RX has been sent in.

## 2018-07-14 DIAGNOSIS — J4550 Severe persistent asthma, uncomplicated: Secondary | ICD-10-CM | POA: Diagnosis not present

## 2018-07-15 ENCOUNTER — Other Ambulatory Visit: Payer: Self-pay

## 2018-07-15 ENCOUNTER — Ambulatory Visit (INDEPENDENT_AMBULATORY_CARE_PROVIDER_SITE_OTHER): Payer: BLUE CROSS/BLUE SHIELD

## 2018-07-15 DIAGNOSIS — J4550 Severe persistent asthma, uncomplicated: Secondary | ICD-10-CM | POA: Diagnosis not present

## 2018-08-03 ENCOUNTER — Ambulatory Visit: Payer: BLUE CROSS/BLUE SHIELD | Admitting: Allergy & Immunology

## 2018-08-11 DIAGNOSIS — J4550 Severe persistent asthma, uncomplicated: Secondary | ICD-10-CM | POA: Diagnosis not present

## 2018-08-11 DIAGNOSIS — R918 Other nonspecific abnormal finding of lung field: Secondary | ICD-10-CM | POA: Diagnosis not present

## 2018-08-11 DIAGNOSIS — J3089 Other allergic rhinitis: Secondary | ICD-10-CM | POA: Diagnosis not present

## 2018-08-12 ENCOUNTER — Other Ambulatory Visit: Payer: Self-pay

## 2018-08-12 ENCOUNTER — Ambulatory Visit: Payer: Self-pay

## 2018-08-12 ENCOUNTER — Ambulatory Visit: Payer: BC Managed Care – PPO | Admitting: Allergy & Immunology

## 2018-08-12 VITALS — BP 120/74 | HR 70 | Temp 98.9°F | Resp 18

## 2018-08-12 DIAGNOSIS — J4550 Severe persistent asthma, uncomplicated: Secondary | ICD-10-CM | POA: Diagnosis not present

## 2018-08-12 DIAGNOSIS — J4540 Moderate persistent asthma, uncomplicated: Secondary | ICD-10-CM

## 2018-08-12 DIAGNOSIS — R918 Other nonspecific abnormal finding of lung field: Secondary | ICD-10-CM

## 2018-08-12 DIAGNOSIS — J3089 Other allergic rhinitis: Secondary | ICD-10-CM | POA: Diagnosis not present

## 2018-08-12 NOTE — Progress Notes (Signed)
FOLLOW UP  Date of Service/Encounter:  08/12/18   Assessment:   Asthma-COPD overlap syndrome- with eosinophilic phenotypeon Nucala  Seasonal and perennial allergic rhinitis(grasses, weeds, trees, molds, dust mites, cat, horse, and cockroach)  Pulmonary nodules- followed by Dr. Chase Caller (last chest CT October 2018 stable)  History of non-compliance due to medication cost (improved with current regimen)    Plan/Recommendations:   1. Asthma-COPD overlap syndrome - Lung function deferred today.  - We will not make any medication changes at this time.  - When you apply for Medicare later this year, make sure that you get a SUPPLEMENTAL policy (Call Tammy with questions).  - Daily controller medication(s): Singulair 10mg  daily + Symbicort 160/4.82mcg two puffs twice daily with spacer + Nucala monthly   - Prior to physical activity: ProAir 2 puffs 10-15 minutes before physical activity. - Rescue medications: ProAir 4 puffs every 4-6 hours as needed or albuterol nebulizer one vial every 4-6 hours as needed - Changes during respiratory infections or worsening symptoms: Add on Asmanex 158mcg to 2 puffs twice daily for ONE TO TWO WEEKS. - Asthma control goals:  * Full participation in all desired activities (may need albuterol before activity) * Albuterol use two time or less a week on average (not counting use with activity) * Cough interfering with sleep two time or less a month * Oral steroids no more than once a year * No hospitalizations  2. Seasonal and perennial allergic rhinitis - Continue with Dymista 1-2 sprays per nostril up to twice daily as needed.  - Continue with cetirizine 10mg  daily. - Continue with montelukast (Singulair) 10mg  daily.   3. Return in about 4 months (around 12/12/2018).   Subjective:   Alicia Hall is a 63 y.o. female presenting today for follow up of  Chief Complaint  Patient presents with  . Asthma  . Immunotherapy    Nucala      Alicia Hall has a history of the following: Patient Active Problem List   Diagnosis Date Noted  . Pulmonary nodules 09/22/2016  . Asthma-COPD overlap syndrome ([place]) 07/28/2016  . COPD, moderate ([place]) 07/05/2015  . Hypersomnia with sleep apnea 06/19/2015  . OSA (obstructive sleep apnea) 06/19/2015  . Depression 06/19/2015  . Insomnia 06/19/2015  . Lung nodule 03/08/2015  . History of colonic polyps   . GERD (gastroesophageal reflux disease) 01/25/2015  . Dysphagia 01/25/2015  . Encounter for screening colonoscopy 01/25/2015  . COPD (chronic obstructive pulmonary disease) ([place]) 12/18/2014  . Moderate persistent asthma 11/23/2014  . Dyspnea and respiratory abnormality 11/23/2014  . Smoking history 11/23/2014  . Upper airway cough syndrome 11/23/2014  . Perennial allergic rhinitis 11/15/2014    History obtained from: chart review and patient.  Alicia Hall is a 63 y.o. female presenting for a follow up visit.  She was last seen in February 2020.  At that time, her lung function looked great.  We continued her on Singulair 10 mg daily, Symbicort 160/4.5 mcg 2 puffs twice daily, and mepolizumab monthly.  We also continued albuterol as needed.  For her rhinitis, we continue Dymista 1 to 2 sprays per nostril up to twice daily, montelukast 10mg  daily, and cetirizine 10mg  daily.   Since last visit, Alicia Hall has done very well.  She remains on her Symbicort 160/4.5 mcg 2 puffs twice daily.  She also remains on her montelukast.  She has been getting her mepolizumab monthly as recommended, and this is prevented her needing steroids and probably over a year.  She feels much more active and much more able to do her activities of daily living.  Natahlia's asthma has been well controlled. She has not required rescue medication, experienced nocturnal awakenings due to lower respiratory symptoms, nor have activities of daily living been limited. She has required no Emergency Department or Urgent Care visits  for her asthma. She has required zero courses of systemic steroids for asthma exacerbations since the last visit. ACT score today is 23, indicating excellent asthma symptom control.   She remains on her Dymista, which she is using as needed only.  She also uses the cetirizine, but does this daily.  She has not required any antibiotics whatsoever since last visit.  Otherwise, there have been no changes to her past medical history, surgical history, family history, or social history.  She does have a lot of questions about transitioning to Medicare in October.    Review of Systems  Constitutional: Negative.  Negative for chills, fever, malaise/fatigue and weight loss.  HENT: Negative.  Negative for congestion, ear discharge, ear pain and sore throat.   Eyes: Negative for pain, discharge and redness.  Respiratory: Negative for cough, sputum production, shortness of breath and wheezing.   Cardiovascular: Negative.  Negative for chest pain and palpitations.  Gastrointestinal: Negative for abdominal pain, constipation, diarrhea, heartburn, nausea and vomiting.  Skin: Negative.  Negative for itching and rash.  Neurological: Negative for dizziness and headaches.  Endo/Heme/Allergies: Negative for environmental allergies. Does not bruise/bleed easily.       Objective:   Blood pressure 120/74, pulse 70, temperature 98.9 F (37.2 C), temperature source Temporal, resp. rate 18, SpO2 97 %. There is no height or weight on file to calculate BMI.   Physical Exam:  Physical Exam  Constitutional: She appears well-developed.  Pleasant female.  Talkative as ever.  HENT:  Head: Normocephalic and atraumatic.  Right Ear: Tympanic membrane, external ear and ear canal normal.  Left Ear: Tympanic membrane, external ear and ear canal normal.  Nose: Mucosal edema and rhinorrhea present. No nasal deformity or septal deviation. No epistaxis. Right sinus exhibits no maxillary sinus tenderness and no frontal  sinus tenderness. Left sinus exhibits no maxillary sinus tenderness and no frontal sinus tenderness.  Mouth/Throat: Uvula is midline and oropharynx is clear and moist. Mucous membranes are not pale and not dry.  Cobblestoning present in the posterior oropharynx.  Eyes: Pupils are equal, round, and reactive to light. Conjunctivae and EOM are normal. Right eye exhibits no chemosis and no discharge. Left eye exhibits no chemosis and no discharge. Right conjunctiva is not injected. Left conjunctiva is not injected.  Cardiovascular: Normal rate, regular rhythm and normal heart sounds.  Respiratory: Effort normal and breath sounds normal. No accessory muscle usage. No tachypnea. No respiratory distress. She has no wheezes. She has no rhonchi. She has no rales. She exhibits no tenderness.  Moving air well in all lung fields.  No increased work of breathing.  Lymphadenopathy:    She has no cervical adenopathy.  Neurological: She is alert.  Skin: No abrasion, no petechiae and no rash noted. Rash is not papular, not vesicular and not urticarial. No erythema. No pallor.  Psychiatric: She has a normal mood and affect.     Diagnostic studies: none       Salvatore Marvel, MD  [facility]

## 2018-08-12 NOTE — Patient Instructions (Addendum)
1. Asthma-COPD overlap syndrome - Lung function deferred today.  - We will not make any medication changes at this time.  - When you apply for Medicare later this year, make sure that you get a SUPPLEMENTAL policy (Call Tammy with questions).  - Daily controller medication(s): Singulair 10mg  daily + Symbicort 160/4.23mcg two puffs twice daily with spacer + Nucala monthly   - Prior to physical activity: ProAir 2 puffs 10-15 minutes before physical activity. - Rescue medications: ProAir 4 puffs every 4-6 hours as needed or albuterol nebulizer one vial every 4-6 hours as needed - Changes during respiratory infections or worsening symptoms: Add on Asmanex 172mcg to 2 puffs twice daily for ONE TO TWO WEEKS. - Asthma control goals:  * Full participation in all desired activities (may need albuterol before activity) * Albuterol use two time or less a week on average (not counting use with activity) * Cough interfering with sleep two time or less a month * Oral steroids no more than once a year * No hospitalizations  2. Seasonal and perennial allergic rhinitis - Continue with Dymista 1-2 sprays per nostril up to twice daily as needed.  - Continue with cetirizine 10mg  daily. - Continue with montelukast (Singulair) 10mg  daily.   3. Return in about 4 months (around 12/12/2018).   Please inform us of any Emergency Department visits, hospitalizations, or changes in symptoms. Call us before going to the ED for breathing or allergy symptoms since we might be able to fit you in for a sick visit. Feel free to contact us anytime with any questions, problems, or concerns.  It was a pleasure to see you again today!  Websites that have reliable patient information: 1. American Academy of Asthma, Allergy, and Immunology: www.aaaai.org 2. Food Allergy Research and Education (FARE): foodallergy.org 3. Mothers of Asthmatics: http://www.asthmacommunitynetwork.org 4. American College of Allergy, Asthma, and  Immunology: www.acaai.org

## 2018-08-14 ENCOUNTER — Encounter: Payer: Self-pay | Admitting: Allergy & Immunology

## 2018-08-21 DIAGNOSIS — L249 Irritant contact dermatitis, unspecified cause: Secondary | ICD-10-CM | POA: Diagnosis not present

## 2018-09-08 DIAGNOSIS — J4550 Severe persistent asthma, uncomplicated: Secondary | ICD-10-CM | POA: Diagnosis not present

## 2018-09-09 ENCOUNTER — Other Ambulatory Visit: Payer: Self-pay

## 2018-09-09 ENCOUNTER — Ambulatory Visit (INDEPENDENT_AMBULATORY_CARE_PROVIDER_SITE_OTHER): Payer: BC Managed Care – PPO

## 2018-09-09 DIAGNOSIS — J4550 Severe persistent asthma, uncomplicated: Secondary | ICD-10-CM

## 2018-10-06 DIAGNOSIS — J4550 Severe persistent asthma, uncomplicated: Secondary | ICD-10-CM | POA: Diagnosis not present

## 2018-10-07 ENCOUNTER — Other Ambulatory Visit: Payer: Self-pay

## 2018-10-07 ENCOUNTER — Ambulatory Visit (INDEPENDENT_AMBULATORY_CARE_PROVIDER_SITE_OTHER): Payer: BC Managed Care – PPO

## 2018-10-07 DIAGNOSIS — J4550 Severe persistent asthma, uncomplicated: Secondary | ICD-10-CM | POA: Diagnosis not present

## 2018-11-01 DIAGNOSIS — J4550 Severe persistent asthma, uncomplicated: Secondary | ICD-10-CM | POA: Diagnosis not present

## 2018-11-02 ENCOUNTER — Ambulatory Visit (INDEPENDENT_AMBULATORY_CARE_PROVIDER_SITE_OTHER): Payer: BC Managed Care – PPO

## 2018-11-02 ENCOUNTER — Other Ambulatory Visit: Payer: Self-pay

## 2018-11-02 DIAGNOSIS — J4550 Severe persistent asthma, uncomplicated: Secondary | ICD-10-CM

## 2018-11-04 ENCOUNTER — Ambulatory Visit: Payer: Self-pay

## 2018-11-29 DIAGNOSIS — J4550 Severe persistent asthma, uncomplicated: Secondary | ICD-10-CM | POA: Diagnosis not present

## 2018-11-30 ENCOUNTER — Ambulatory Visit (INDEPENDENT_AMBULATORY_CARE_PROVIDER_SITE_OTHER): Payer: Medicare Other

## 2018-11-30 DIAGNOSIS — J4550 Severe persistent asthma, uncomplicated: Secondary | ICD-10-CM | POA: Diagnosis not present

## 2018-12-14 ENCOUNTER — Ambulatory Visit (INDEPENDENT_AMBULATORY_CARE_PROVIDER_SITE_OTHER): Payer: Medicare Other | Admitting: Allergy & Immunology

## 2018-12-14 ENCOUNTER — Other Ambulatory Visit: Payer: Self-pay

## 2018-12-14 ENCOUNTER — Encounter: Payer: Self-pay | Admitting: Allergy & Immunology

## 2018-12-14 VITALS — BP 144/84 | HR 76 | Temp 99.6°F | Resp 20 | Ht 62.5 in | Wt 220.4 lb

## 2018-12-14 DIAGNOSIS — J4540 Moderate persistent asthma, uncomplicated: Secondary | ICD-10-CM

## 2018-12-14 DIAGNOSIS — J3089 Other allergic rhinitis: Secondary | ICD-10-CM | POA: Diagnosis not present

## 2018-12-14 DIAGNOSIS — R918 Other nonspecific abnormal finding of lung field: Secondary | ICD-10-CM | POA: Diagnosis not present

## 2018-12-14 MED ORDER — ASMANEX HFA 100 MCG/ACT IN AERO: 2.0000 | INHALATION_SPRAY | Freq: Two times a day (BID) | RESPIRATORY_TRACT | 3 refills | Status: DC

## 2018-12-14 MED ORDER — CETIRIZINE HCL 10 MG PO TABS: 10.0000 mg | ORAL_TABLET | Freq: Every day | ORAL | 5 refills | Status: DC

## 2018-12-14 MED ORDER — MONTELUKAST SODIUM 10 MG PO TABS: 10.0000 mg | ORAL_TABLET | Freq: Every day | ORAL | 5 refills | Status: DC

## 2018-12-14 MED ORDER — ALBUTEROL SULFATE HFA 108 (90 BASE) MCG/ACT IN AERS: INHALATION_SPRAY | RESPIRATORY_TRACT | 5 refills | Status: DC

## 2018-12-14 MED ORDER — SYMBICORT 160-4.5 MCG/ACT IN AERO: 2.0000 | INHALATION_SPRAY | Freq: Two times a day (BID) | RESPIRATORY_TRACT | 5 refills | Status: DC

## 2018-12-14 NOTE — Patient Instructions (Addendum)
1. Asthma-COPD overlap syndrome - Lung function looked great today. - We will not make any medication changes at this time.  - I think we are on a good path with your asthma medications.  - Daily controller medication(s): Singulair 10mg  daily + Symbicort 160/4.29mcg two puffs twice daily with spacer + Nucala monthly   - Prior to physical activity: ProAir 2 puffs 10-15 minutes before physical activity. - Rescue medications: ProAir 4 puffs every 4-6 hours as needed or albuterol nebulizer one vial every 4-6 hours as needed - Changes during respiratory infections or worsening symptoms: Add on Asmanex 149mcg to 2 puffs twice daily for ONE TO TWO WEEKS. - Asthma control goals:  * Full participation in all desired activities (may need albuterol before activity) * Albuterol use two time or less a week on average (not counting use with activity) * Cough interfering with sleep two time or less a month * Oral steroids no more than once a year * No hospitalizations  2. Seasonal and perennial allergic rhinitis - Continue with Dymista 1-2 sprays per nostril up to twice daily as needed.  - Continue with cetirizine 10mg  daily. - Continue with montelukast (Singulair) 10mg  daily.   3. Return in about 6 months (around 06/14/2019).   Please inform us of any Emergency Department visits, hospitalizations, or changes in symptoms. Call us before going to the ED for breathing or allergy symptoms since we might be able to fit you in for a sick visit. Feel free to contact us anytime with any questions, problems, or concerns.  It was a pleasure to see you again today!  Websites that have reliable patient information: 1. American Academy of Asthma, Allergy, and Immunology: www.aaaai.org 2. Food Allergy Research and Education (FARE): foodallergy.org 3. Mothers of Asthmatics: http://www.asthmacommunitynetwork.org 4. American College of Allergy, Asthma, and Immunology: www.acaai.org

## 2018-12-14 NOTE — Progress Notes (Signed)
FOLLOW UP  Date of Service/Encounter:  12/14/18   Assessment:   Asthma-COPD overlap syndrome- with eosinophilic phenotypeon Nucala  Seasonal and perennial allergic rhinitis(grasses, weeds, trees, molds, dust mites, cat, horse, and cockroach)  Pulmonary nodules- followed by Dr. Chase Caller (last chest CT October 2018 stable)   Alicia Hall is doing well from an asthma and allergy perspective.  Her breathing has never been so well controlled.  We are going to try to stepdown her therapy by decreasing her Symbicort dosing.  I recommended 1 puff twice daily.  She is going to play around with that may be due to puffs once daily to see how she does.  She can always change the dose at home depending on her symptoms.  She has not needed to add her Asmanex at all, which is very reassuring.  Similarly, she has not needed prednisone.  I am very pleased with how well she is doing.  When I first met her she was sick constantly and requiring prednisone on a regular basis.  Plan/Recommendations:   1. Asthma-COPD overlap syndrome - Lung function looked great today. - We will not make any medication changes at this time.  - I think we are on a good path with your asthma medications.  - Daily controller medication(s): Singulair 75m daily + Symbicort 160/4.556m two puffs twice daily with spacer + Nucala monthly   - Prior to physical activity: ProAir 2 puffs 10-15 minutes before physical activity. - Rescue medications: ProAir 4 puffs every 4-6 hours as needed or albuterol nebulizer one vial every 4-6 hours as needed - Changes during respiratory infections or worsening symptoms: Add on Asmanex 10024mto 2 puffs twice daily for ONE TO TWO WEEKS. - Asthma control goals:  * Full participation in all desired activities (may need albuterol before activity) * Albuterol use two time or less a week on average (not counting use with activity) * Cough interfering with sleep two time or less a month * Oral  steroids no more than once a year * No hospitalizations  2. Seasonal and perennial allergic rhinitis - Continue with Dymista 1-2 sprays per nostril up to twice daily as needed.  - Continue with cetirizine 76m43mily. - Continue with montelukast (Singulair) 76mg25mly.   3. Return in about 6 months (around 06/14/2019).    Subjective:   Alicia Hall 62 y.10 female presenting today for follow up of  Chief Complaint  Patient presents with  . Asthma    No problems  . Allergies    Sneezing sometimes.     Alicia Hall has a history of the following: Patient Active Problem List   Diagnosis Date Noted  . Pulmonary nodules 09/22/2016  . Asthma-COPD overlap syndrome (HCC) [place]05/2018  . COPD, moderate (HCC) [place]12/2017  . Hypersomnia with sleep apnea 06/19/2015  . OSA (obstructive sleep apnea) 06/19/2015  . Depression 06/19/2015  . Insomnia 06/19/2015  . Lung nodule 03/08/2015  . History of colonic polyps   . GERD (gastroesophageal reflux disease) 01/25/2015  . Dysphagia 01/25/2015  . Encounter for screening colonoscopy 01/25/2015  . COPD (chronic obstructive pulmonary disease) (HCC) [place]25/2016  . Moderate persistent asthma 11/23/2014  . Dyspnea and respiratory abnormality 11/23/2014  . Smoking history 11/23/2014  . Upper airway cough syndrome 11/23/2014  . Perennial allergic rhinitis 11/15/2014    History obtained from: chart review and patient.  Alicia Hall 62 y.60 female presenting for a follow up visit.  Patient was last seen in  June 2020.  At that time, she was doing very well.  We continued her on Singulair 10 mg daily as well as Symbicort 160 mcg 2 puffs twice daily with a spacer and mepolizumab monthly.  She was doing very well on this regimen.  She does have Asmanex to add during respiratory flares.  For her allergic rhinitis, we continued with Dymista 1 to 2 sprays per nostril up to twice daily as well as cetirizine and montelukast.  In the interim, her husband  passed away unexpectedly.   Asthma/Respiratory Symptom History: She remains on the Symbicort 2 puffs in the morning and 2 puffs at night.  She has not tried titrating this down at all.  She has transition to Medicare and reports that her co-pays for the medication are much more expensive than they used to be.  She has not needed prednisone in probably over a year.  Her mepolizumab injections are going well without any problems whatsoever.  She has not had any nighttime symptoms or daytime symptoms.  ACT score is 23, indicating excellent asthma control.  Allergic Rhinitis Symptom History: She remains on her montelukast and cetirizine.  She has the [place] but does not use it on a daily basis.  She has not needed any antibiotics at all since last visit.   She is doing fairly well all things considered.  She has some good days and some bad days. She has been remaining busy with grandkids and what not.  Otherwise, there have been no changes to her past medical history, surgical history, family history, or social history.    Review of Systems  Constitutional: Negative.  Negative for chills, fever, malaise/fatigue and weight loss.  HENT: Negative.  Negative for congestion, ear discharge and ear pain.   Eyes: Negative for pain, discharge and redness.  Respiratory: Negative for cough, sputum production, shortness of breath and wheezing.   Cardiovascular: Negative.  Negative for chest pain and palpitations.  Gastrointestinal: Negative for abdominal pain, heartburn, nausea and vomiting.  Skin: Negative.  Negative for itching and rash.  Neurological: Negative for dizziness and headaches.  Endo/Heme/Allergies: Negative for environmental allergies. Does not bruise/bleed easily.       Objective:   Blood pressure (!) 144/84, pulse 76, temperature 99.6 F (37.6 C), temperature source Temporal, resp. rate 20, height 5' 2.5" (1.588 m), weight 220 lb 6.4 oz (100 kg), SpO2 95 %. Body mass index is 39.67  kg/m.   Physical Exam:  Physical Exam  Constitutional: She appears well-developed.  Talkative female.  HENT:  Head: Normocephalic and atraumatic.  Right Ear: Tympanic membrane, external ear and ear canal normal.  Left Ear: Tympanic membrane, external ear and ear canal normal.  Nose: Mucosal edema present. No rhinorrhea, nasal deformity or septal deviation. No epistaxis. Right sinus exhibits no maxillary sinus tenderness and no frontal sinus tenderness. Left sinus exhibits no maxillary sinus tenderness and no frontal sinus tenderness.  Mouth/Throat: Uvula is midline and oropharynx is clear and moist. Mucous membranes are not pale and not dry.  Tonsils unremarkable.  Eyes: Pupils are equal, round, and reactive to light. Conjunctivae and EOM are normal. Right eye exhibits no chemosis and no discharge. Left eye exhibits no chemosis and no discharge. Right conjunctiva is not injected. Left conjunctiva is not injected.  Cardiovascular: Normal rate, regular rhythm and normal heart sounds.  Respiratory: Effort normal and breath sounds normal. No accessory muscle usage. No tachypnea. No respiratory distress. She has no wheezes. She has no rhonchi. She has  no rales. She exhibits no tenderness.  Moving air well in all lung fields.  No increased work of breathing.  Lymphadenopathy:    She has no cervical adenopathy.  Neurological: She is alert.  Skin: No abrasion, no petechiae and no rash noted. Rash is not papular, not vesicular and not urticarial. No erythema. No pallor.  No urticarial or eczematous lesions noted.  Psychiatric: She has a normal mood and affect.     Diagnostic studies:    Spirometry: results normal (FEV1: 1.57/76%, FVC: 2.14/75%, FEV1/FVC: 73%).    Spirometry consistent with normal pattern.   Allergy Studies: none        Salvatore Marvel, MD  [facility] [place]

## 2018-12-16 ENCOUNTER — Telehealth: Payer: Self-pay | Admitting: *Deleted

## 2018-12-16 MED ORDER — FLOVENT HFA 220 MCG/ACT IN AERO: 2.0000 | INHALATION_SPRAY | Freq: Two times a day (BID) | RESPIRATORY_TRACT | 5 refills | Status: DC

## 2018-12-16 NOTE — Addendum Note (Signed)
Addended by: Lucrezia Starch I on: 12/16/2018 12:45 PM   Modules accepted: Orders

## 2018-12-16 NOTE — Telephone Encounter (Signed)
Received fax from patient's insurance stating that Asmanex is not covered on the patient's formulary. Please advise an alternative.

## 2018-12-16 NOTE — Telephone Encounter (Signed)
New Prescription for Flovent has been sent.

## 2018-12-16 NOTE — Telephone Encounter (Signed)
Flovent 220 one puff BID PRN would be fine.  Thanks, Salvatore Marvel, MD [facility] [place]

## 2018-12-16 NOTE — Telephone Encounter (Signed)
Called and advised to patient. Patient verbalized understanding.  

## 2018-12-21 ENCOUNTER — Ambulatory Visit: Payer: BC Managed Care – PPO | Admitting: Allergy & Immunology

## 2018-12-21 NOTE — Telephone Encounter (Signed)
Patient called stating symbicort would be $300. Patient states she has a symbicort at home to use for now. Patient will also put the flovent on hold at the pharmacy as she is not needing it right now.  Patient currently has a refill on proair and will hold off on it also.

## 2018-12-27 DIAGNOSIS — J4540 Moderate persistent asthma, uncomplicated: Secondary | ICD-10-CM | POA: Diagnosis not present

## 2018-12-28 ENCOUNTER — Ambulatory Visit (INDEPENDENT_AMBULATORY_CARE_PROVIDER_SITE_OTHER): Payer: Medicare Other

## 2018-12-28 ENCOUNTER — Other Ambulatory Visit: Payer: Self-pay

## 2018-12-28 DIAGNOSIS — J4540 Moderate persistent asthma, uncomplicated: Secondary | ICD-10-CM | POA: Diagnosis not present

## 2019-01-24 DIAGNOSIS — J4550 Severe persistent asthma, uncomplicated: Secondary | ICD-10-CM | POA: Diagnosis not present

## 2019-01-25 ENCOUNTER — Other Ambulatory Visit: Payer: Self-pay

## 2019-01-25 ENCOUNTER — Ambulatory Visit (INDEPENDENT_AMBULATORY_CARE_PROVIDER_SITE_OTHER): Payer: Medicare Other

## 2019-01-25 DIAGNOSIS — J4550 Severe persistent asthma, uncomplicated: Secondary | ICD-10-CM | POA: Diagnosis not present

## 2019-01-25 DIAGNOSIS — J4540 Moderate persistent asthma, uncomplicated: Secondary | ICD-10-CM

## 2019-02-21 DIAGNOSIS — J4550 Severe persistent asthma, uncomplicated: Secondary | ICD-10-CM | POA: Diagnosis not present

## 2019-02-22 ENCOUNTER — Ambulatory Visit (INDEPENDENT_AMBULATORY_CARE_PROVIDER_SITE_OTHER): Payer: Medicare Other

## 2019-02-22 DIAGNOSIS — J4550 Severe persistent asthma, uncomplicated: Secondary | ICD-10-CM | POA: Diagnosis not present

## 2019-03-21 DIAGNOSIS — J4540 Moderate persistent asthma, uncomplicated: Secondary | ICD-10-CM | POA: Diagnosis not present

## 2019-03-22 ENCOUNTER — Ambulatory Visit (INDEPENDENT_AMBULATORY_CARE_PROVIDER_SITE_OTHER): Payer: Medicare Other

## 2019-03-22 ENCOUNTER — Other Ambulatory Visit: Payer: Self-pay

## 2019-03-22 DIAGNOSIS — J4540 Moderate persistent asthma, uncomplicated: Secondary | ICD-10-CM | POA: Diagnosis not present

## 2019-03-29 ENCOUNTER — Encounter: Payer: Self-pay | Admitting: Internal Medicine

## 2019-04-18 DIAGNOSIS — J4550 Severe persistent asthma, uncomplicated: Secondary | ICD-10-CM | POA: Diagnosis not present

## 2019-04-19 ENCOUNTER — Other Ambulatory Visit: Payer: Self-pay

## 2019-04-19 ENCOUNTER — Ambulatory Visit (INDEPENDENT_AMBULATORY_CARE_PROVIDER_SITE_OTHER): Payer: Medicare Other

## 2019-04-19 DIAGNOSIS — J4550 Severe persistent asthma, uncomplicated: Secondary | ICD-10-CM

## 2019-04-19 DIAGNOSIS — J4540 Moderate persistent asthma, uncomplicated: Secondary | ICD-10-CM

## 2019-05-16 DIAGNOSIS — J4550 Severe persistent asthma, uncomplicated: Secondary | ICD-10-CM | POA: Diagnosis not present

## 2019-05-17 ENCOUNTER — Other Ambulatory Visit: Payer: Self-pay

## 2019-05-17 ENCOUNTER — Ambulatory Visit (INDEPENDENT_AMBULATORY_CARE_PROVIDER_SITE_OTHER): Payer: Medicare Other

## 2019-05-17 DIAGNOSIS — J4550 Severe persistent asthma, uncomplicated: Secondary | ICD-10-CM | POA: Diagnosis not present

## 2019-05-17 DIAGNOSIS — J4540 Moderate persistent asthma, uncomplicated: Secondary | ICD-10-CM

## 2019-06-13 DIAGNOSIS — J4540 Moderate persistent asthma, uncomplicated: Secondary | ICD-10-CM | POA: Diagnosis not present

## 2019-06-14 ENCOUNTER — Ambulatory Visit: Payer: Medicare Other

## 2019-06-14 ENCOUNTER — Encounter: Payer: Self-pay | Admitting: Allergy & Immunology

## 2019-06-14 ENCOUNTER — Ambulatory Visit (INDEPENDENT_AMBULATORY_CARE_PROVIDER_SITE_OTHER): Payer: Medicare Other | Admitting: Allergy & Immunology

## 2019-06-14 ENCOUNTER — Other Ambulatory Visit: Payer: Self-pay

## 2019-06-14 VITALS — BP 124/60 | HR 85 | Temp 97.6°F | Resp 18 | Ht 62.0 in

## 2019-06-14 DIAGNOSIS — J4540 Moderate persistent asthma, uncomplicated: Secondary | ICD-10-CM | POA: Diagnosis not present

## 2019-06-14 DIAGNOSIS — J3089 Other allergic rhinitis: Secondary | ICD-10-CM | POA: Diagnosis not present

## 2019-06-14 DIAGNOSIS — R918 Other nonspecific abnormal finding of lung field: Secondary | ICD-10-CM

## 2019-06-14 DIAGNOSIS — H6123 Impacted cerumen, bilateral: Secondary | ICD-10-CM | POA: Diagnosis not present

## 2019-06-14 MED ORDER — EPINEPHRINE 0.3 MG/0.3ML IJ SOAJ: 0.3000 mg | INTRAMUSCULAR | 1 refills | Status: DC | PRN

## 2019-06-14 NOTE — Patient Instructions (Addendum)
1. Asthma-COPD overlap syndrome - Lung function looked great today. - We will not make any medication changes at this time.  - I think we are on a good path with your asthma medications.  - Daily controller medication(s): Singulair 10mg  daily + Symbicort 160/4.65mcg two puffs twice daily with spacer + Nucala monthly   - Prior to physical activity: ProAir 2 puffs 10-15 minutes before physical activity. - Rescue medications: ProAir 4 puffs every 4-6 hours as needed or albuterol nebulizer one vial every 4-6 hours as needed - Changes during respiratory infections or worsening symptoms: Add on Asmanex 171mcg to 2 puffs twice daily for ONE TO TWO WEEKS. - Asthma control goals:  * Full participation in all desired activities (may need albuterol before activity) * Albuterol use two time or less a week on average (not counting use with activity) * Cough interfering with sleep two time or less a month * Oral steroids no more than once a year * No hospitalizations  2. Seasonal and perennial allergic rhinitis - Continue with Dymista 1-2 sprays per nostril up to twice daily as needed.  - Continue with cetirizine 10mg  daily. - Continue with montelukast (Singulair) 10mg  daily.   3. Cerumen impaction - We removed some wax today, but your wax is so dry. - Use 1:1 hydrogen peroxide and water 1-2 times daily with a syringe.  - We will check out your ear again next time you come in for a shot.   4. Return in about 3 months (around 09/13/2019). This can be an in-person, a virtual Webex or a telephone follow up visit.   Please inform us of any Emergency Department visits, hospitalizations, or changes in symptoms. Call us before going to the ED for breathing or allergy symptoms since we might be able to fit you in for a sick visit. Feel free to contact us anytime with any questions, problems, or concerns.  It was a pleasure to see you again today! WE LOVE [place] SOME [place]!   Websites that have reliable  patient information: 1. American Academy of Asthma, Allergy, and Immunology: www.aaaai.org 2. Food Allergy Research and Education (FARE): foodallergy.org 3. Mothers of Asthmatics: http://www.asthmacommunitynetwork.org 4. American College of Allergy, Asthma, and Immunology: www.acaai.org   COVID-19 Vaccine Information can be found at: ShippingScam.co.uk For questions related to vaccine distribution or appointments, please email [email or web address] .[email or web address] or call [phone].     "Like" [place] on Facebook and Instagram for our latest updates!       HAPPY SPRING!  Make sure you are registered to vote! If you have moved or changed any of your contact information, you will need to get this updated before voting!  In some cases, you MAY be able to register to vote online: [email or web address]

## 2019-06-14 NOTE — Progress Notes (Signed)
FOLLOW UP  Date of Service/Encounter:  06/14/19   Assessment:   Asthma-COPD overlap syndrome- with eosinophilic phenotypeon Nucala  Seasonal and perennial allergic rhinitis(grasses, weeds, trees, molds, dust mites, cat, horse, and cockroach)  Pulmonary nodules- followed by Dr. Chase Caller (last chest CT October 2018 stable)  Cerumen impaction - some cerumen removed in clinic today   Plan/Recommendations:   1. Asthma-COPD overlap syndrome - Lung function looked great today. - We will not make any medication changes at this time.  - I think we are on a good path with your asthma medications.  - Daily controller medication(s): Singulair 10mg  daily + Symbicort 160/4.41mcg two puffs twice daily with spacer + Nucala monthly   - Prior to physical activity: ProAir 2 puffs 10-15 minutes before physical activity. - Rescue medications: ProAir 4 puffs every 4-6 hours as needed or albuterol nebulizer one vial every 4-6 hours as needed - Changes during respiratory infections or worsening symptoms: Add on Asmanex 145mcg to 2 puffs twice daily for ONE TO TWO WEEKS. - Asthma control goals:  * Full participation in all desired activities (may need albuterol before activity) * Albuterol use two time or less a week on average (not counting use with activity) * Cough interfering with sleep two time or less a month * Oral steroids no more than once a year * No hospitalizations  2. Seasonal and perennial allergic rhinitis - Continue with Dymista 1-2 sprays per nostril up to twice daily as needed.  - Continue with cetirizine 10mg  daily. - Continue with montelukast (Singulair) 10mg  daily.   3. Cerumen impaction - We removed some wax today, but your wax is so dry. - Use 1:1 hydrogen peroxide and water 1-2 times daily with a syringe.  - We will check out your ear again next time you come in for a shot.   4. Return in about 3 months (around 09/13/2019). This can be an in-person, a virtual Webex  or a telephone follow up visit.   Subjective:   Alicia Hall is a 64 y.o. female presenting today for follow up of  Chief Complaint  Patient presents with  . Asthma    Alicia Hall has a history of the following: Patient Active Problem List   Diagnosis Date Noted  . Pulmonary nodules 09/22/2016  . Asthma-COPD overlap syndrome ([place]) 07/28/2016  . COPD, moderate ([place]) 07/05/2015  . Hypersomnia with sleep apnea 06/19/2015  . OSA (obstructive sleep apnea) 06/19/2015  . Depression 06/19/2015  . Insomnia 06/19/2015  . Lung nodule 03/08/2015  . History of colonic polyps   . GERD (gastroesophageal reflux disease) 01/25/2015  . Dysphagia 01/25/2015  . Encounter for screening colonoscopy 01/25/2015  . COPD (chronic obstructive pulmonary disease) ([place]) 12/18/2014  . Moderate persistent asthma 11/23/2014  . Dyspnea and respiratory abnormality 11/23/2014  . Smoking history 11/23/2014  . Upper airway cough syndrome 11/23/2014  . Perennial allergic rhinitis 11/15/2014    History obtained from: chart review and patient.  Alicia Hall is a 64 y.o. female presenting for a follow up visit.  She was last seen in October 2020.  At that time, her lung function looked excellent.  We did not make changes.  We continued Singulair, Symbicort, and mepolizumab.  She has Asmanex that she adds on during respiratory flares.  For her rhinitis, we continue Dymista, cetirizine, and Singulair.   Since the last visit, she has done well. She has up days and down days since her husband passed away. It will be one  year on June 23rd, 2021.   Asthma/Respiratory Symptom History: She remains on Symbicort 2 puffs in the morning and 2 puffs at night.  She is also on the monthly Nucala injections. These are controlling her symptoms well without adverse event.  Alicia Hall asthma has been well controlled. She has not required rescue medication, experienced nocturnal awakenings due to lower respiratory symptoms, nor have  activities of daily living been limited. She has required no Emergency Department or Urgent Care visits for her asthma. She has required zero courses of systemic steroids for asthma exacerbations since the last visit. ACT score today is 25, indicating excellent asthma symptom control. She has not needed her Asmanex at all.   Allergic Rhinitis Symptom History: She remains on her nasal spray as well as the cetirizine and Singulair.  She has not needed any antibiotics at all since last visit.  She does report some right ear fullness.  She went to see her PCP yesterday, he did not note any abnormalities.  She has not had a fever with this.  She has a bone spur on her right shoulder. She had an injury a while ago and never had a X-ray to check on it until just recently. She is going to see a Restaurant manager, fast food.    Otherwise, there have been no changes to her past medical history, surgical history, family history, or social history.    Review of Systems  Constitutional: Negative.  Negative for fever, malaise/fatigue and weight loss.  HENT: Positive for ear pain. Negative for congestion, ear discharge and nosebleeds.   Eyes: Negative for pain, discharge and redness.  Respiratory: Negative for cough, sputum production, shortness of breath and wheezing.   Cardiovascular: Negative.  Negative for chest pain and palpitations.  Gastrointestinal: Negative for abdominal pain, constipation, diarrhea, heartburn, nausea and vomiting.  Skin: Negative.  Negative for itching and rash.  Neurological: Negative for dizziness and headaches.  Endo/Heme/Allergies: Negative for environmental allergies. Does not bruise/bleed easily.       Objective:   Blood pressure 124/60, pulse 85, temperature 97.6 F (36.4 C), temperature source Temporal, resp. rate 18, height 5\' 2"  (1.575 m), SpO2 95 %. Body mass index is 40.31 kg/m.   Physical Exam:  Physical Exam  Constitutional: She appears well-developed.  HENT:  Head:  Normocephalic and atraumatic.  Right Ear: Tympanic membrane, external ear and ear canal normal.  Left Ear: Tympanic membrane, external ear and ear canal normal.  Nose: No mucosal edema, rhinorrhea, nasal deformity or septal deviation. No epistaxis. Right sinus exhibits no maxillary sinus tenderness and no frontal sinus tenderness. Left sinus exhibits no maxillary sinus tenderness and no frontal sinus tenderness.  Mouth/Throat: Uvula is midline and oropharynx is clear and moist. Mucous membranes are not pale and not dry.  Dried cerumen in the bilateral external auditory canals.  This was removed via instrumentation as well as lavaged with warm water in the clinic.  Unfortunately, we cannot get all of it out.  The tympanic membranes themselves look fairly normal.  Eyes: Pupils are equal, round, and reactive to light. Conjunctivae and EOM are normal. Right eye exhibits no chemosis and no discharge. Left eye exhibits no chemosis and no discharge. Right conjunctiva is not injected. Left conjunctiva is not injected.  Cardiovascular: Normal rate, regular rhythm and normal heart sounds.  Respiratory: Effort normal and breath sounds normal. No accessory muscle usage. No tachypnea. No respiratory distress. She has no wheezes. She has no rhonchi. She has no rales. She exhibits no tenderness.  Moving air well in all lung fields.  No increased work of breathing.  Lymphadenopathy:    She has no cervical adenopathy.  Neurological: She is alert.  Skin: No abrasion, no petechiae and no rash noted. Rash is not papular, not vesicular and not urticarial. No erythema. No pallor.  Psychiatric: She has a normal mood and affect.     Diagnostic studies:    Spirometry: results abnormal (FEV1: 1.42/64%, FVC: 2.00/69%, FEV1/FVC: 71%).    Spirometry consistent with possible restrictive disease.  Overall, her values are slightly lower than previous spirometric findings, but she is asymptomatic.    Allergy Studies:  none       Salvatore Marvel, MD  [facility] [place]

## 2019-06-14 NOTE — Addendum Note (Signed)
Addended by: Valere Dross on: 06/14/2019 04:51 PM   Modules accepted: Orders

## 2019-06-21 ENCOUNTER — Telehealth: Payer: Self-pay

## 2019-06-21 NOTE — Telephone Encounter (Signed)
PA has been submitted through CoverMyMeds for Auvi Q and is currently pending approval or denial.

## 2019-06-21 NOTE — Telephone Encounter (Signed)
Patient called and stated that she received a call from [facility] and was told that her AUVI-Q required a prior authorization. She verbalized that she was told that it will cost her $25 with the authorization and that she is willing to pay that. She requests that when call her back to please leave her a voicemail and speak loudly so that she can hear.

## 2019-06-22 MED ORDER — EPINEPHRINE 0.3 MG/0.3ML IJ SOAJ: 0.3000 mg | Freq: Once | INTRAMUSCULAR | 1 refills | Status: AC

## 2019-06-22 NOTE — Telephone Encounter (Signed)
PA was denied for Auvi Q through the patient's Medicare Part D plan stating that she has to try and fail generic EpiPen Mylan or Teva brand first. Called patient and left voicemail asking for patient to return call to inform and send in a Generic EpiPen for her.

## 2019-06-22 NOTE — Telephone Encounter (Signed)
Patient called back and was advised about the PA denial. Patient verbalized understanding and asked for the generic EpiPen to be sent to the [facility] in [place]. I advised the patient that if there were any issues or if she needed anything further to please call back and let me know. Patient verbalized understanding.

## 2019-07-11 DIAGNOSIS — J4550 Severe persistent asthma, uncomplicated: Secondary | ICD-10-CM | POA: Diagnosis not present

## 2019-07-12 ENCOUNTER — Ambulatory Visit (INDEPENDENT_AMBULATORY_CARE_PROVIDER_SITE_OTHER): Payer: Medicare Other

## 2019-07-12 DIAGNOSIS — J4550 Severe persistent asthma, uncomplicated: Secondary | ICD-10-CM

## 2019-07-12 DIAGNOSIS — J4540 Moderate persistent asthma, uncomplicated: Secondary | ICD-10-CM

## 2019-08-08 DIAGNOSIS — J4550 Severe persistent asthma, uncomplicated: Secondary | ICD-10-CM | POA: Diagnosis not present

## 2019-08-09 ENCOUNTER — Ambulatory Visit (INDEPENDENT_AMBULATORY_CARE_PROVIDER_SITE_OTHER): Payer: Medicare Other

## 2019-08-09 ENCOUNTER — Other Ambulatory Visit: Payer: Self-pay

## 2019-08-09 DIAGNOSIS — J4550 Severe persistent asthma, uncomplicated: Secondary | ICD-10-CM | POA: Diagnosis not present

## 2019-08-09 DIAGNOSIS — J4540 Moderate persistent asthma, uncomplicated: Secondary | ICD-10-CM

## 2019-08-28 ENCOUNTER — Other Ambulatory Visit: Payer: Self-pay | Admitting: Allergy & Immunology

## 2019-09-05 DIAGNOSIS — J4550 Severe persistent asthma, uncomplicated: Secondary | ICD-10-CM | POA: Diagnosis not present

## 2019-09-06 ENCOUNTER — Ambulatory Visit: Payer: Medicare Other | Admitting: Allergy & Immunology

## 2019-09-06 ENCOUNTER — Encounter: Payer: Self-pay | Admitting: Allergy & Immunology

## 2019-09-06 ENCOUNTER — Other Ambulatory Visit: Payer: Self-pay

## 2019-09-06 ENCOUNTER — Ambulatory Visit (INDEPENDENT_AMBULATORY_CARE_PROVIDER_SITE_OTHER): Payer: Medicare Other

## 2019-09-06 VITALS — BP 140/70 | HR 78 | Temp 98.9°F | Resp 18

## 2019-09-06 DIAGNOSIS — J4550 Severe persistent asthma, uncomplicated: Secondary | ICD-10-CM

## 2019-09-06 DIAGNOSIS — J3089 Other allergic rhinitis: Secondary | ICD-10-CM | POA: Diagnosis not present

## 2019-09-06 DIAGNOSIS — J4540 Moderate persistent asthma, uncomplicated: Secondary | ICD-10-CM

## 2019-09-06 NOTE — Progress Notes (Signed)
FOLLOW UP  Date of Service/Encounter:  09/06/19   Assessment:   Asthma-COPD overlap syndrome- with eosinophilic phenotypeon Nucala  Seasonal and perennial allergic rhinitis(grasses, weeds, trees, molds, dust mites, cat, horse, and cockroach)  Pulmonary nodules- followed by Dr. Chase Caller (last chest CT October 2018 stable)  Plan/Recommendations:   1. Asthma-COPD overlap syndrome - Lung function looked AWESOME today. - I think you have a good grasp on your symptoms. - Daily controller medication(s): Singulair 10mg  daily + Symbicort 160/4.55mcg two puffs 1-2 times daily with spacer + Nucala monthly    - Prior to physical activity: ProAir 2 puffs 10-15 minutes before physical activity. - Rescue medications: ProAir 4 puffs every 4-6 hours as needed or albuterol nebulizer one vial every 4-6 hours as needed - Changes during respiratory infections or worsening symptoms: Add on Asmanex 157mcg to 2 puffs twice daily for ONE TO TWO WEEKS. - Asthma control goals:  * Full participation in all desired activities (may need albuterol before activity) * Albuterol use two time or less a week on average (not counting use with activity) * Cough interfering with sleep two time or less a month * Oral steroids no more than once a year * No hospitalizations  2. Seasonal and perennial allergic rhinitis - Continue with Nasacort 1-2 sprays per nostril up to twice daily as needed.  - Continue with cetirizine 10mg  daily. - Continue with montelukast (Singulair) 10mg  daily.   3. Return in about 6 months (around 03/08/2020). This can be an in-person, a virtual Webex or a telephone follow up visit.   Subjective:   Alicia Hall is a 64 y.o. female presenting today for follow up of  Chief Complaint  Patient presents with  . Follow-up  . Asthma    Alicia Hall has a history of the following: Patient Active Problem List   Diagnosis Date Noted  . Pulmonary nodules 09/22/2016  . Asthma-COPD  overlap syndrome ([place]) 07/28/2016  . COPD, moderate ([place]) 07/05/2015  . Hypersomnia with sleep apnea 06/19/2015  . OSA (obstructive sleep apnea) 06/19/2015  . Depression 06/19/2015  . Insomnia 06/19/2015  . Lung nodule 03/08/2015  . History of colonic polyps   . GERD (gastroesophageal reflux disease) 01/25/2015  . Dysphagia 01/25/2015  . Encounter for screening colonoscopy 01/25/2015  . COPD (chronic obstructive pulmonary disease) ([place]) 12/18/2014  . Moderate persistent asthma 11/23/2014  . Dyspnea and respiratory abnormality 11/23/2014  . Smoking history 11/23/2014  . Upper airway cough syndrome 11/23/2014  . Perennial allergic rhinitis 11/15/2014    History obtained from: chart review and patient.  Alicia Hall is a 64 y.o. female presenting for a follow up visit. She was last seen in April 2021.  At that time, her lung function looked great.  We did not make any medication changes.  We continue with Singulair 10 mg daily as well as Symbicort 2 puffs twice daily.  She has done very well on mepolizumab monthly.  For her allergic rhinitis, would continue with Zyrtec as well as Singulair and Dymista.  We did remove some earwax at the last visit.  Since last visit, she has done well. She has not needed any systemic steroids.    Asthma/Respiratory Symptom History: She remains on the Symbicort two puffs twice daily. However, she does note that she frequently forgets her evening dose. Alicia Hall's asthma has been well controlled. She has not required rescue medication, experienced nocturnal awakenings due to lower respiratory symptoms, nor have activities of daily living been limited. She has  required no Emergency Department or Urgent Care visits for her asthma. She has required zero courses of systemic steroids for asthma exacerbations since the last visit. ACT score today is 23, indicating excellent asthma symptom control.   Allergic Rhinitis Symptom History: She remains on her Nasacort as well as  cetirizine and Singulair.  She has not been on allergy shots.  She has not needed antibiotics.  Otherwise, there have been no changes to her past medical history, surgical history, family history, or social history.    Review of Systems  Constitutional: Negative.  Negative for chills, fever, malaise/fatigue and weight loss.  HENT: Negative.  Negative for congestion, ear discharge, ear pain, sinus pain and sore throat.   Eyes: Negative for pain, discharge and redness.  Respiratory: Negative for cough, sputum production, shortness of breath and wheezing.   Cardiovascular: Negative.  Negative for chest pain and palpitations.  Gastrointestinal: Negative for abdominal pain, constipation, diarrhea, heartburn, nausea and vomiting.  Skin: Negative.  Negative for itching and rash.  Neurological: Negative for dizziness and headaches.  Endo/Heme/Allergies: Negative for environmental allergies. Does not bruise/bleed easily.       Objective:   Blood pressure 140/70, pulse 78, temperature 98.9 F (37.2 C), temperature source Temporal, resp. rate 18, SpO2 97 %. There is no height or weight on file to calculate BMI.   Physical Exam:  Physical Exam Constitutional:      General: She is awake.     Appearance: She is well-developed.  HENT:     Head: Normocephalic and atraumatic.     Right Ear: Tympanic membrane, ear canal and external ear normal.     Left Ear: Tympanic membrane, ear canal and external ear normal.     Nose: No nasal deformity, septal deviation, mucosal edema or rhinorrhea.     Right Turbinates: Enlarged. Not swollen or pale.     Left Turbinates: Enlarged. Not swollen or pale.     Right Sinus: No maxillary sinus tenderness or frontal sinus tenderness.     Left Sinus: No maxillary sinus tenderness or frontal sinus tenderness.     Mouth/Throat:     Mouth: Mucous membranes are not pale and not dry.     Pharynx: Uvula midline.     Comments: Cobblestoning present in the posterior  oropharynx.  Eyes:     General:        Right eye: No discharge.        Left eye: No discharge.     Conjunctiva/sclera: Conjunctivae normal.     Right eye: Right conjunctiva is not injected. No chemosis.    Left eye: Left conjunctiva is not injected. No chemosis.    Pupils: Pupils are equal, round, and reactive to light.     Comments: Allergic shiners bilaterally.   Cardiovascular:     Rate and Rhythm: Normal rate and regular rhythm.     Heart sounds: Normal heart sounds.  Pulmonary:     Effort: Pulmonary effort is normal. No tachypnea, accessory muscle usage or respiratory distress.     Breath sounds: Normal breath sounds. No wheezing, rhonchi or rales.     Comments: Moving air well in all lung fields. No increased work of breathing noted.  Chest:     Chest wall: No tenderness.  Lymphadenopathy:     Cervical: No cervical adenopathy.  Skin:    Coloration: Skin is not pale.     Findings: No abrasion, erythema, petechiae or rash. Rash is not papular, urticarial or vesicular.  Neurological:  Mental Status: She is alert and easily aroused.      Diagnostic studies:    Spirometry: results normal (FEV1: 1.52/69%, FVC: 2.06/71%, FEV1/FVC: 74%).    Spirometry consistent with possible restrictive disease.   Allergy Studies: none       Salvatore Marvel, MD  [facility]

## 2019-09-06 NOTE — Patient Instructions (Addendum)
1. Asthma-COPD overlap syndrome - Lung function looked AWESOME today. - I think you have a good grasp on your symptoms. - Daily controller medication(s): Singulair 10mg  daily + Symbicort 160/4.21mcg two puffs 1-2 times daily with spacer + Nucala monthly    - Prior to physical activity: ProAir 2 puffs 10-15 minutes before physical activity. - Rescue medications: ProAir 4 puffs every 4-6 hours as needed or albuterol nebulizer one vial every 4-6 hours as needed - Changes during respiratory infections or worsening symptoms: Add on Asmanex 153mcg to 2 puffs twice daily for ONE TO TWO WEEKS. - Asthma control goals:  * Full participation in all desired activities (may need albuterol before activity) * Albuterol use two time or less a week on average (not counting use with activity) * Cough interfering with sleep two time or less a month * Oral steroids no more than once a year * No hospitalizations  2. Seasonal and perennial allergic rhinitis - Continue with Nasacort 1-2 sprays per nostril up to twice daily as needed.  - Continue with cetirizine 10mg  daily. - Continue with montelukast (Singulair) 10mg  daily.   3. Return in about 6 months (around 03/08/2020). This can be an in-person, a virtual Webex or a telephone follow up visit.   Please inform us of any Emergency Department visits, hospitalizations, or changes in symptoms. Call us before going to the ED for breathing or allergy symptoms since we might be able to fit you in for a sick visit. Feel free to contact us anytime with any questions, problems, or concerns.  It was a pleasure to see you again today!  Websites that have reliable patient information: 1. American Academy of Asthma, Allergy, and Immunology: www.aaaai.org 2. Food Allergy Research and Education (FARE): foodallergy.org 3. Mothers of Asthmatics: http://www.asthmacommunitynetwork.org 4. American College of Allergy, Asthma, and Immunology: www.acaai.org   COVID-19 Vaccine  Information can be found at: ShippingScam.co.uk For questions related to vaccine distribution or appointments, please email vaccine@[email or web address] .com or call 2238558322.     "Like" [place] on Facebook and Instagram for our latest updates!        Make sure you are registered to vote! If you have moved or changed any of your contact information, you will need to get this updated before voting!  In some cases, you MAY be able to register to vote online: [email or web address]

## 2019-09-13 ENCOUNTER — Other Ambulatory Visit: Payer: Self-pay | Admitting: Family Medicine

## 2019-09-13 ENCOUNTER — Ambulatory Visit: Payer: Medicare Other | Admitting: Allergy & Immunology

## 2019-09-13 DIAGNOSIS — R921 Mammographic calcification found on diagnostic imaging of breast: Secondary | ICD-10-CM

## 2019-09-19 ENCOUNTER — Ambulatory Visit
Admission: RE | Admit: 2019-09-19 | Discharge: 2019-09-19 | Disposition: A | Discharge status: Home / Self Care | Payer: Medicare Other | Source: Ambulatory Visit | Attending: Family Medicine | Admitting: Family Medicine

## 2019-09-19 ENCOUNTER — Other Ambulatory Visit: Payer: Self-pay

## 2019-09-19 DIAGNOSIS — R921 Mammographic calcification found on diagnostic imaging of breast: Secondary | ICD-10-CM

## 2019-09-24 DIAGNOSIS — C801 Malignant (primary) neoplasm, unspecified: Secondary | ICD-10-CM

## 2019-09-24 HISTORY — DX: Malignant (primary) neoplasm, unspecified: C80.1

## 2019-09-27 ENCOUNTER — Encounter: Payer: Self-pay | Admitting: Adult Health

## 2019-09-27 DIAGNOSIS — C50412 Malignant neoplasm of upper-outer quadrant of left female breast: Secondary | ICD-10-CM | POA: Insufficient documentation

## 2019-10-02 ENCOUNTER — Other Ambulatory Visit: Payer: Self-pay | Admitting: Surgery

## 2019-10-02 DIAGNOSIS — D0512 Intraductal carcinoma in situ of left breast: Secondary | ICD-10-CM

## 2019-10-03 DIAGNOSIS — J4550 Severe persistent asthma, uncomplicated: Secondary | ICD-10-CM | POA: Diagnosis not present

## 2019-10-04 ENCOUNTER — Ambulatory Visit (INDEPENDENT_AMBULATORY_CARE_PROVIDER_SITE_OTHER): Payer: Medicare Other

## 2019-10-04 DIAGNOSIS — J4550 Severe persistent asthma, uncomplicated: Secondary | ICD-10-CM

## 2019-10-04 DIAGNOSIS — J4540 Moderate persistent asthma, uncomplicated: Secondary | ICD-10-CM

## 2019-10-05 ENCOUNTER — Other Ambulatory Visit: Payer: Self-pay | Admitting: Surgery

## 2019-10-05 DIAGNOSIS — D0512 Intraductal carcinoma in situ of left breast: Secondary | ICD-10-CM

## 2019-10-06 ENCOUNTER — Other Ambulatory Visit: Payer: Self-pay

## 2019-10-23 ENCOUNTER — Ambulatory Visit: Payer: Medicare Other | Attending: Surgery | Admitting: Rehabilitation

## 2019-10-23 ENCOUNTER — Other Ambulatory Visit: Payer: Self-pay

## 2019-10-23 ENCOUNTER — Encounter: Payer: Self-pay | Admitting: Rehabilitation

## 2019-10-23 DIAGNOSIS — R293 Abnormal posture: Secondary | ICD-10-CM | POA: Insufficient documentation

## 2019-10-23 NOTE — Patient Instructions (Signed)

## 2019-10-23 NOTE — Therapy (Signed)
[place], [place], [address] Phone: [phone]   Fax:  [phone]  Physical Therapy Evaluation  Patient Details  Name: Alicia Hall MRN: [id number] Date of Birth: 1956/02/17 Referring Provider (PT): Dr. Ninfa Linden   Encounter Date: 10/23/2019   PT End of Session - 10/23/19 1043    Visit Number 1    Number of Visits 2    Date for PT Re-Evaluation 11/22/19    PT Start Time 1000    PT Stop Time 1029    PT Time Calculation (min) 29 min    Activity Tolerance Patient tolerated treatment well    Behavior During Therapy [facility] for tasks assessed/performed           Past Medical History:  Diagnosis Date  . Asthma   . COPD (chronic obstructive pulmonary disease) ([place])   . GERD (gastroesophageal reflux disease)   . Hypertension   . OSA (obstructive sleep apnea)    on CPAP  . Seasonal allergies     Past Surgical History:  Procedure Laterality Date  . COLONOSCOPY N/A 01/31/2015   RMR: Multiple colonic polyps treated/removed as described above  . ESOPHAGOGASTRODUODENOSCOPY N/A 01/31/2015   RMR: mild changes of reflux esophagitis. Tiny hiatal hernia. Status post passage of a Maloney dilator.   Marland Kitchen MALONEY DILATION N/A 01/31/2015   Procedure: Venia Minks DILATION;  Surgeon: Daneil Dolin, MD;  Location: [facility];  Service: Endoscopy;  Laterality: N/A;    There were no vitals filed for this visit.    Subjective Assessment - 10/23/19 0951    Subjective My Right shoulder and neck bother me I normally go to a chiropractor due to falling off the porch.    Pertinent History DCIS Lt breast Lumpectomy and SLNB scheduled for 11/01/19 with Dr. Ninfa Linden.  Oncologist care will be at [facility] with Dr. Federico Flake. Other history includes COPD, HTN, asthma, mild emphysema    Patient Stated Goals get information for all providers    Currently in Pain? No/denies              [facility] PT Assessment -  10/23/19 0001      Assessment   Medical Diagnosis Lt breast cancer    Referring Provider (PT) Dr. Ninfa Linden    Onset Date/Surgical Date 11/01/19    Hand Dominance Right    Prior Therapy no      Precautions   Precaution Comments active cancer      Restrictions   Weight Bearing Restrictions No      Balance Screen   Has the patient fallen in the past 6 months Yes    How many times? 1   off the [place] residence    Living Arrangements Alone    Available Help at Discharge Family      Prior Function   Level of [place] Retired    Leisure not anything now      Charity fundraiser Status Within Functional Limits for tasks assessed      Coordination   Gross Motor Movements are Fluid and Coordinated Yes      Posture/Postural Control   Posture/Postural Control Postural limitations    Postural Limitations Rounded Shoulders;Forward head      ROM / Strength   AROM / PROM / Strength AROM      AROM   AROM Assessment Site Shoulder  Right/Left Shoulder Right;Left    Right Shoulder Flexion 140 Degrees    Left Shoulder Extension 45 Degrees    Left Shoulder Flexion 145 Degrees    Left Shoulder ABduction 150 Degrees    Left Shoulder Internal Rotation 75 Degrees    Left Shoulder External Rotation 80 Degrees             LYMPHEDEMA/ONCOLOGY QUESTIONNAIRE - 10/23/19 0001      Type   Cancer Type Lt breast cancer      Lymphedema Assessments   Lymphedema Assessments Upper extremities      Right Upper Extremity Lymphedema   10 cm Proximal to Olecranon Process 34.5 cm    Olecranon Process 28.9 cm    10 cm Proximal to Ulnar Styloid Process 23.7 cm    Just Proximal to Ulnar Styloid Process 16.7 cm    Across Hand at [employer] 20.2 cm    At [place] of 2nd Digit 6.5 cm      Left Upper Extremity Lymphedema   10 cm Proximal to Olecranon Process 34.2 cm    Olecranon Process 28.5 cm    10 cm  Proximal to Ulnar Styloid Process 23.5 cm    Just Proximal to Ulnar Styloid Process 16 cm    Across Hand at [employer] 20.5 cm    At [place] of 2nd Digit 6.2 cm           L-DEX FLOWSHEETS - 10/23/19 1000      L-DEX LYMPHEDEMA SCREENING   Measurement Type Unilateral    L-DEX MEASUREMENT EXTREMITY Upper Extremity    POSITION  Standing    DOMINANT SIDE Right    At Risk Side Left    BASELINE SCORE (UNILATERAL) -9.3    L-DEX SCORE (UNILATERAL) -9.3    VALUE CHANGE (UNILAT) 0                  Objective measurements completed on examination: See above findings.               PT Education - 10/23/19 1042    Education Details post op exercises, lymphedema surveillance and briefly plhysiology    Person(s) Educated Patient    Methods Explanation;Demonstration;Handout    Comprehension Verbalized understanding;Returned demonstration                Breast Clinic Goals - 10/23/19 1046      Patient will be able to verbalize understanding of pertinent lymphedema risk reduction practices relevant to her diagnosis specifically related to skin care.   Status Achieved      Patient will be able to return demonstrate and/or verbalize understanding of the post-op home exercise program related to regaining shoulder range of motion.   Status Achieved      Patient will be able to verbalize understanding of the importance of attending the postoperative After Breast Cancer Class for further lymphedema risk reduction education and therapeutic exercise.   Status Achieved                 Plan - 10/23/19 1043    Clinical Impression Statement Pt presents pre lumpectomy and SLNB due to Lt breast cancer.  Baseline AROM and circumferential measurements were taken as well as L-DEX anaylsis demonstrating already in the yellow at -9.3.  Pt was educated on post op exercises and future lymphedema surveillance    Personal Factors and Comorbidities Age;Comorbidity 2     Comorbidities COPD, emphysema    Stability/Clinical Decision Making Stable/Uncomplicated  Clinical Decision Making Low    Rehab Potential Excellent    PT Frequency Other (comment)   post op check and more visits PRN   PT Duration 4 weeks    PT Treatment/Interventions ADLs/Self Care Home Management;Therapeutic exercise;Patient/family education;Manual techniques    PT Next Visit Plan post op check, schedule [facility] follow up    Recommended Other Services ABC class    Consulted and Agree with Plan of Care Patient           Patient will benefit from skilled therapeutic intervention in order to improve the following deficits and impairments:  Postural dysfunction  Visit Diagnosis: Abnormal posture     Problem List Patient Active Problem List   Diagnosis Date Noted  . Malignant neoplasm of upper-outer quadrant of left breast in female, estrogen receptor positive ([place]) 09/27/2019  . Pulmonary nodules 09/22/2016  . Asthma-COPD overlap syndrome ([place]) 07/28/2016  . COPD, moderate ([place]) 07/05/2015  . Hypersomnia with sleep apnea 06/19/2015  . OSA (obstructive sleep apnea) 06/19/2015  . Depression 06/19/2015  . Insomnia 06/19/2015  . Lung nodule 03/08/2015  . History of colonic polyps   . GERD (gastroesophageal reflux disease) 01/25/2015  . Dysphagia 01/25/2015  . Encounter for screening colonoscopy 01/25/2015  . COPD (chronic obstructive pulmonary disease) ([place]) 12/18/2014  . Moderate persistent asthma 11/23/2014  . Dyspnea and respiratory abnormality 11/23/2014  . Smoking history 11/23/2014  . Upper airway cough syndrome 11/23/2014  . Perennial allergic rhinitis 11/15/2014    Shan Levans, PT 10/23/2019, 10:47 AM  [place], [place], [address] Phone: [phone]   Fax:  [phone]  Name: NAIROBI GUSTAFSON MRN: [id number] Date of Birth: [birth year]

## 2019-10-25 ENCOUNTER — Encounter (HOSPITAL_BASED_OUTPATIENT_CLINIC_OR_DEPARTMENT_OTHER): Payer: Self-pay | Admitting: Surgery

## 2019-10-25 ENCOUNTER — Other Ambulatory Visit: Payer: Self-pay

## 2019-10-26 DIAGNOSIS — J4550 Severe persistent asthma, uncomplicated: Secondary | ICD-10-CM | POA: Diagnosis not present

## 2019-10-27 ENCOUNTER — Ambulatory Visit (INDEPENDENT_AMBULATORY_CARE_PROVIDER_SITE_OTHER): Payer: Medicare Other

## 2019-10-27 ENCOUNTER — Other Ambulatory Visit: Payer: Self-pay

## 2019-10-27 DIAGNOSIS — J4550 Severe persistent asthma, uncomplicated: Secondary | ICD-10-CM

## 2019-10-27 DIAGNOSIS — J4540 Moderate persistent asthma, uncomplicated: Secondary | ICD-10-CM

## 2019-10-28 ENCOUNTER — Inpatient Hospital Stay (HOSPITAL_COMMUNITY): Admission: RE | Admit: 2019-10-28 | Payer: Medicare Other | Source: Ambulatory Visit

## 2019-10-31 ENCOUNTER — Ambulatory Visit: Payer: Medicare Other

## 2019-10-31 ENCOUNTER — Encounter (HOSPITAL_BASED_OUTPATIENT_CLINIC_OR_DEPARTMENT_OTHER)
Admission: RE | Admit: 2019-10-31 | Discharge: 2019-10-31 | Disposition: A | Discharge status: Home / Self Care | Payer: Medicare Other | Source: Ambulatory Visit | Attending: Surgery | Admitting: Surgery

## 2019-10-31 ENCOUNTER — Other Ambulatory Visit (HOSPITAL_COMMUNITY)
Admission: RE | Admit: 2019-10-31 | Discharge: 2019-10-31 | Disposition: A | Discharge status: Home / Self Care | Payer: Medicare Other | Source: Ambulatory Visit | Attending: Surgery | Admitting: Surgery

## 2019-10-31 DIAGNOSIS — Z20822 Contact with and (suspected) exposure to covid-19: Secondary | ICD-10-CM | POA: Insufficient documentation

## 2019-10-31 DIAGNOSIS — Z01812 Encounter for preprocedural laboratory examination: Secondary | ICD-10-CM | POA: Insufficient documentation

## 2019-10-31 LAB — BASIC METABOLIC PANEL
Anion gap: 8 (ref 5–15)
BUN: 14 mg/dL (ref 8–23)
CO2: 27 mmol/L (ref 22–32)
Calcium: 9.4 mg/dL (ref 8.9–10.3)
Chloride: 105 mmol/L (ref 98–111)
Creatinine, Ser: 0.63 mg/dL (ref 0.44–1.00)
GFR calc Af Amer: >60 mL/min (ref >60)
GFR calc non Af Amer: >60 mL/min (ref >60)
Glucose, Bld: 91 mg/dL (ref 70–99)
Potassium: 4.2 mmol/L (ref 3.5–5.1)
Sodium: 140 mmol/L (ref 135–145)

## 2019-10-31 LAB — SARS CORONAVIRUS 2 (TAT 6-24 HRS): SARS Coronavirus 2: NEGATIVE

## 2019-10-31 MED ORDER — ENSURE PRE-SURGERY PO LIQD: 296.0000 mL | Freq: Once | ORAL | Status: DC

## 2019-10-31 NOTE — Progress Notes (Signed)

## 2019-10-31 NOTE — Progress Notes (Signed)
EKG reviewed with Dr. Miller. 

## 2019-10-31 NOTE — Progress Notes (Signed)
Alicia Hall, surgical scheduler at [facility] made aware that pt's covid test will not be resulted before pt's seed placement this afternoon. Jackelyn Poling stated that she was already aware and that arrangements were being made for pt.   Jacqlyn Larsen, RN

## 2019-11-01 ENCOUNTER — Ambulatory Visit: Payer: Medicare Other

## 2019-11-01 ENCOUNTER — Other Ambulatory Visit: Payer: Self-pay | Admitting: Diagnostic Radiology

## 2019-11-01 ENCOUNTER — Other Ambulatory Visit: Payer: Self-pay | Admitting: Endocrinology

## 2019-11-16 ENCOUNTER — Other Ambulatory Visit: Payer: Self-pay

## 2019-11-16 ENCOUNTER — Encounter (HOSPITAL_BASED_OUTPATIENT_CLINIC_OR_DEPARTMENT_OTHER): Payer: Self-pay | Admitting: Surgery

## 2019-11-18 ENCOUNTER — Other Ambulatory Visit (HOSPITAL_COMMUNITY)
Admission: RE | Admit: 2019-11-18 | Discharge: 2019-11-18 | Disposition: A | Discharge status: Home / Self Care | Payer: Medicare Other | Source: Ambulatory Visit | Attending: Surgery | Admitting: Surgery

## 2019-11-18 DIAGNOSIS — Z01812 Encounter for preprocedural laboratory examination: Secondary | ICD-10-CM | POA: Diagnosis present

## 2019-11-18 DIAGNOSIS — Z20822 Contact with and (suspected) exposure to covid-19: Secondary | ICD-10-CM | POA: Insufficient documentation

## 2019-11-18 LAB — SARS CORONAVIRUS 2 (TAT 6-24 HRS): SARS Coronavirus 2: NEGATIVE

## 2019-11-21 ENCOUNTER — Other Ambulatory Visit: Payer: Self-pay

## 2019-11-21 ENCOUNTER — Encounter: Payer: Self-pay | Admitting: Rehabilitation

## 2019-11-21 ENCOUNTER — Ambulatory Visit
Admission: RE | Admit: 2019-11-21 | Discharge: 2019-11-21 | Disposition: A | Discharge status: Home / Self Care | Payer: Medicare Other | Source: Ambulatory Visit | Attending: Surgery | Admitting: Surgery

## 2019-11-21 DIAGNOSIS — D0512 Intraductal carcinoma in situ of left breast: Secondary | ICD-10-CM

## 2019-11-21 NOTE — H&P (Signed)
Alicia Hall  Location: [facility] Patient #: [id number] DOB: [birth year] Widowed / Language: [name] / Race: White Female   History of Present Illness The patient is a 63 year old female who presents with breast cancer.  Chief complaint: Left breast DCIS  This is a 64 year old female who presents with a recent diagnosis of ductal carcinoma in situ of the left breast. She had undergone a screening mammogram showing abnormal calcifications in the upper outer quadrant of the left breast. These measured 1.1 cm. She underwent a biopsy showing ductal carcinoma in situ was 90% ER positive, PR negative. She has no previous history of breast problems or breast cancer. There is no family history of breast cancer. She denies nipple discharge. She has no previous surgical history. She is otherwise healthy without complaints.   Past Surgical History  Colon Polyp Removal - Colonoscopy   Diagnostic Studies History  Colonoscopy  1-5 years ago Mammogram  within last year Pap Smear  1-5 years ago  Allergies No Known Drug Allergies  Allergies Reconciled   Medication History (Alicia Hall, CMA; Losartan Potassium-HCTZ (100-12.5MG  Tablet, Oral) Active. Montelukast Sodium (10MG  Tablet, Oral) Active. Famotidine (40MG  Tablet, Oral) Active. Symbicort (160-4.5MCG/ACT Aerosol, Inhalation) Active. Sertraline HCl (100MG  Tablet, Oral) Active. amLODIPine Besylate (10MG  Tablet, Oral) Active. Nucala (100MG /ML Soln Auto-inj, Subcutaneous) Active. Medications Reconciled  Social History Alcohol use  Occasional alcohol use. Caffeine use  Carbonated beverages, Coffee, Tea. No drug use  Tobacco use  Former smoker.  Family History  Arthritis  Father, Mother. Cancer  Brother. Cerebrovascular Accident  Brother. Diabetes Mellitus  Brother, Father. Hypertension  Brother, Father. Respiratory Condition  Mother.  Pregnancy / Birth History Leisure centre manager, [name]; Age  at menarche  39 years. Gravida  2 Irregular periods  Maternal age  23-20 Para  2  Other Problems ([name], CMA;  Anxiety Disorder  Arthritis  Asthma  Back Pain  Breast Cancer  Chronic Obstructive Lung Disease  Depression  Emphysema Of Lung  Gastroesophageal Reflux Disease  High blood pressure  Sleep Apnea     Review of Systems General Not Present- Appetite Loss, Chills, Fatigue, Fever, Night Sweats, Weight Gain and Weight Loss. Skin Not Present- Change in Wart/Mole, Dryness, Hives, Jaundice, New Lesions, Non-Healing Wounds, Rash and Ulcer. HEENT Present- Seasonal Allergies and Wears glasses/contact lenses. Not Present- Earache, Hearing Loss, Hoarseness, Nose Bleed, Oral Ulcers, Ringing in the Ears, Sinus Pain, Sore Throat, Visual Disturbances and Yellow Eyes. Breast Not Present- Breast Mass, Breast Pain, Nipple Discharge and Skin Changes. Cardiovascular Not Present- Chest Pain, Difficulty Breathing Lying Down, Leg Cramps, Palpitations, Rapid Heart Rate, Shortness of Breath and Swelling of Extremities. Gastrointestinal Not Present- Abdominal Pain, Bloating, Bloody Stool, Change in Bowel Habits, Chronic diarrhea, Constipation, Difficulty Swallowing, Excessive gas, Gets full quickly at meals, Hemorrhoids, Indigestion, Nausea, Rectal Pain and Vomiting. Musculoskeletal Present- Joint Pain. Not Present- Back Pain, Joint Stiffness, Muscle Pain, Muscle Weakness and Swelling of Extremities. Neurological Not Present- Decreased Memory, Fainting, Headaches, Numbness, Seizures, Tingling, Tremor, Trouble walking and Weakness. Psychiatric Present- Anxiety, Change in Sleep Pattern and Depression. Not Present- Bipolar, Fearful and Frequent crying. Endocrine Present- Hot flashes. Not Present- Cold Intolerance, Excessive Hunger, Hair Changes, Heat Intolerance and New Diabetes. Hematology Present- Easy Bruising. Not Present- Blood Thinners, Excessive bleeding, Gland problems, HIV and  Persistent Infections.  Vitals   Weight: 209.38 lb Height: 63in Body Surface Area: 1.97 m Body Mass Index: 37.09 kg/m  Temp.: 98.58F  Pulse: 96 (Regular)  Physical Exam  The physical exam findings are as follows: Note: She appears well on exam.  Breast for normal appearance with normal nipple areolar complexes. Her biopsy sites are well healed in the left breast. There are no palpable breast masses in either side.  There is no axillary adenopathy.    Assessment & Plan  DUCTAL CARCINOMA IN SITU (DCIS) OF LEFT BREAST (D05.12)  Impression: This is a patient with ductal carcinoma in situ of the left breast. I have reviewed her mammograms and biopsy results. I gave the family copy of the biopsy results. We then discussed her diagnosis of left breast duct carcinoma in situ. We discussed both breast conservation with lumpectomy and postoperative radiation versus mastectomy. She was to proceed with breast conservation. I then discussed proceeding with a radioactive seed guided left breast lumpectomy. I described the procedure in detail. We discussed surgical risks. These include but are not limited to bleeding, infection, need for further surgery if the margins are positive, cardiopulmonary issues, postoperative recovery, etc. He also discussed referral to the medical and radiation oncologist for postoperative treatment. All questions were answered. She agrees to proceed with surgery which will be scheduled.

## 2019-11-22 ENCOUNTER — Ambulatory Visit (HOSPITAL_BASED_OUTPATIENT_CLINIC_OR_DEPARTMENT_OTHER)
Admission: RE | Admit: 2019-11-22 | Discharge: 2019-11-22 | Disposition: A | Discharge status: Home / Self Care | Payer: Medicare Other | Attending: Surgery | Admitting: Surgery

## 2019-11-22 ENCOUNTER — Ambulatory Visit (HOSPITAL_BASED_OUTPATIENT_CLINIC_OR_DEPARTMENT_OTHER): Payer: Medicare Other | Admitting: Certified Registered"

## 2019-11-22 ENCOUNTER — Ambulatory Visit
Admission: RE | Admit: 2019-11-22 | Discharge: 2019-11-22 | Disposition: A | Discharge status: Home / Self Care | Payer: Medicare Other | Source: Ambulatory Visit | Attending: Surgery | Admitting: Surgery

## 2019-11-22 ENCOUNTER — Other Ambulatory Visit: Payer: Self-pay

## 2019-11-22 ENCOUNTER — Encounter (HOSPITAL_BASED_OUTPATIENT_CLINIC_OR_DEPARTMENT_OTHER)
Admission: RE | Disposition: A | Discharge status: Home / Self Care | Payer: Self-pay | Source: Home / Self Care | Attending: Surgery

## 2019-11-22 DIAGNOSIS — K219 Gastro-esophageal reflux disease without esophagitis: Secondary | ICD-10-CM | POA: Diagnosis not present

## 2019-11-22 DIAGNOSIS — J449 Chronic obstructive pulmonary disease, unspecified: Secondary | ICD-10-CM | POA: Insufficient documentation

## 2019-11-22 DIAGNOSIS — Z170 Estrogen receptor positive status [ER+]: Secondary | ICD-10-CM | POA: Insufficient documentation

## 2019-11-22 DIAGNOSIS — Z8261 Family history of arthritis: Secondary | ICD-10-CM | POA: Insufficient documentation

## 2019-11-22 DIAGNOSIS — F419 Anxiety disorder, unspecified: Secondary | ICD-10-CM | POA: Diagnosis not present

## 2019-11-22 DIAGNOSIS — Z79899 Other long term (current) drug therapy: Secondary | ICD-10-CM | POA: Insufficient documentation

## 2019-11-22 DIAGNOSIS — D0512 Intraductal carcinoma in situ of left breast: Secondary | ICD-10-CM | POA: Diagnosis not present

## 2019-11-22 DIAGNOSIS — I10 Essential (primary) hypertension: Secondary | ICD-10-CM | POA: Insufficient documentation

## 2019-11-22 DIAGNOSIS — Z87891 Personal history of nicotine dependence: Secondary | ICD-10-CM | POA: Diagnosis not present

## 2019-11-22 DIAGNOSIS — Z7951 Long term (current) use of inhaled steroids: Secondary | ICD-10-CM | POA: Insufficient documentation

## 2019-11-22 DIAGNOSIS — G4730 Sleep apnea, unspecified: Secondary | ICD-10-CM | POA: Diagnosis not present

## 2019-11-22 DIAGNOSIS — F329 Major depressive disorder, single episode, unspecified: Secondary | ICD-10-CM | POA: Insufficient documentation

## 2019-11-22 HISTORY — PX: BREAST LUMPECTOMY WITH RADIOACTIVE SEED LOCALIZATION: SHX6424

## 2019-11-22 HISTORY — DX: Anxiety disorder, unspecified: F41.9

## 2019-11-22 HISTORY — DX: Depression, unspecified: F32.A

## 2019-11-22 HISTORY — DX: Unspecified osteoarthritis, unspecified site: M19.90

## 2019-11-22 SURGERY — BREAST LUMPECTOMY WITH RADIOACTIVE SEED LOCALIZATION
Anesthesia: General | Site: Breast | Laterality: Left

## 2019-11-22 MED ORDER — MIDAZOLAM HCL 2 MG/2ML IJ SOLN
INTRAMUSCULAR | Status: AC
  Filled 2019-11-22: qty 2

## 2019-11-22 MED ORDER — FENTANYL CITRATE (PF) 100 MCG/2ML IJ SOLN
INTRAMUSCULAR | Status: DC | PRN
Start: 2019-11-22 — End: 2019-11-22
  Administered 2019-11-22 (×2): 25 ug via INTRAVENOUS

## 2019-11-22 MED ORDER — GABAPENTIN 300 MG PO CAPS
300.0000 mg | ORAL_CAPSULE | ORAL | Status: AC
  Administered 2019-11-22: 300 mg via ORAL

## 2019-11-22 MED ORDER — DEXAMETHASONE SODIUM PHOSPHATE 10 MG/ML IJ SOLN
INTRAMUSCULAR | Status: DC | PRN
  Administered 2019-11-22: 5 mg via INTRAVENOUS

## 2019-11-22 MED ORDER — LIDOCAINE 2% (20 MG/ML) 5 ML SYRINGE
INTRAMUSCULAR | Status: AC
  Filled 2019-11-22: qty 5

## 2019-11-22 MED ORDER — CEFAZOLIN SODIUM-DEXTROSE 2-4 GM/100ML-% IV SOLN
2.0000 g | INTRAVENOUS | Status: AC
  Administered 2019-11-22: 2 g via INTRAVENOUS

## 2019-11-22 MED ORDER — ONDANSETRON HCL 4 MG/2ML IJ SOLN
INTRAMUSCULAR | Status: DC | PRN
  Administered 2019-11-22: 4 mg via INTRAVENOUS

## 2019-11-22 MED ORDER — OXYCODONE HCL 5 MG PO TABS: 5.0000 mg | ORAL_TABLET | Freq: Once | ORAL | Status: DC | PRN

## 2019-11-22 MED ORDER — ACETAMINOPHEN 500 MG PO TABS
ORAL_TABLET | ORAL | Status: AC
  Filled 2019-11-22: qty 2

## 2019-11-22 MED ORDER — LIDOCAINE HCL (CARDIAC) PF 100 MG/5ML IV SOSY
PREFILLED_SYRINGE | INTRAVENOUS | Status: DC | PRN
  Administered 2019-11-22: 60 mg via INTRAVENOUS

## 2019-11-22 MED ORDER — ONDANSETRON HCL 4 MG/2ML IJ SOLN: 4.0000 mg | Freq: Once | INTRAMUSCULAR | Status: DC | PRN

## 2019-11-22 MED ORDER — ACETAMINOPHEN 325 MG PO TABS: 325.0000 mg | ORAL_TABLET | ORAL | Status: DC | PRN

## 2019-11-22 MED ORDER — CEFAZOLIN SODIUM-DEXTROSE 2-4 GM/100ML-% IV SOLN
INTRAVENOUS | Status: AC
  Filled 2019-11-22: qty 100

## 2019-11-22 MED ORDER — ACETAMINOPHEN 500 MG PO TABS
1000.0000 mg | ORAL_TABLET | ORAL | Status: AC
  Administered 2019-11-22: 1000 mg via ORAL

## 2019-11-22 MED ORDER — TRAMADOL HCL 50 MG PO TABS: 50.0000 mg | ORAL_TABLET | Freq: Four times a day (QID) | ORAL | 0 refills | Status: DC | PRN

## 2019-11-22 MED ORDER — ONDANSETRON HCL 4 MG/2ML IJ SOLN
INTRAMUSCULAR | Status: AC
  Filled 2019-11-22: qty 2

## 2019-11-22 MED ORDER — BUPIVACAINE HCL (PF) 0.25 % IJ SOLN
INTRAMUSCULAR | Status: DC | PRN
  Administered 2019-11-22: 20 mL

## 2019-11-22 MED ORDER — CHLORHEXIDINE GLUCONATE CLOTH 2 % EX PADS: 6.0000 | MEDICATED_PAD | Freq: Once | CUTANEOUS | Status: DC

## 2019-11-22 MED ORDER — MEPERIDINE HCL 25 MG/ML IJ SOLN: 6.2500 mg | INTRAMUSCULAR | Status: DC | PRN

## 2019-11-22 MED ORDER — FENTANYL CITRATE (PF) 100 MCG/2ML IJ SOLN
INTRAMUSCULAR | Status: AC
  Filled 2019-11-22: qty 2

## 2019-11-22 MED ORDER — LACTATED RINGERS IV SOLN: INTRAVENOUS | Status: DC

## 2019-11-22 MED ORDER — GABAPENTIN 300 MG PO CAPS
ORAL_CAPSULE | ORAL | Status: AC
  Filled 2019-11-22: qty 1

## 2019-11-22 MED ORDER — ACETAMINOPHEN 160 MG/5ML PO SOLN: 325.0000 mg | ORAL | Status: DC | PRN

## 2019-11-22 MED ORDER — FENTANYL CITRATE (PF) 100 MCG/2ML IJ SOLN: 25.0000 ug | INTRAMUSCULAR | Status: DC | PRN

## 2019-11-22 MED ORDER — MIDAZOLAM HCL 5 MG/5ML IJ SOLN
INTRAMUSCULAR | Status: DC | PRN
  Administered 2019-11-22: 2 mg via INTRAVENOUS

## 2019-11-22 MED ORDER — OXYCODONE HCL 5 MG/5ML PO SOLN: 5.0000 mg | Freq: Once | ORAL | Status: DC | PRN

## 2019-11-22 MED ORDER — PROPOFOL 10 MG/ML IV BOLUS
INTRAVENOUS | Status: DC | PRN
  Administered 2019-11-22: 150 mg via INTRAVENOUS

## 2019-11-22 SURGICAL SUPPLY — 49 items
ADH SKN CLS APL DERMABOND .7 (GAUZE/BANDAGES/DRESSINGS) ×1
APL PRP STRL LF DISP 70% ISPRP (MISCELLANEOUS) ×1
APPLIER CLIP 9.375 MED OPEN (MISCELLANEOUS) ×2
APR CLP MED 9.3 20 MLT OPN (MISCELLANEOUS) ×1
BINDER BREAST 3XL (GAUZE/BANDAGES/DRESSINGS) IMPLANT
BINDER BREAST LRG (GAUZE/BANDAGES/DRESSINGS) IMPLANT
BINDER BREAST MEDIUM (GAUZE/BANDAGES/DRESSINGS) IMPLANT
BINDER BREAST XLRG (GAUZE/BANDAGES/DRESSINGS) IMPLANT
BINDER BREAST XXLRG (GAUZE/BANDAGES/DRESSINGS) ×1 IMPLANT
BLADE SURG 15 STRL LF DISP TIS (BLADE) ×1 IMPLANT
BLADE SURG 15 STRL SS (BLADE) ×2
CANISTER SUC SOCK COL 7IN (MISCELLANEOUS) IMPLANT
CANISTER SUCT 1200ML W/VALVE (MISCELLANEOUS) IMPLANT
CHLORAPREP W/TINT 26 (MISCELLANEOUS) ×2 IMPLANT
CLIP APPLIE 9.375 MED OPEN (MISCELLANEOUS) IMPLANT
COVER BACK TABLE 60X90IN (DRAPES) ×2 IMPLANT
COVER MAYO STAND STRL (DRAPES) ×2 IMPLANT
COVER PROBE W GEL 5X96 (DRAPES) ×2 IMPLANT
COVER WAND RF STERILE (DRAPES) IMPLANT
DECANTER SPIKE VIAL GLASS SM (MISCELLANEOUS) IMPLANT
DERMABOND ADVANCED (GAUZE/BANDAGES/DRESSINGS) ×1
DERMABOND ADVANCED .7 DNX12 (GAUZE/BANDAGES/DRESSINGS) ×1 IMPLANT
DRAPE LAPAROSCOPIC ABDOMINAL (DRAPES) ×2 IMPLANT
DRAPE UTILITY XL STRL (DRAPES) ×2 IMPLANT
ELECT REM PT RETURN 9FT ADLT (ELECTROSURGICAL) ×2
ELECTRODE REM PT RTRN 9FT ADLT (ELECTROSURGICAL) ×1 IMPLANT
GAUZE SPONGE 4X4 12PLY STRL LF (GAUZE/BANDAGES/DRESSINGS) IMPLANT
GLOVE SURG SIGNA 7.5 PF LTX (GLOVE) ×2 IMPLANT
GOWN STRL REUS W/ TWL LRG LVL3 (GOWN DISPOSABLE) ×1 IMPLANT
GOWN STRL REUS W/ TWL XL LVL3 (GOWN DISPOSABLE) ×1 IMPLANT
GOWN STRL REUS W/TWL LRG LVL3 (GOWN DISPOSABLE) ×2
GOWN STRL REUS W/TWL XL LVL3 (GOWN DISPOSABLE) ×2
KIT MARKER MARGIN INK (KITS) ×2 IMPLANT
NDL HYPO 25X1 1.5 SAFETY (NEEDLE) ×1 IMPLANT
NEEDLE HYPO 25X1 1.5 SAFETY (NEEDLE) ×2 IMPLANT
NS IRRIG 1000ML POUR BTL (IV SOLUTION) ×1 IMPLANT
PACK BASIN DAY SURGERY FS (CUSTOM PROCEDURE TRAY) ×2 IMPLANT
PENCIL SMOKE EVACUATOR (MISCELLANEOUS) ×2 IMPLANT
SLEEVE SCD COMPRESS KNEE MED (MISCELLANEOUS) ×2 IMPLANT
SPONGE LAP 4X18 RFD (DISPOSABLE) ×2 IMPLANT
SUT MNCRL AB 4-0 PS2 18 (SUTURE) ×2 IMPLANT
SUT SILK 2 0 SH (SUTURE) IMPLANT
SUT VIC AB 3-0 SH 27 (SUTURE) ×2
SUT VIC AB 3-0 SH 27X BRD (SUTURE) ×1 IMPLANT
SYR CONTROL 10ML LL (SYRINGE) ×2 IMPLANT
TOWEL GREEN STERILE FF (TOWEL DISPOSABLE) ×2 IMPLANT
TRAY FAXITRON CT DISP (TRAY / TRAY PROCEDURE) ×2 IMPLANT
TUBE CONNECTING 20X1/4 (TUBING) IMPLANT
YANKAUER SUCT BULB TIP NO VENT (SUCTIONS) IMPLANT

## 2019-11-22 NOTE — Discharge Instructions (Signed)
[place] Office Phone Number [phone]  BREAST BIOPSY/ PARTIAL MASTECTOMY: POST OP INSTRUCTIONS  Always review your discharge instruction sheet given to you by the facility where your surgery was performed.  IF YOU HAVE DISABILITY OR FAMILY LEAVE FORMS, YOU MUST BRING THEM TO THE OFFICE FOR PROCESSING.  DO NOT GIVE THEM TO YOUR DOCTOR.  1. A prescription for pain medication may be given to you upon discharge.  Take your pain medication as prescribed, if needed.  If narcotic pain medicine is not needed, then you may take acetaminophen (Tylenol) or ibuprofen (Advil) as needed. 2. Take your usually prescribed medications unless otherwise directed 3. If you need a refill on your pain medication, please contact your pharmacy.  They will contact our office to request authorization.  Prescriptions will not be filled after 5pm or on week-ends. 4. You should eat very light the first 24 hours after surgery, such as soup, crackers, pudding, etc.  Resume your normal diet the day after surgery. 5. Most patients will experience some swelling and bruising in the breast.  Ice packs and a good support bra will help.  Swelling and bruising can take several days to resolve.  6. It is common to experience some constipation if taking pain medication after surgery.  Increasing fluid intake and taking a stool softener will usually help or prevent this problem from occurring.  A mild laxative (Milk of Magnesia or Miralax) should be taken according to package directions if there are no bowel movements after 48 hours. 7. Unless discharge instructions indicate otherwise, you may remove your bandages 24-48 hours after surgery, and you may shower at that time.  You may have steri-strips (small skin tapes) in place directly over the incision.  These strips should be left on the skin for 7-10 days.  If your surgeon used skin glue on the incision, you may shower in 24 hours.  The glue will flake off over the  next 2-3 weeks.  Any sutures or staples will be removed at the office during your follow-up visit. 8. ACTIVITIES:  You may resume regular daily activities (gradually increasing) beginning the next day.  Wearing a good support bra or sports bra minimizes pain and swelling.  You may have sexual intercourse when it is comfortable. a. You may drive when you no longer are taking prescription pain medication, you can comfortably wear a seatbelt, and you can safely maneuver your car and apply brakes. b. RETURN TO WORK:  ______________________________________________________________________________________ 9. You should see your doctor in the office for a follow-up appointment approximately two weeks after your surgery.  Your doctor's nurse will typically make your follow-up appointment when she calls you with your pathology report.  Expect your pathology report 2-3 business days after your surgery.  You may call to check if you do not hear from [place] after three days. 10. OTHER INSTRUCTIONS:YOU MAY REMOVE THE BINDER AND SHOWER STARTING TOMORROW 11. ICE PACK, TYLENOL, AND IBUPROFEN ALSO FOR PAIN 12. NO VIGOROUS ACTIVITY FOR ONE WEEK _______________________________________________________________________________________________ _____________________________________________________________________________________________________________________________________ _____________________________________________________________________________________________________________________________________ _____________________________________________________________________________________________________________________________________  WHEN TO CALL YOUR DOCTOR: 1. Fever over 101.0 2. Nausea and/or vomiting. 3. Extreme swelling or bruising. 4. Continued bleeding from incision. 5. Increased pain, redness, or drainage from the incision.  The clinic staff is available to answer your questions during regular business hours.   Please don't hesitate to call and ask to speak to one of the nurses for clinical concerns.  If you have a medical emergency, go to the nearest emergency room or  call 911.  A surgeon from [facility] is always on call at the hospital.  For further questions, please visit [email or web address]   May take Tylenol after 8pm, if needed.

## 2019-11-22 NOTE — Op Note (Signed)
LEFT BREAST LUMPECTOMY WITH RADIOACTIVE SEED LOCALIZATION  Procedure Note  Alicia Hall 11/22/2019   Pre-op Diagnosis: LEFT BREAST DUCTAL CARCINOMA IN SITU     Post-op Diagnosis: same  Procedure(s): LEFT BREAST LUMPECTOMY WITH RADIOACTIVE SEED LOCALIZATION  Surgeon(s): Coralie Keens, MD Carlena Hurl, PA-C  Anesthesia: General  Staff:  Circulator: Carson Myrtle, RN Scrub Person: Burna Sis, RN  Estimated Blood Loss: Minimal               Specimens: sent to path  Indications: This is a 64 year old female who was found to have abnormal appearing calcifications in the left breast on screening mammography.  She underwent a stereotactic biopsy of these showing ductal carcinoma in situ.  The decision was made to proceed with a radioactive seed guided left breast lumpectomy  Procedure: The patient was brought to the operating room and identifies correct patient.  She is placed upon the operating table general anesthesia was induced.  Her left breast was then prepped and draped in usual sterile fashion.  I anesthetized skin of the lateral edge of the areola with Marcaine.  I then made a circumareolar incision with a scalpel.  I then dissected in the breast tissue with electrocautery.  With the aid of neoprobe I then dissected laterally toward the radioactive seed.  There was some puckering of the skin at the actual biopsy site.  Took the scar tissue down without violating the skin.  I then performed a wide lumpectomy staying around the radioactive seed.  As I elevated the lumpectomy specimen up out of the incision the radioactive seed came out and was thus placed in a cup and x-rayed separately.  I completed a lumpectomy with electrocautery.  I painted the margins with paint and then x-rayed the specimen.  The previous specimen clip which had migrated from the actual calcification site was in the specimen.  The lumpectomy specimen was then sent to pathology for evaluation.  I achieved  hemostasis with the cautery.  I placed surgical clips around the margins of the biopsy cavity.  I then closed the subcutaneous tissue with interrupted 3-0 Vicryl sutures and closed the skin with a running 4-0 Monocryl.  Dermabond was then applied.  The patient tolerated the procedure well.  All the counts were correct at the end of the procedure.  The patient was then extubated in the operating room and taken in stable condition to the recovery room.          Coralie Keens   Date: 11/22/2019  Time: 3:16 PM

## 2019-11-22 NOTE — Transfer of Care (Signed)
Immediate Anesthesia Transfer of Care Note  Patient: Alicia Hall [name]  Procedure(s) Performed: LEFT BREAST LUMPECTOMY WITH RADIOACTIVE SEED LOCALIZATION (Left Breast)  Patient Location: PACU  Anesthesia Type:General  Level of Consciousness: awake, alert  and oriented  Airway & Oxygen Therapy: Patient Spontanous Breathing and Patient connected to face mask oxygen  Post-op Assessment: Report given to RN and Post -op Vital signs reviewed and stable  Post vital signs: Reviewed and stable  Last Vitals:  Vitals Value Taken Time  BP    Temp    Pulse 71 11/22/19 1530  Resp 15 11/22/19 1530  SpO2 100 % 11/22/19 1530  Vitals shown include unvalidated device data.  Last Pain:  Vitals:   11/22/19 1336  TempSrc: Oral  PainSc: 0-No pain         Complications: No complications documented.

## 2019-11-22 NOTE — Anesthesia Preprocedure Evaluation (Addendum)
Anesthesia Evaluation  Patient identified by MRN, date of birth, ID band Patient awake    Reviewed: Patient's Chart, lab work & pertinent test results  Airway Mallampati: II  TM Distance: >3 FB Neck ROM: Full    Dental  (+) Teeth Intact   Pulmonary sleep apnea and Continuous Positive Airway Pressure Ventilation , COPD, former smoker,    Pulmonary exam normal        Cardiovascular hypertension, Pt. on medications  Rhythm:Regular Rate:Normal     Neuro/Psych Anxiety Depression negative neurological ROS     GI/Hepatic Neg liver ROS, GERD  Medicated,  Endo/Other  negative endocrine ROS  Renal/GU negative Renal ROS  negative genitourinary   Musculoskeletal  (+) Arthritis , Left breast ductal carcinoma   Abdominal (+)  Abdomen: soft. Bowel sounds: normal.  Peds  Hematology negative hematology ROS (+)   Anesthesia Other Findings   Reproductive/Obstetrics                            Anesthesia Physical Anesthesia Plan  ASA: II  Anesthesia Plan: General   Post-op Pain Management:    Induction:   PONV Risk Score and Plan: 3 and Ondansetron, Dexamethasone and Treatment may vary due to age or medical condition  Airway Management Planned: Mask and LMA  Additional Equipment: None  Intra-op Plan:   Post-operative Plan: Extubation in OR  Informed Consent: I have reviewed the patients History and Physical, chart, labs and discussed the procedure including the risks, benefits and alternatives for the proposed anesthesia with the patient or authorized representative who has indicated his/her understanding and acceptance.     Dental advisory given  Plan Discussed with: CRNA  Anesthesia Plan Comments: (Covid-19 Nucleic Acid Test Results Lab Results      Component                Value               Date                      [id number]              NEGATIVE            11/18/2019                 [place]              NEGATIVE            10/31/2019          )        Anesthesia Quick Evaluation

## 2019-11-22 NOTE — Anesthesia Postprocedure Evaluation (Signed)
Anesthesia Post Note  Patient: Alicia Hall  Procedure(s) Performed: LEFT BREAST LUMPECTOMY WITH RADIOACTIVE SEED LOCALIZATION (Left Breast)     Patient location during evaluation: PACU Anesthesia Type: General Level of consciousness: awake and alert Pain management: pain level controlled Vital Signs Assessment: post-procedure vital signs reviewed and stable Respiratory status: spontaneous breathing, nonlabored ventilation, respiratory function stable and patient connected to nasal cannula oxygen Cardiovascular status: blood pressure returned to baseline and stable Postop Assessment: no apparent nausea or vomiting Anesthetic complications: no   No complications documented.  Last Vitals:  Vitals:   11/22/19 1600 11/22/19 1645  BP: (!) 163/62 (!) 158/78  Pulse: 71 76  Resp: 15 16  Temp:  37.1 C  SpO2: 96% 95%    Last Pain:  Vitals:   11/22/19 1645  TempSrc: Oral  PainSc: 0-No pain                 Malisha Mabey

## 2019-11-22 NOTE — Anesthesia Procedure Notes (Signed)
Procedure Name: LMA Insertion Date/Time: 11/22/2019 2:42 PM Performed by: Lavonia Dana, CRNA Pre-anesthesia Checklist: Patient identified, Emergency Drugs available, Suction available and Patient being monitored Patient Re-evaluated:Patient Re-evaluated prior to induction Oxygen Delivery Method: Circle system utilized Preoxygenation: Pre-oxygenation with 100% oxygen Induction Type: IV induction Ventilation: Mask ventilation without difficulty LMA: LMA inserted LMA Size: 4.0 Number of attempts: 1 Airway Equipment and Method: Bite block Placement Confirmation: positive ETCO2 Tube secured with: Tape Dental Injury: Teeth and Oropharynx as per pre-operative assessment

## 2019-11-22 NOTE — Interval H&P Note (Signed)
History and Physical Interval Note: no change in H and P  11/22/2019 1:15 PM  Alicia Hall  has presented today for surgery, with the diagnosis of LEFT BREAST DUCTAL CARCINOMA IN SITU.  The various methods of treatment have been discussed with the patient and family. After consideration of risks, benefits and other options for treatment, the patient has consented to  Procedure(s): LEFT BREAST LUMPECTOMY WITH RADIOACTIVE SEED LOCALIZATION (Left) as a surgical intervention.  The patient's history has been reviewed, patient examined, no change in status, stable for surgery.  I have reviewed the patient's chart and labs.  Questions were answered to the patient's satisfaction.     Coralie Keens

## 2019-11-23 ENCOUNTER — Encounter (HOSPITAL_BASED_OUTPATIENT_CLINIC_OR_DEPARTMENT_OTHER): Payer: Self-pay | Admitting: Surgery

## 2019-11-24 ENCOUNTER — Ambulatory Visit: Payer: Self-pay

## 2019-11-25 LAB — SURGICAL PATHOLOGY

## 2019-11-28 DIAGNOSIS — J4550 Severe persistent asthma, uncomplicated: Secondary | ICD-10-CM | POA: Diagnosis not present

## 2019-11-29 ENCOUNTER — Ambulatory Visit (INDEPENDENT_AMBULATORY_CARE_PROVIDER_SITE_OTHER): Payer: Medicare Other

## 2019-11-29 ENCOUNTER — Other Ambulatory Visit: Payer: Self-pay

## 2019-11-29 DIAGNOSIS — J4540 Moderate persistent asthma, uncomplicated: Secondary | ICD-10-CM

## 2019-11-29 DIAGNOSIS — J4550 Severe persistent asthma, uncomplicated: Secondary | ICD-10-CM | POA: Diagnosis not present

## 2019-11-30 ENCOUNTER — Other Ambulatory Visit: Payer: Self-pay | Admitting: Allergy & Immunology

## 2019-12-14 ENCOUNTER — Encounter: Payer: Self-pay | Admitting: Rehabilitation

## 2019-12-14 ENCOUNTER — Other Ambulatory Visit: Payer: Self-pay

## 2019-12-14 ENCOUNTER — Ambulatory Visit: Payer: Medicare Other | Attending: Surgery | Admitting: Rehabilitation

## 2019-12-14 DIAGNOSIS — Z483 Aftercare following surgery for neoplasm: Secondary | ICD-10-CM

## 2019-12-14 DIAGNOSIS — R293 Abnormal posture: Secondary | ICD-10-CM

## 2019-12-14 NOTE — Therapy (Signed)
[place], [place], [address] Phone: [phone]   Fax:  [phone]  Physical Therapy Evaluation  Patient Details  Name: Alicia Hall MRN: [id number] Date of Birth: 1955/03/17 Referring Provider (PT): Dr. Ninfa Linden   Encounter Date: 12/14/2019   PT End of Session - 12/14/19 1131    Visit Number 2    Number of Visits 2    Date for PT Re-Evaluation 11/22/19    PT Start Time 1104    PT Stop Time 1128    PT Time Calculation (min) 24 min    Activity Tolerance Patient tolerated treatment well    Behavior During Therapy [facility] for tasks assessed/performed           Past Medical History:  Diagnosis Date  . Anxiety   . Arthritis   . 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 Cancer ([place]) 09/2019   left breast DCIS  . COPD (chronic obstructive pulmonary disease) ([place])   . Depression   . GERD (gastroesophageal reflux disease)   . Hypertension   . OSA (obstructive sleep apnea)    on CPAP  . Seasonal allergies     Past Surgical History:  Procedure Laterality Date  . BREAST LUMPECTOMY WITH RADIOACTIVE SEED LOCALIZATION Left 11/22/2019   Procedure: LEFT BREAST LUMPECTOMY WITH RADIOACTIVE SEED LOCALIZATION;  Surgeon: Coralie Keens, MD;  Location: [facility];  Service: General;  Laterality: Left;  . COLONOSCOPY N/A 01/31/2015   RMR: Multiple colonic polyps treated/removed as described above  . ESOPHAGOGASTRODUODENOSCOPY N/A 01/31/2015   RMR: mild changes of reflux esophagitis. Tiny hiatal hernia. Status post passage of a Maloney dilator.   Marland Kitchen MALONEY DILATION N/A 01/31/2015   Procedure: Venia Minks DILATION;  Surgeon: Daneil Dolin, MD;  [facility];  Service: Endoscopy;  Laterality: N/A;    There were no vitals filed for this visit.    Subjective Assessment - 12/14/19 1106    Subjective I am doing well no lymph nodes out. I did radiation simulation and it was fine     Pertinent History DCIS Lt breast Lumpectomy 11/22/19 with Dr. Ninfa Linden. No lymph nodes out.   Oncologist care will be at [facility] with Dr. Federico Flake. Other history includes COPD, HTN, asthma, mild emphysema    Currently in Pain? No/denies              [facility] PT Assessment - 12/14/19 0001      Observation/Other Assessments   Observations Rt breast still with bruising and glue/scab around healing nipple incision.  Some general post surgical edema      Posture/Postural Control   Postural Limitations Rounded Shoulders;Forward head      AROM   Left Shoulder Extension 50 Degrees    Left Shoulder Flexion 153 Degrees    Left Shoulder ABduction 155 Degrees    Left Shoulder Internal Rotation 75 Degrees    Left Shoulder External Rotation 85 Degrees                      Objective measurements completed on examination: See above findings.               PT Education - 12/14/19 1129    Education Details low risk of lymphedema, walking during radiation, scar massage starting 4-5 weeks, how to get a bra if needed    Person(s) Educated Patient;Child(ren)    Methods Explanation;Demonstration;Verbal cues;Handout  Comprehension Verbalized understanding            PT Short Term Goals - 12/14/19 1133      PT SHORT TERM GOAL #1   Title Pt will demonstrate return to baseline AROM    Status Achieved      PT SHORT TERM GOAL #2   Title Pt will be educated on where to get bras as needed    Status Achieved                     Plan - 12/14/19 1131    Clinical Impression Statement Pt returns post lumpectomy with no SLNB due to size with return to baseline AROM.  No L-Dex surveillance was set up due to DCIS no lymph node removed.  Pt is ready for radiation and has no further PT needs.    PT Treatment/Interventions Patient/family education    Consulted and Agree with Plan of Care Patient           Patient will benefit from skilled  therapeutic intervention in order to improve the following deficits and impairments:     Visit Diagnosis: Abnormal posture  Aftercare following surgery for neoplasm     Problem List Patient Active Problem List   Diagnosis Date Noted  . Malignant neoplasm of upper-outer quadrant of left breast in female, estrogen receptor positive ([place]) 09/27/2019  . Pulmonary nodules 09/22/2016  . Asthma-COPD overlap syndrome ([place]) 07/28/2016  . COPD, moderate ([place]) 07/05/2015  . Hypersomnia with sleep apnea 06/19/2015  . OSA (obstructive sleep apnea) 06/19/2015  . Depression 06/19/2015  . Insomnia 06/19/2015  . Lung nodule 03/08/2015  . History of colonic polyps   . GERD (gastroesophageal reflux disease) 01/25/2015  . Dysphagia 01/25/2015  . Encounter for screening colonoscopy 01/25/2015  . COPD (chronic obstructive pulmonary disease) ([place]) 12/18/2014  . Moderate persistent asthma 11/23/2014  . Dyspnea and respiratory abnormality 11/23/2014  . Smoking history 11/23/2014  . Upper airway cough syndrome 11/23/2014  . Perennial allergic rhinitis 11/15/2014    Stark Bray 12/14/2019, 11:35 AM  [place], [place], [address] Phone: [phone]   Fax:  [phone]  Name: Alicia Hall MRN: [id number] Date of Birth: 12/29/1955

## 2019-12-14 NOTE — Patient Instructions (Signed)
Scar Massage  Scar massage is done to improve the mobility of scar, decrease scar tissue from building up, reduce adhesions, and prevent Keloids from forming. Start scar massage after scabs have fallen off by themselves and no open areas. The first few weeks after surgery, it is normal for a scar to appear pink or red and slightly raised. Scars can itch or have areas of numbness. Some scars may be sensitive.   Direct Scar massage: after scar is healed, no opening, no scab 1.  Place pads of two fingers together directly on the scar starting at one end of the scar. Move the fingers up and down across the scar holding 5 seconds one direction.  Then go opposite direction hold 5 seconds.  2. Move over to the next section of the scar and repeat.  Work your way along the entire length of the scar.   3. Next make diagonal movements along the scar holding 5 seconds at one direction. 4. Next movement is side to side. 5. Do not rub fingers over the scar.  Instead keep firm pressure and move scar over the tissue it is on top   Scar Lift and Roll 12 weeks after surgery. 1. Pinch a small amount of the scar between your first two fingers and thumb.  2. Roll the scar between your fingers for 5 to 15 seconds. 3. Move along the scar and repeat until you have massaged the entire length of scar.   Stop the massage and call your doctor if you notice: 1. Increased redness 2. Bleeding from scar 3. Seepage coming from the scar 4. Scar is warmer and has increased pain     If you want to get some bras for swelling or if the swelling gets uncomfortable or just normal bras:  Use a prescription to get a bra  [place]: "[facility] [employer]  [address], [place], [place] [address] [phone]

## 2019-12-26 DIAGNOSIS — J4550 Severe persistent asthma, uncomplicated: Secondary | ICD-10-CM | POA: Diagnosis not present

## 2019-12-27 ENCOUNTER — Ambulatory Visit (INDEPENDENT_AMBULATORY_CARE_PROVIDER_SITE_OTHER): Payer: Medicare Other

## 2019-12-27 ENCOUNTER — Other Ambulatory Visit: Payer: Self-pay

## 2019-12-27 DIAGNOSIS — J4540 Moderate persistent asthma, uncomplicated: Secondary | ICD-10-CM

## 2019-12-27 DIAGNOSIS — J4550 Severe persistent asthma, uncomplicated: Secondary | ICD-10-CM | POA: Diagnosis not present

## 2020-01-20 ENCOUNTER — Other Ambulatory Visit: Payer: Self-pay | Admitting: Allergy & Immunology

## 2020-01-23 DIAGNOSIS — J4550 Severe persistent asthma, uncomplicated: Secondary | ICD-10-CM | POA: Diagnosis not present

## 2020-01-24 ENCOUNTER — Other Ambulatory Visit: Payer: Self-pay

## 2020-01-24 ENCOUNTER — Ambulatory Visit (INDEPENDENT_AMBULATORY_CARE_PROVIDER_SITE_OTHER): Payer: Medicare Other

## 2020-01-24 DIAGNOSIS — J4550 Severe persistent asthma, uncomplicated: Secondary | ICD-10-CM

## 2020-01-24 DIAGNOSIS — J4540 Moderate persistent asthma, uncomplicated: Secondary | ICD-10-CM

## 2020-01-25 ENCOUNTER — Ambulatory Visit (INDEPENDENT_AMBULATORY_CARE_PROVIDER_SITE_OTHER): Payer: Medicare Other | Admitting: Nurse Practitioner

## 2020-01-25 ENCOUNTER — Encounter: Payer: Self-pay | Admitting: Nurse Practitioner

## 2020-01-25 ENCOUNTER — Other Ambulatory Visit: Payer: Self-pay

## 2020-01-25 VITALS — BP 155/71 | HR 81 | Temp 96.9°F | Ht 63.0 in | Wt 207.6 lb

## 2020-01-25 DIAGNOSIS — R1319 Other dysphagia: Secondary | ICD-10-CM

## 2020-01-25 DIAGNOSIS — Z86010 Personal history of colonic polyps: Secondary | ICD-10-CM

## 2020-01-25 DIAGNOSIS — K219 Gastro-esophageal reflux disease without esophagitis: Secondary | ICD-10-CM

## 2020-01-25 NOTE — Patient Instructions (Addendum)
Your health issues we discussed today were:   GERD (heartburn/reflux) with previous dysphagia (swallowing difficulties): 1. I am glad your symptoms are doing well! 2. Continue taking Pepcid twice daily over-the-counter 3. Call us if you have any worsening symptoms or recurrent swallowing difficulties  Previous history of colon polyps: 1. As we discussed your next due for colonoscopy in 2026 2. We will send you a letter when it is time to schedule 3. Let us know if you see any obvious bleeding or other concerning symptoms  Overall I recommend:  1. Continue other current medications 2. Return for follow-up as needed 3. Call us for any questions or concerns   ---------------------------------------------------------------  I am glad you have gotten your COVID-19 vaccination!  Even though you are fully vaccinated you should continue to follow CDC and state/local guidelines.  ---------------------------------------------------------------   At [facility] we value your feedback. You may receive a survey about your visit today. Please share your experience as we strive to create trusting relationships with our patients to provide genuine, compassionate, quality care.  We appreciate your understanding and patience as we review any laboratory studies, imaging, and other diagnostic tests that are ordered as we care for you. Our office policy is 5 business days for review of these results, and any emergent or urgent results are addressed in a timely manner for your best interest. If you do not hear from our office in 1 week, please contact us.   We also encourage the use of MyChart, which contains your medical information for your review as well. If you are not enrolled in this feature, an access code is on this after visit summary for your convenience. Thank you for allowing [place] to be involved in your care.  It was great to see you today!  I hope you have a Merry Christmas and  Happy Holidays!!

## 2020-01-25 NOTE — Progress Notes (Signed)
Referring Provider: Caryl Bis, MD Primary Care Physician:  Caryl Bis, MD Primary GI:  Dr. Gala Romney  Chief Complaint  Patient presents with  . Gastroesophageal Reflux    occ    HPI:   Alicia Hall is a 64 y.o. female who presents for follow-up.  Patient was last seen in our office 04/04/2018 for GERD and dysphagia.  Colonoscopy up-to-date in 2016 with recommended repeat in 2026.  Dexilant was previously effective but insurance required trial and failure of 2 of the following: Lansoprazole, esomeprazole, pantoprazole.  We recommended pantoprazole but the patient never called [place] with the report.  At her follow-up visit GERD was doing well and not currently on any PPI or H2 receptor blocker.  No recurrent dysphagia.  No overt GI complaints.  Recommended continue current medications, use Pepcid or Zantac over-the-counter for any recurrent symptoms if unable to obtain prescription of pantoprazole and call our office.  Recommended follow-up in 1 year.  Today she states she is doing okay overall. She is currently on Pepcid twice daily. GERD doing pretty good, rare to no dyphagia (only 1-2 episodes over the past long amount of time). Denies N/V, hematochezia, melena, fever, chills, unintentional weight loss. She just finished radiation for DCIS breast CA s/p surgical resection. Denies URI or flu-like symptoms. Denies loss of sense of taste or smell.  The patient has received COVID-19 vaccination(s). Denies chest pain, dyspnea, dizziness, lightheadedness, syncope, near syncope. Denies any other upper or lower GI symptoms.  Last her husband a year and a half ago. Has good and bad days, has a good support system.  Past Medical History:  Diagnosis Date  . Anxiety   . Arthritis   . 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 Cancer ([place]) 09/2019   left breast DCIS  . COPD (chronic obstructive pulmonary disease) ([place])   . Depression   . GERD (gastroesophageal reflux disease)   .  Hypertension   . OSA (obstructive sleep apnea)    on CPAP  . Seasonal allergies     Past Surgical History:  Procedure Laterality Date  . BREAST LUMPECTOMY WITH RADIOACTIVE SEED LOCALIZATION Left 11/22/2019   Procedure: LEFT BREAST LUMPECTOMY WITH RADIOACTIVE SEED LOCALIZATION;  Surgeon: Coralie Keens, MD;  Location: [facility];  Service: General;  Laterality: Left;  . COLONOSCOPY N/A 01/31/2015   RMR: Multiple colonic polyps treated/removed as described above  . ESOPHAGOGASTRODUODENOSCOPY N/A 01/31/2015   RMR: mild changes of reflux esophagitis. Tiny hiatal hernia. Status post passage of a Maloney dilator.   Marland Kitchen MALONEY DILATION N/A 01/31/2015   Procedure: Venia Minks DILATION;  Surgeon: Daneil Dolin, MD;  [facility];  Service: Endoscopy;  Laterality: N/A;    Current Outpatient Medications  Medication Sig Dispense Refill  . albuterol (PROAIR HFA) 108 (90 Base) MCG/ACT inhaler Inhale 4 puffs every 4-6 hours as needed. 18 g 5  . amLODipine (NORVASC) 10 MG tablet Take 10 mg by mouth daily.    Marland Kitchen CALCIUM-VITAMIN D PO Take by mouth daily.    Marland Kitchen EPINEPHrine (AUVI-Q) 0.3 mg/0.3 mL IJ SOAJ injection Inject 0.3 mLs (0.3 mg total) into the muscle as needed for anaphylaxis. 2 each 1  . famotidine (PEPCID) 40 MG tablet Take 40 mg by mouth 2 (two) times daily.    . Glucos-Chond-Hyal Ac-Ca Fructo (MOVE FREE JOINT HEALTH ADVANCE) TABS Take by mouth.    . letrozole (FEMARA) 2.5 MG tablet Take 2.5 mg by  mouth daily.    Marland Kitchen loratadine (CLARITIN) 10 MG tablet Take 10 mg by mouth daily.    Marland Kitchen losartan-hydrochlorothiazide (HYZAAR) 100-12.5 MG tablet Take 1 tablet by mouth daily.    . magnesium oxide (MAG-OX) 400 MG tablet Take 400 mg by mouth daily.    . montelukast (SINGULAIR) 10 MG tablet Take 1 tablet by mouth once daily 90 tablet 0  . Multiple Vitamin (MULTIVITAMIN WITH MINERALS) TABS tablet Take 1 tablet by mouth daily.    . Omega-3 Fatty Acids (FISH OIL) 1000 MG CAPS Take 1  capsule by mouth daily.     . Red Yeast Rice 600 MG CAPS Take 2 capsules by mouth daily.    . sertraline (ZOLOFT) 100 MG tablet Take 200 mg by mouth daily.     . SYMBICORT 160-4.5 MCG/ACT inhaler Inhale 2 puffs by mouth twice daily 11 g 1   Current Facility-Administered Medications  Medication Dose Route Frequency Provider Last Rate Last Admin  . Mepolizumab SOLR 100 mg  100 mg Subcutaneous Q28 days Valentina Shaggy, MD   100 mg at 01/24/20 0932    Allergies as of 01/25/2020 - Review Complete 01/25/2020  Allergen Reaction Noted  . Levaquin [levofloxacin]    . Lisinopril Cough 11/15/2014    Family History  Problem Relation Age of Onset  . Emphysema Father   . Emphysema Mother   . Lung cancer Maternal Grandfather   . Heart attack Maternal Grandmother   . Colon cancer Cousin     Social History   Socioeconomic History  . Marital status: Married    Spouse name: Not on file  . Number of children: Not on file  . Years of education: Not on file  . Highest education level: Not on file  Occupational History  . Occupation: house wife  Tobacco Use  . Smoking status: Former Smoker    Packs/day: 1.50    Years: 25.00    Pack years: 37.50    Types: Cigarettes    Quit date: 02/24/1995    Years since quitting: 24.9  . Smokeless tobacco: Never Used  Vaping Use  . Vaping Use: Never used  Substance and Sexual Activity  . Alcohol use: Yes    Alcohol/week: 0.0 standard drinks    Comment: rarely  . Drug use: No  . Sexual activity: Not on file  Other Topics Concern  . Not on file  Social History Narrative  . Not on file   Social Determinants of Health   Financial Resource Strain:   . Difficulty of Paying Living Expenses: Not on file  Food Insecurity:   . Worried About Charity fundraiser in the Last Year: Not on file  . Ran Out of Food in the Last Year: Not on file  Transportation Needs:   . Lack of Transportation (Medical): Not on file  . Lack of Transportation  (Non-Medical): Not on file  Physical Activity:   . Days of Exercise per Week: Not on file  . Minutes of Exercise per Session: Not on file  Stress:   . Feeling of Stress : Not on file  Social Connections:   . Frequency of Communication with Friends and Family: Not on file  . Frequency of Social Gatherings with Friends and Family: Not on file  . Attends Religious Services: Not on file  . Active Member of Clubs or Organizations: Not on file  . Attends Archivist Meetings: Not on file  . Marital Status: Not on file  Subjective: Review of Systems  Constitutional: Negative for chills, fever, malaise/fatigue and weight loss.  HENT: Negative for congestion and sore throat.   Respiratory: Negative for cough and shortness of breath.   Cardiovascular: Negative for chest pain and palpitations.  Gastrointestinal: Positive for heartburn. Negative for abdominal pain, blood in stool, diarrhea, melena, nausea and vomiting.  Musculoskeletal: Negative for joint pain and myalgias.  Skin: Negative for rash.  Neurological: Negative for dizziness and weakness.  Endo/Heme/Allergies: Does not bruise/bleed easily.  Psychiatric/Behavioral: Negative for depression. The patient is not nervous/anxious.   All other systems reviewed and are negative.    Objective: BP (!) 155/71   Pulse 81   Temp (!) 96.9 F (36.1 C) (Temporal)   Ht 5\' 3"  (1.6 m)   Wt 207 lb 9.6 oz (94.2 kg)   BMI 36.77 kg/m  Physical Exam Vitals and nursing note reviewed.  Constitutional:      General: She is not in acute distress.    Appearance: Normal appearance. She is well-developed. She is obese. She is not ill-appearing, toxic-appearing or diaphoretic.  HENT:     Head: Normocephalic and atraumatic.     Nose: No congestion or rhinorrhea.  Eyes:     General: No scleral icterus. Cardiovascular:     Rate and Rhythm: Normal rate and regular rhythm.     Heart sounds: Normal heart sounds.  Pulmonary:     Effort:  Pulmonary effort is normal. No respiratory distress.     Breath sounds: Normal breath sounds.  Abdominal:     General: Bowel sounds are normal.     Palpations: Abdomen is soft. There is no hepatomegaly, splenomegaly or mass.     Tenderness: There is no abdominal tenderness. There is no guarding or rebound.     Hernia: No hernia is present.  Skin:    General: Skin is warm and dry.     Coloration: Skin is not jaundiced.     Findings: No rash.  Neurological:     General: No focal deficit present.     Mental Status: She is alert and oriented to person, place, and time.  Psychiatric:        Attention and Perception: Attention normal.        Mood and Affect: Mood normal.        Speech: Speech normal.        Behavior: Behavior normal.        Thought Content: Thought content normal.        Cognition and Memory: Cognition and memory normal.      Assessment:  Very pleasant 64 year old female presents for follow-up on GERD and previous history dysphagia.  Currently her GERD is doing pretty well on Pepcid over-the-counter twice daily.  Has only had 1 or 2 episodes of dysphagia in the past year and a half.  She is not wanting EGD at this point and feels it is not really a significant problem.  At this point she is done well for the past couple years and subsequently we can hold off on routine follow-up.   Plan: 1. Continue current medications 2. Let us know if there is any worsening symptoms 3. Follow-up as needed 4. We will notify when it is time for the next colonoscopy in 2026.    Thank you for allowing [place] to participate in the care of Alicia Hall  Walden Field, DNP, AGNP-C Adult & Gerontological Nurse Practitioner [facility]   01/25/2020 3:11 PM   Disclaimer:  This note was dictated with voice recognition software. Similar sounding words can inadvertently be transcribed and may not be corrected upon review.

## 2020-02-20 ENCOUNTER — Other Ambulatory Visit: Payer: Self-pay | Admitting: Allergy & Immunology

## 2020-02-20 DIAGNOSIS — J4550 Severe persistent asthma, uncomplicated: Secondary | ICD-10-CM | POA: Diagnosis not present

## 2020-02-21 ENCOUNTER — Other Ambulatory Visit: Payer: Self-pay

## 2020-02-21 ENCOUNTER — Ambulatory Visit (INDEPENDENT_AMBULATORY_CARE_PROVIDER_SITE_OTHER): Payer: Medicare Other

## 2020-02-21 DIAGNOSIS — J4540 Moderate persistent asthma, uncomplicated: Secondary | ICD-10-CM

## 2020-02-21 DIAGNOSIS — J4550 Severe persistent asthma, uncomplicated: Secondary | ICD-10-CM | POA: Diagnosis not present

## 2020-03-08 ENCOUNTER — Encounter: Payer: Self-pay | Admitting: Allergy & Immunology

## 2020-03-08 ENCOUNTER — Other Ambulatory Visit: Payer: Self-pay

## 2020-03-08 ENCOUNTER — Ambulatory Visit (INDEPENDENT_AMBULATORY_CARE_PROVIDER_SITE_OTHER): Payer: Medicare Other | Admitting: Allergy & Immunology

## 2020-03-08 VITALS — BP 138/70 | HR 66 | Resp 18

## 2020-03-08 DIAGNOSIS — R918 Other nonspecific abnormal finding of lung field: Secondary | ICD-10-CM | POA: Diagnosis not present

## 2020-03-08 DIAGNOSIS — J3089 Other allergic rhinitis: Secondary | ICD-10-CM | POA: Diagnosis not present

## 2020-03-08 DIAGNOSIS — J4540 Moderate persistent asthma, uncomplicated: Secondary | ICD-10-CM | POA: Diagnosis not present

## 2020-03-08 NOTE — Progress Notes (Signed)
FOLLOW UP  Date of Service/Encounter:  03/08/20   Assessment:   Asthma-COPD overlap syndrome- with eosinophilic phenotypeon Nucala  Seasonal and perennial allergic rhinitis(grasses, weeds, trees, molds, dust mites, cat, horse, and cockroach)  Pulmonary nodules- followed by Dr. Chase Caller (last chest CT October 2018 stable)  Plan/Recommendations:   1. Asthma-COPD overlap syndrome - Lung function looked terrible but it did improve with the albuterol puffs. - Spacer sample and demonstration provided, as well as compliance.   - I think you have a good grasp on your symptoms. - Samples of Breztri provided (this is Symbicort plus another medication that helps keep your lungs open). - Hopefully after you use these up, you would have met your deductible by that time.  - Daily controller medication(s): Singulair 25m daily + Symbicort 160/4.542m two puffs 1-2 times daily with spacer + Nucala monthly    - Prior to physical activity: ProAir 2 puffs 10-15 minutes before physical activity. - Rescue medications: ProAir 4 puffs every 4-6 hours as needed or albuterol nebulizer one vial every 4-6 hours as needed - Changes during respiratory infections or worsening symptoms: Add on Asmanex 10024mto 2 puffs twice daily for ONE TO TWO WEEKS. - Asthma control goals:  * Full participation in all desired activities (may need albuterol before activity) * Albuterol use two time or less a week on average (not counting use with activity) * Cough interfering with sleep two time or less a month * Oral steroids no more than once a year * No hospitalizations  2. Seasonal and perennial allergic rhinitis - Continue with Nasacort 1-2 sprays per nostril up to twice daily as needed.  - Continue with cetirizine 52m66mily. - Continue with montelukast (Singulair) 52mg56mly.   3. Return in about 6 months (around 09/05/2020).    Subjective:   Alicia Hall 64 y.86 female presenting today for  follow up of  Chief Complaint  Patient presents with  . Asthma    Doing okay. She feels it is. Some wheezing at times, but nothing bad.     Alicia Hall has a history of the following: Patient Active Problem List   Diagnosis Date Noted  . Malignant neoplasm of upper-outer quadrant of left breast in female, estrogen receptor positive (HCC) [place]04/2021  . Pulmonary nodules 09/22/2016  . Asthma-COPD overlap syndrome (HCC) [place]05/2018  . COPD, moderate (HCC) [place]12/2017  . Hypersomnia with sleep apnea 06/19/2015  . OSA (obstructive sleep apnea) 06/19/2015  . Depression 06/19/2015  . Insomnia 06/19/2015  . Lung nodule 03/08/2015  . History of colonic polyps   . GERD (gastroesophageal reflux disease) 01/25/2015  . Dysphagia 01/25/2015  . Encounter for screening colonoscopy 01/25/2015  . COPD (chronic obstructive pulmonary disease) (HCC) [place]25/2016  . Moderate persistent asthma 11/23/2014  . Dyspnea and respiratory abnormality 11/23/2014  . Smoking history 11/23/2014  . Upper airway cough syndrome 11/23/2014  . Perennial allergic rhinitis 11/15/2014    History obtained from: chart review and patient.  Alicia Hall 64 y.39 female presenting for a follow up visit.  She was last seen in July 2021.  At that time, her lung function look perfect.  We continue with Singulair daily as well as Symbicort 2 puffs twice daily and combination with mepolizumab monthly.  She had Asmanex that she had done during respiratory flares.  For her rhinitis, she continue with Nasacort as well as cetirizine and Singulair.  Since the last visit, she has done well. She had holidays at  her oldest daughter. She had a good time for Christmas. Her daughter recently went to the beach and came back sick.  Thankfully, she decided not to go to the beach with them.  She has not seen her daughter since Christmas with everybody has been sick.  The testing is pending.  Asthma/Respiratory Symptom History: She remains on the  Symbicort but this is very expensive because she is in the donut hole.  She had a $400 from her last diet.  However, it should be around $45 when she reaches her deductible.  She has not required any prednisone since last visit.  She is up-to-date on her mepolizumab.  The mepolizumab had kept her steroid cream for quite some time.  She has not been to the hospital.  She has not been using her rescue inhaler.  Upon further questioning, and states that she has not been using her spacer either, which might explain her terrible spirometry today.  Allergic Rhinitis Symptom History: She remains on cetirizine and Nasacort.  She has not needed antibiotics at all.   She has received 2 COVID-19 vaccine.  She is not sure she has noticed these are because she does not want to have the postvaccine syndrome.  Otherwise, there have been no changes to her past medical history, surgical history, family history, or social history.    Review of Systems  Constitutional: Negative.  Negative for chills, fever, malaise/fatigue and weight loss.  HENT: Positive for congestion. Negative for ear discharge, ear pain and sinus pain.   Eyes: Negative for pain, discharge and redness.  Respiratory: Negative for cough, sputum production, shortness of breath and wheezing.   Cardiovascular: Negative.  Negative for chest pain and palpitations.  Gastrointestinal: Negative for abdominal pain, constipation, diarrhea, heartburn, nausea and vomiting.  Skin: Negative.  Negative for itching and rash.  Neurological: Negative for dizziness and headaches.  Endo/Heme/Allergies: Positive for environmental allergies. Does not bruise/bleed easily.       Objective:   Blood pressure 138/70, pulse 66, resp. rate 18, SpO2 97 %. There is no height or weight on file to calculate BMI.   Physical Exam:  Physical Exam Constitutional:      Appearance: She is well-developed.     Comments: Very pleasant female.  She states that more sad than  she normally does, she is still very interactive using.  HENT:     Head: Normocephalic and atraumatic.     Right Ear: Tympanic membrane, ear canal and external ear normal.     Left Ear: Tympanic membrane, ear canal and external ear normal.     Nose: No nasal deformity, septal deviation, mucosal edema, rhinorrhea or epistaxis.     Right Turbinates: Enlarged.     Left Turbinates: Enlarged.     Right Sinus: No maxillary sinus tenderness or frontal sinus tenderness.     Left Sinus: No maxillary sinus tenderness or frontal sinus tenderness.     Comments: She does have some dried rhinorrhea bilaterally.  No nasal polyps appreciated.  Septum midline.    Mouth/Throat:     Mouth: Oropharynx is clear and moist. Mucous membranes are not pale and not dry.     Pharynx: Uvula midline.     Comments: Tonsils unremarkable.  No cobblestoning. Eyes:     General:        Right eye: No discharge.        Left eye: No discharge.     Extraocular Movements: EOM normal.     Conjunctiva/sclera: Conjunctivae  normal.     Right eye: Right conjunctiva is not injected. No chemosis.    Left eye: Left conjunctiva is not injected. No chemosis.    Pupils: Pupils are equal, round, and reactive to light.  Cardiovascular:     Rate and Rhythm: Normal rate and regular rhythm.     Heart sounds: Normal heart sounds.  Pulmonary:     Effort: Pulmonary effort is normal. No tachypnea, accessory muscle usage or respiratory distress.     Breath sounds: Normal breath sounds. No wheezing, rhonchi or rales.     Comments: Medicare well in all lung fields.  No increased work of breathing. Chest:     Chest wall: No tenderness.  Lymphadenopathy:     Cervical: No cervical adenopathy.  Skin:    General: Skin is warm.     Capillary Refill: Capillary refill takes less than 2 seconds.     Coloration: Skin is not pale.     Findings: No abrasion, erythema, petechiae or rash. Rash is not papular, urticarial or vesicular.     Comments: No  eczematous or urticarial lesions noted.  Neurological:     Mental Status: She is alert.  Psychiatric:        Mood and Affect: Mood and affect normal.        Behavior: Behavior is cooperative.      Diagnostic studies:    Spirometry: results abnormal (FEV1: 0.95/44%, FVC: 1.40/49%, FEV1/FVC: 68%).    Spirometry consistent with possible restrictive disease. Albuterol four puffs via MDI treatment given in clinic with significant improvement in FEV1 and FVC per ATS criteria. It did not normalize, but her FEV1 increased to 32% and her FVC increased 22%.  Allergy Studies: none        Salvatore Marvel, MD  Allergy and [facility]

## 2020-03-08 NOTE — Patient Instructions (Addendum)
1. Asthma-COPD overlap syndrome - Lung function AWES  - I think you have a good grasp on your symptoms. - Samples of Breztri provided (this is Symbicort plus another medication that helps keep your lungs open). - Hopefully after you use these up, you would have met your deductible by that time.  - Daily controller medication(s): Singulair 70m daily + Symbicort 160/4.560m two puffs 1-2 times daily with spacer + Nucala monthly    - Prior to physical activity: ProAir 2 puffs 10-15 minutes before physical activity. - Rescue medications: ProAir 4 puffs every 4-6 hours as needed or albuterol nebulizer one vial every 4-6 hours as needed - Changes during respiratory infections or worsening symptoms: Add on Asmanex 10021mto 2 puffs twice daily for ONE TO TWO WEEKS. - Asthma control goals:  * Full participation in all desired activities (may need albuterol before activity) * Albuterol use two time or less a week on average (not counting use with activity) * Cough interfering with sleep two time or less a month * Oral steroids no more than once a year * No hospitalizations  2. Seasonal and perennial allergic rhinitis - Continue with Nasacort 1-2 sprays per nostril up to twice daily as needed.  - Continue with cetirizine 20m8mily. - Continue with montelukast (Singulair) 20mg5mly.   3. Return in about 6 months (around 09/05/2020).    Please inform us of[place] Emergency Department visits, hospitalizations, or changes in symptoms. Call us beKoreare going to the ED for breathing or allergy symptoms since we might be able to fit you in for a sick visit. Feel free to contact us anKoreaime with any questions, problems, or concerns. 2 It was a pleasure to see you again today!  Websites that have reliable patient information: 1. American Academy of Asthma, Allergy, and Immunology: www.aaaai.org 2. Food Allergy Research and Education (FARE): foodallergy.org 3. Mothers of Asthmatics:  http://www.asthmacommunitynetwork.org 4. American College of Allergy, Asthma, and Immunology: www.acaai.org   COVID-19 Vaccine Information can be found at: [email or web address] related to vaccine distribution or appointments, please email [email or web address] or call [phone] "Like" us onKoreaacebook and Instagram for our latest updates!       Make sure you are registered to vote! If you have moved or changed any of your contact information, you will need to get this updated before voting!  In some cases, you MAY be able to register to vote online: [email or web address]

## 2020-03-19 DIAGNOSIS — J4550 Severe persistent asthma, uncomplicated: Secondary | ICD-10-CM | POA: Diagnosis not present

## 2020-03-20 ENCOUNTER — Ambulatory Visit (INDEPENDENT_AMBULATORY_CARE_PROVIDER_SITE_OTHER): Payer: Medicare Other

## 2020-03-20 ENCOUNTER — Other Ambulatory Visit: Payer: Self-pay

## 2020-03-20 DIAGNOSIS — J4540 Moderate persistent asthma, uncomplicated: Secondary | ICD-10-CM

## 2020-03-20 DIAGNOSIS — J4550 Severe persistent asthma, uncomplicated: Secondary | ICD-10-CM

## 2020-04-17 ENCOUNTER — Ambulatory Visit (INDEPENDENT_AMBULATORY_CARE_PROVIDER_SITE_OTHER): Payer: Medicare Other

## 2020-04-17 ENCOUNTER — Other Ambulatory Visit: Payer: Self-pay

## 2020-04-17 DIAGNOSIS — J4540 Moderate persistent asthma, uncomplicated: Secondary | ICD-10-CM

## 2020-04-26 ENCOUNTER — Telehealth: Payer: Self-pay | Admitting: *Deleted

## 2020-04-26 NOTE — Telephone Encounter (Signed)
T/C to patient to explain that our practice will no longer be able to buy and bill Nucala due to [facility] not paying cost of drug.  I advised her of Berna Bue that we could do to replace same and she is fine with that.  She will start same at her next injection

## 2020-05-14 DIAGNOSIS — J4550 Severe persistent asthma, uncomplicated: Secondary | ICD-10-CM | POA: Diagnosis not present

## 2020-05-15 ENCOUNTER — Other Ambulatory Visit: Payer: Self-pay

## 2020-05-15 ENCOUNTER — Ambulatory Visit (INDEPENDENT_AMBULATORY_CARE_PROVIDER_SITE_OTHER): Payer: Medicare Other

## 2020-05-15 DIAGNOSIS — J4550 Severe persistent asthma, uncomplicated: Secondary | ICD-10-CM | POA: Diagnosis not present

## 2020-05-15 MED ORDER — BENRALIZUMAB 30 MG/ML ~~LOC~~ SOSY
30.0000 mg | PREFILLED_SYRINGE | SUBCUTANEOUS | Status: AC
  Administered 2020-05-15 – 2020-07-10 (×3): 30 mg via SUBCUTANEOUS

## 2020-05-15 NOTE — Progress Notes (Signed)
Immunotherapy   Patient Details  Name: Alicia Hall MRN: [id number] Date of Birth: 1955/05/09  05/15/2020  Alicia Hall here to start fasenra for asthma. Patient waited 30 minutes in clinic with no problems. Frequency: every 4 weeks times 3 doses then every 8 weeks Epi-Pen: Yes Consent signed and patient instructions given.   Herbie Drape 05/15/2020, 10:10 AM

## 2020-06-11 DIAGNOSIS — J4550 Severe persistent asthma, uncomplicated: Secondary | ICD-10-CM

## 2020-06-12 ENCOUNTER — Ambulatory Visit (INDEPENDENT_AMBULATORY_CARE_PROVIDER_SITE_OTHER): Payer: Medicare Other | Admitting: *Deleted

## 2020-06-12 ENCOUNTER — Other Ambulatory Visit: Payer: Self-pay

## 2020-06-12 DIAGNOSIS — J4550 Severe persistent asthma, uncomplicated: Secondary | ICD-10-CM

## 2020-06-18 ENCOUNTER — Telehealth: Payer: Self-pay | Admitting: Allergy & Immunology

## 2020-06-18 MED ORDER — EPINEPHRINE 0.3 MG/0.3ML IJ SOAJ: 0.3000 mg | INTRAMUSCULAR | 1 refills | Status: DC | PRN

## 2020-06-18 NOTE — Telephone Encounter (Signed)
Auvi-q sent to [facility]. 

## 2020-06-18 NOTE — Telephone Encounter (Signed)
Patient states her Epi Pen is expiring this month and needs a refill. Patient prefers Auvi-Q if her insurance will cover it.  Uses [facility] in [place].  Please advise.

## 2020-06-19 MED ORDER — EPINEPHRINE 0.3 MG/0.3ML IJ SOAJ: INTRAMUSCULAR | 3 refills | Status: DC

## 2020-06-19 NOTE — Telephone Encounter (Addendum)
Epi-Pen sent to [facility]. Tried to contact patient, left voicemail message.

## 2020-06-19 NOTE — Telephone Encounter (Signed)
Patient called back to see if this prescription had been sent. I told her it had been sent to [facility]. She asked for the number and will call them if she doesn't hear from them in a couple of days.

## 2020-06-19 NOTE — Addendum Note (Signed)
Addended by: Mellody Memos on: 06/19/2020 04:59 PM   Modules accepted: Orders

## 2020-06-19 NOTE — Telephone Encounter (Signed)
Patient called stating Colletta Maryland @ [facility] called to tell her our office will need to do a prior authorization to get the [employer]  [phone]

## 2020-06-20 NOTE — Telephone Encounter (Signed)
Patient has been made aware.

## 2020-07-09 DIAGNOSIS — J4550 Severe persistent asthma, uncomplicated: Secondary | ICD-10-CM | POA: Diagnosis not present

## 2020-07-10 ENCOUNTER — Ambulatory Visit (INDEPENDENT_AMBULATORY_CARE_PROVIDER_SITE_OTHER): Payer: Medicare Other

## 2020-07-10 ENCOUNTER — Other Ambulatory Visit: Payer: Self-pay

## 2020-07-10 DIAGNOSIS — J4550 Severe persistent asthma, uncomplicated: Secondary | ICD-10-CM

## 2020-09-03 DIAGNOSIS — J4550 Severe persistent asthma, uncomplicated: Secondary | ICD-10-CM | POA: Diagnosis not present

## 2020-09-04 ENCOUNTER — Ambulatory Visit (INDEPENDENT_AMBULATORY_CARE_PROVIDER_SITE_OTHER): Payer: Medicare Other

## 2020-09-04 ENCOUNTER — Ambulatory Visit: Payer: Medicare Other | Admitting: Family Medicine

## 2020-09-04 ENCOUNTER — Other Ambulatory Visit: Payer: Self-pay

## 2020-09-04 DIAGNOSIS — J4550 Severe persistent asthma, uncomplicated: Secondary | ICD-10-CM | POA: Diagnosis not present

## 2020-09-04 MED ORDER — BENRALIZUMAB 30 MG/ML ~~LOC~~ SOSY
30.0000 mg | PREFILLED_SYRINGE | SUBCUTANEOUS | Status: DC
  Administered 2020-09-04 – 2022-03-27 (×11): 30 mg via SUBCUTANEOUS

## 2020-10-18 ENCOUNTER — Ambulatory Visit: Payer: Medicare Other | Admitting: Allergy & Immunology

## 2020-10-29 DIAGNOSIS — J4550 Severe persistent asthma, uncomplicated: Secondary | ICD-10-CM | POA: Diagnosis not present

## 2020-10-30 ENCOUNTER — Encounter: Payer: Self-pay | Admitting: Allergy & Immunology

## 2020-10-30 ENCOUNTER — Other Ambulatory Visit: Payer: Self-pay

## 2020-10-30 ENCOUNTER — Ambulatory Visit: Payer: Self-pay

## 2020-10-30 ENCOUNTER — Ambulatory Visit (INDEPENDENT_AMBULATORY_CARE_PROVIDER_SITE_OTHER): Payer: Medicare Other | Admitting: Allergy & Immunology

## 2020-10-30 VITALS — BP 140/80 | HR 66 | Temp 97.9°F | Resp 16 | Ht 63.0 in | Wt 196.2 lb

## 2020-10-30 DIAGNOSIS — J4550 Severe persistent asthma, uncomplicated: Secondary | ICD-10-CM

## 2020-10-30 DIAGNOSIS — J3089 Other allergic rhinitis: Secondary | ICD-10-CM

## 2020-10-30 NOTE — Progress Notes (Signed)
FOLLOW UP  Date of Service/Encounter:  10/30/20   Assessment:   Asthma-COPD overlap syndrome - with eosinophilic phenotype on Fasenra   Seasonal and perennial allergic rhinitis (grasses, weeds, trees, molds, dust mites, cat, horse, and cockroach)   Pulmonary nodules - followed by Dr. Chase Caller (last chest CT October 2018 stable)  DCIS - s/p radiation and resection with 5 years of hormonal therapy  Plan/Recommendations:   1. Asthma-COPD overlap syndrome - Lung function looked stable today. - We are not going to make any changes at this time.  - Daily controller medication(s): Singulair '10mg'$  daily + Breztri two puffs 1-2 times daily with spacer + Fasenra every 8 weeks     - Prior to physical activity: ProAir 2 puffs 10-15 minutes before physical activity. - Rescue medications: ProAir 4 puffs every 4-6 hours as needed or albuterol nebulizer one vial every 4-6 hours as needed - Changes during respiratory infections or worsening symptoms: Add on Asmanex 125mg to 2 puffs twice daily for ONE TO TWO WEEKS. - Asthma control goals:  * Full participation in all desired activities (may need albuterol before activity) * Albuterol use two time or less a week on average (not counting use with activity) * Cough interfering with sleep two time or less a month * Oral steroids no more than once a year * No hospitalizations  2. Seasonal and perennial allergic rhinitis - Continue with Nasacort 1-2 sprays per nostril up to twice daily as needed.  - Continue with cetirizine '10mg'$  daily. - Continue with montelukast (Singulair) '10mg'$  daily.   3. Return in about 6 months (around 04/29/2021).    Subjective:   Alicia SAELEEis a 65y.o. female presenting today for follow up of  Chief Complaint  Patient presents with   Asthma    No flares    Other    Saw a dermatologist for them to a biopsy and it came back a dermatitis along with some other stuff - August 27th     Alicia MuscarellaSFolsom [facility] Centerhas a  history of the following: Patient Active Problem List   Diagnosis Date Noted   Malignant neoplasm of upper-outer quadrant of left breast in female, estrogen receptor positive [place] 09/27/2019   Pulmonary nodules 09/22/2016   Asthma-COPD overlap syndrome ([place] 07/28/2016   COPD, moderate ([place] 07/05/2015   Hypersomnia with sleep apnea 06/19/2015   OSA (obstructive sleep apnea) 06/19/2015   Depression 06/19/2015   Insomnia 06/19/2015   Lung nodule 03/08/2015   History of colonic polyps    GERD (gastroesophageal reflux disease) 01/25/2015   Dysphagia 01/25/2015   Encounter for screening colonoscopy 01/25/2015   COPD (chronic obstructive pulmonary disease) ([place] 12/18/2014   Moderate persistent asthma 11/23/2014   Dyspnea and respiratory abnormality 11/23/2014   Smoking history 11/23/2014   Upper airway cough syndrome 11/23/2014   Perennial allergic rhinitis 11/15/2014    History obtained from: chart review and patient.  [name] a 65y.o. female presenting for a follow up visit.  She was last seen in January 2022.  At that time, her lung function looked terrible but improved with albuterol.  We gave her samples of Breztri to use in place of her Symbicort.  We continued with FBerna Bueas well as Singulair.  For her rhinitis, we continued with Nasacort as well as cetirizine and the Singulair.  Since last visit, she has done well.  She is done with her chemotherapy and radiation for her ductal carcinoma in situ.  She is on  5years hormonal replacement with letrozole 2.5 mg daily.  Her hematologist/oncologist is Dr. Federico Flake.   She had a bone density test.  These results are pending.  She does feel that things are going well. She does get congested. THe change of weather can be an issue for hre.   Asthma/Respiratory Symptom History: She remains on the Breztri two puffs twice daily.  She is on the [place] every 8 weeks as well.  This combination seems to be working.  She does not remember the  last time that she needed any prednisone at all.  Allergic Rhinitis Symptom History: She remains on her cetirizine and her Singulair.  She is doing well with this regimen.  She has not needed prednisone.  Otherwise, there have been no changes to her past medical history, surgical history, family history, or social history.    Review of Systems  Constitutional: Negative.  Negative for fever, malaise/fatigue and weight loss.  HENT: Negative.  Negative for congestion, ear discharge and ear pain.   Eyes:  Negative for pain, discharge and redness.  Respiratory:  Positive for cough. Negative for sputum production and wheezing.   Cardiovascular: Negative.  Negative for chest pain and palpitations.  Gastrointestinal:  Negative for abdominal pain, constipation, diarrhea, heartburn, nausea and vomiting.  Skin: Negative.  Negative for itching and rash.  Neurological:  Negative for dizziness and headaches.  Endo/Heme/Allergies:  Negative for environmental allergies. Does not bruise/bleed easily.      Objective:   Blood pressure 140/80, pulse 66, temperature 97.9 F (36.6 C), resp. rate 16, height '5\' 3"'$  (1.6 m), weight 196 lb 3.2 oz (89 kg), SpO2 97 %. Body mass index is 34.76 kg/m.   Physical Exam:  Physical Exam Vitals reviewed.  Constitutional:      Appearance: She is well-developed.     Comments: She looks like she has lost weight.  HENT:     Head: Normocephalic and atraumatic.     Right Ear: Tympanic membrane, ear canal and external ear normal.     Left Ear: Tympanic membrane, ear canal and external ear normal.     Nose: No nasal deformity, septal deviation, mucosal edema or rhinorrhea.     Right Turbinates: Enlarged and swollen.     Left Turbinates: Enlarged and swollen.     Right Sinus: No maxillary sinus tenderness or frontal sinus tenderness.     Left Sinus: No maxillary sinus tenderness or frontal sinus tenderness.     Comments: No nasal polyps.    Mouth/Throat:     Mouth:  Mucous membranes are not pale and not dry.     Pharynx: Uvula midline.  Eyes:     General: Lids are normal. No allergic shiner.       Right eye: No discharge.        Left eye: No discharge.     Conjunctiva/sclera: Conjunctivae normal.     Right eye: Right conjunctiva is not injected. No chemosis.    Left eye: Left conjunctiva is not injected. No chemosis.    Pupils: Pupils are equal, round, and reactive to light.  Cardiovascular:     Rate and Rhythm: Normal rate and regular rhythm.     Heart sounds: Normal heart sounds.  Pulmonary:     Effort: Pulmonary effort is normal. No tachypnea, accessory muscle usage or respiratory distress.     Breath sounds: Normal breath sounds. No wheezing, rhonchi or rales.     Comments: Moving air well in all lung fields.  No  increased work of breathing. Chest:     Chest wall: No tenderness.  Lymphadenopathy:     Cervical: No cervical adenopathy.  Skin:    General: Skin is warm.     Capillary Refill: Capillary refill takes less than 2 seconds.     Coloration: Skin is not pale.     Findings: No abrasion, erythema, petechiae or rash. Rash is not papular, urticarial or vesicular.     Comments: No eczematous or urticarial lesions noted.  Neurological:     Mental Status: She is alert.  Psychiatric:        Behavior: Behavior is cooperative.     Diagnostic studies:   Spirometry: Overall, values are stable.       Salvatore Marvel, MD  [facility]

## 2020-10-30 NOTE — Patient Instructions (Addendum)
1. Asthma-COPD overlap syndrome - Lung function looked stable today. - We are not going to make any changes at this time.  - Daily controller medication(s): Singulair '10mg'$  daily + Breztri two puffs 1-2 times daily with spacer + Fasenra every 8 weeks     - Prior to physical activity: ProAir 2 puffs 10-15 minutes before physical activity. - Rescue medications: ProAir 4 puffs every 4-6 hours as needed or albuterol nebulizer one vial every 4-6 hours as needed - Changes during respiratory infections or worsening symptoms: Add on Asmanex 166mg to 2 puffs twice daily for ONE TO TWO WEEKS. - Asthma control goals:  * Full participation in all desired activities (may need albuterol before activity) * Albuterol use two time or less a week on average (not counting use with activity) * Cough interfering with sleep two time or less a month * Oral steroids no more than once a year * No hospitalizations  2. Seasonal and perennial allergic rhinitis - Continue with Nasacort 1-2 sprays per nostril up to twice daily as needed.  - Continue with cetirizine '10mg'$  daily. - Continue with montelukast (Singulair) '10mg'$  daily.   3. Return in about 6 months (around 04/29/2021).    Please inform u[place]of any Emergency Department visits, hospitalizations, or changes in symptoms. Call u[place]abefore going to the ED for breathing or allergy symptoms since we might be able to fit you in for a sick visit. Feel free to contact uKoreaanytime with any questions, problems, or concerns. 2 It was a pleasure to see you again today!  Websites that have reliable patient information: 1. American Academy of Asthma, Allergy, and Immunology: www.aaaai.org 2. Food Allergy Research and Education (FARE): foodallergy.org 3. Mothers of Asthmatics: http://www.asthmacommunitynetwork.org 4. American College of Allergy, Asthma, and Immunology: www.acaai.org   COVID-19 Vaccine Information can be found at:  [email or web address] questions related to vaccine distribution or appointments, please email [email or web address] or call [phone]     "Like" u[place]on Facebook and Instagram for our latest updates!       Make sure you are registered to vote! If you have moved or changed any of your contact information, you will need to get this updated before voting!  In some cases, you MAY be able to register to vote online: [email or web address]

## 2020-11-20 ENCOUNTER — Telehealth: Payer: Self-pay

## 2020-11-20 MED ORDER — BREZTRI AEROSPHERE 160-9-4.8 MCG/ACT IN AERO: 2.0000 | INHALATION_SPRAY | Freq: Two times a day (BID) | RESPIRATORY_TRACT | 5 refills | Status: DC

## 2020-11-20 NOTE — Telephone Encounter (Signed)
Patient called and expressed that she was low on her Breztri. I informed patient that we did not have any in our office. I gave her the number for Brezti(1-588-4-BREZTRI:2739874) to get a coupon code to get her Judithann Sauger today. I have also sent [employer]ME forms to help her with her future medications. Patient was appreciative and expressed that she would pick up her medications tomorrow or next week after assistance from her daughter to get the coupon. A refill has been sent as well as the application forms.

## 2020-12-24 DIAGNOSIS — J4550 Severe persistent asthma, uncomplicated: Secondary | ICD-10-CM | POA: Diagnosis not present

## 2020-12-25 ENCOUNTER — Other Ambulatory Visit: Payer: Self-pay

## 2020-12-25 ENCOUNTER — Ambulatory Visit (INDEPENDENT_AMBULATORY_CARE_PROVIDER_SITE_OTHER): Payer: Medicare Other

## 2020-12-25 DIAGNOSIS — J4550 Severe persistent asthma, uncomplicated: Secondary | ICD-10-CM

## 2021-02-18 DIAGNOSIS — J4550 Severe persistent asthma, uncomplicated: Secondary | ICD-10-CM | POA: Diagnosis not present

## 2021-02-19 ENCOUNTER — Ambulatory Visit (INDEPENDENT_AMBULATORY_CARE_PROVIDER_SITE_OTHER): Payer: Medicare Other | Admitting: *Deleted

## 2021-02-19 ENCOUNTER — Other Ambulatory Visit: Payer: Self-pay

## 2021-02-19 DIAGNOSIS — J4550 Severe persistent asthma, uncomplicated: Secondary | ICD-10-CM | POA: Diagnosis not present

## 2021-04-02 ENCOUNTER — Encounter (HOSPITAL_COMMUNITY): Payer: Self-pay

## 2021-04-15 DIAGNOSIS — J4550 Severe persistent asthma, uncomplicated: Secondary | ICD-10-CM | POA: Diagnosis not present

## 2021-04-16 ENCOUNTER — Telehealth: Payer: Self-pay

## 2021-04-16 ENCOUNTER — Ambulatory Visit (INDEPENDENT_AMBULATORY_CARE_PROVIDER_SITE_OTHER): Payer: Medicare Other

## 2021-04-16 ENCOUNTER — Other Ambulatory Visit: Payer: Self-pay

## 2021-04-16 DIAGNOSIS — J4550 Severe persistent asthma, uncomplicated: Secondary | ICD-10-CM

## 2021-04-16 NOTE — Telephone Encounter (Signed)
Patient stopped by stating she is experiencing chest congestion and a lot of nasal mucous coming out of the left nostril. She was put on Amoxicillin 875 mg on 02/25/2021  and then Doxycycline 100 mg on 03/25/2021.   [facility]

## 2021-04-17 MED ORDER — PREDNISONE 10 MG PO TABS: ORAL_TABLET | ORAL | 0 refills | Status: DC

## 2021-04-17 NOTE — Telephone Encounter (Signed)
Called patient to let her know that the prednisone was sent into the pharmacy and to start Coricidin BID for 1 week in combination with prednisone. Also let patient know to call us back after she finishes her prednisone course to let us know if she is any better. If she is not a sinus CT would be the next step. Coricidin is an alternative to Mucinex since patient stated she can not take it due to her blood pressure issues. This change was authorized by [name].

## 2021-04-17 NOTE — Telephone Encounter (Signed)
Those are two good antibiotics. I do not think that [place] throwing more antibiotics at it are going to help.  I would recommend that she start Mucinex 1200mg  BID for one week in combination with a prednisone course. I would like to get a sinus CT if this does not work.  Salvatore Marvel, MD [facility]

## 2021-04-17 NOTE — Telephone Encounter (Signed)
Thank you so much for taking excellent care of our patients, Trayce!   Salvatore Marvel, MD [facility] [facility]

## 2021-05-12 MED ORDER — BREZTRI AEROSPHERE 160-9-4.8 MCG/ACT IN AERO: 2.0000 | INHALATION_SPRAY | Freq: Two times a day (BID) | RESPIRATORY_TRACT | 5 refills | Status: DC

## 2021-05-12 NOTE — Addendum Note (Signed)
Addended by: Clovis Cao A on: 05/12/2021 04:31 PM ? ? Modules accepted: Orders ? ?

## 2021-06-10 DIAGNOSIS — J4550 Severe persistent asthma, uncomplicated: Secondary | ICD-10-CM | POA: Diagnosis not present

## 2021-06-11 ENCOUNTER — Ambulatory Visit (INDEPENDENT_AMBULATORY_CARE_PROVIDER_SITE_OTHER): Payer: Medicare Other

## 2021-06-11 DIAGNOSIS — J4550 Severe persistent asthma, uncomplicated: Secondary | ICD-10-CM

## 2021-06-25 ENCOUNTER — Telehealth: Payer: Self-pay | Admitting: Family Medicine

## 2021-06-25 NOTE — Telephone Encounter (Signed)
Spoke with patient informed her that I will call AZ[employer]ME tomorrow and see what is going on with her medication. Patient verbalized understanding. ?

## 2021-06-25 NOTE — Telephone Encounter (Signed)
Patient called back to see if Alicia Hall had called the number that she provided. I advised patient she would need to bring the paperwork by the office or call the number to herself to approve medication. Patient states she has tried to call [employer]ME can never get anyone on the phone. I advised patient again, she can bring the paperwork by the office or try to call [employer]ME again and see if she can get anyone on the phone to assist her. Patient stated she will try to get someone to fax paperwork to our office some time this week.  ? ? ?

## 2021-06-25 NOTE — Telephone Encounter (Signed)
Alicia Hall called in and states she was approved for assistance from [employer]ME for [employer].  She has yes to receive any Alicia Hall but refuses to bring the paperwork to the office or call the phone number to find out what is going on and why her medication is delayed.  Alicia Hall seems to think we need to send over a prescription to AZ[employer]ME.  I am not sure how this process works.  I asked her to bring the paperwork by and she refused.  Alicia Hall asked her to bring the paperwork by, she refused, Alicia Hall asked her to fax the paperwork, she refused, Alicia Hall asked her to take pictures of the paperwork and she refused.  Alicia Hall wants [place] to call and find out what the issue is.  [phone].  Please advise.  ?

## 2021-06-27 NOTE — Telephone Encounter (Signed)
Spoke with [employer]ME, the lady informed me that they have located her prescription and will get her medication shipped out. She stated it will take 6-8 business day. Called patient and informed her that her medication is being shipped and will arrive in 6-8 business day. Patient asked if we had a sample until her medication was delivered. Sample has been placed up front for pick up. Patient verbalized understanding and greatly appreciated someone taking the time to look into this matter.   ?

## 2021-08-14 DIAGNOSIS — J4550 Severe persistent asthma, uncomplicated: Secondary | ICD-10-CM | POA: Diagnosis not present

## 2021-08-15 ENCOUNTER — Ambulatory Visit (INDEPENDENT_AMBULATORY_CARE_PROVIDER_SITE_OTHER): Payer: Medicare Other

## 2021-08-15 DIAGNOSIS — J4550 Severe persistent asthma, uncomplicated: Secondary | ICD-10-CM | POA: Diagnosis not present

## 2021-10-10 ENCOUNTER — Ambulatory Visit (INDEPENDENT_AMBULATORY_CARE_PROVIDER_SITE_OTHER): Payer: Medicare Other | Admitting: *Deleted

## 2021-10-10 DIAGNOSIS — J4550 Severe persistent asthma, uncomplicated: Secondary | ICD-10-CM | POA: Diagnosis not present

## 2021-12-04 DIAGNOSIS — J4550 Severe persistent asthma, uncomplicated: Secondary | ICD-10-CM | POA: Diagnosis not present

## 2021-12-05 ENCOUNTER — Ambulatory Visit (INDEPENDENT_AMBULATORY_CARE_PROVIDER_SITE_OTHER): Payer: Medicare Other

## 2021-12-05 DIAGNOSIS — J4550 Severe persistent asthma, uncomplicated: Secondary | ICD-10-CM | POA: Diagnosis not present

## 2022-01-30 ENCOUNTER — Ambulatory Visit: Payer: Medicare Other

## 2022-01-30 ENCOUNTER — Ambulatory Visit (INDEPENDENT_AMBULATORY_CARE_PROVIDER_SITE_OTHER): Payer: Medicare Other

## 2022-01-30 DIAGNOSIS — J4550 Severe persistent asthma, uncomplicated: Secondary | ICD-10-CM | POA: Diagnosis not present

## 2022-03-27 ENCOUNTER — Ambulatory Visit: Payer: Medicare Other | Admitting: Allergy & Immunology

## 2022-03-27 ENCOUNTER — Encounter: Payer: Self-pay | Admitting: Allergy & Immunology

## 2022-03-27 ENCOUNTER — Ambulatory Visit: Payer: Medicare Other

## 2022-03-27 ENCOUNTER — Other Ambulatory Visit: Payer: Self-pay

## 2022-03-27 ENCOUNTER — Ambulatory Visit (INDEPENDENT_AMBULATORY_CARE_PROVIDER_SITE_OTHER): Payer: Medicare Other | Admitting: Allergy & Immunology

## 2022-03-27 VITALS — BP 138/80 | HR 87 | Temp 98.0°F | Resp 20 | Ht 62.0 in | Wt 195.0 lb

## 2022-03-27 DIAGNOSIS — J4550 Severe persistent asthma, uncomplicated: Secondary | ICD-10-CM | POA: Diagnosis not present

## 2022-03-27 DIAGNOSIS — J3089 Other allergic rhinitis: Secondary | ICD-10-CM

## 2022-03-27 IMAGING — MG MM BREAST BX W LOC DEV 1ST LESION IMAGE BX SPEC STEREO GUIDE*L*
8 of 23 series · 8 of 40 positions shown · non-contrast
Comparison: Previous exams.
COMPARISON: Previous exams.

Addendum:
CLINICAL DATA: 1.1 cm group of indeterminate calcifications in the
upper outer left breast at recent mammography.

EXAM:
LEFT BREAST STEREOTACTIC CORE NEEDLE BIOPSY

[L (1 of 8)]
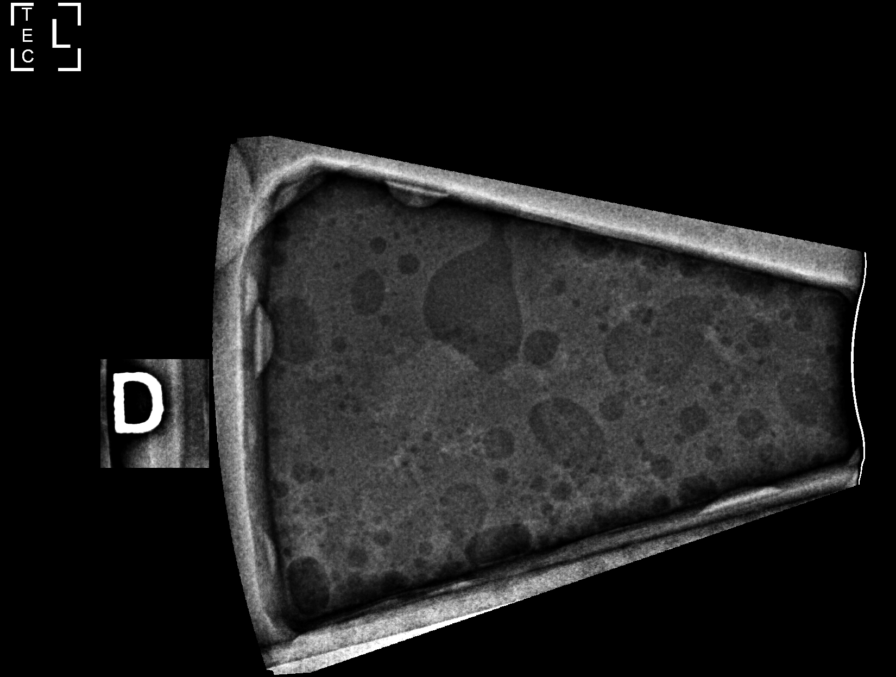

[L (2 of 8)]
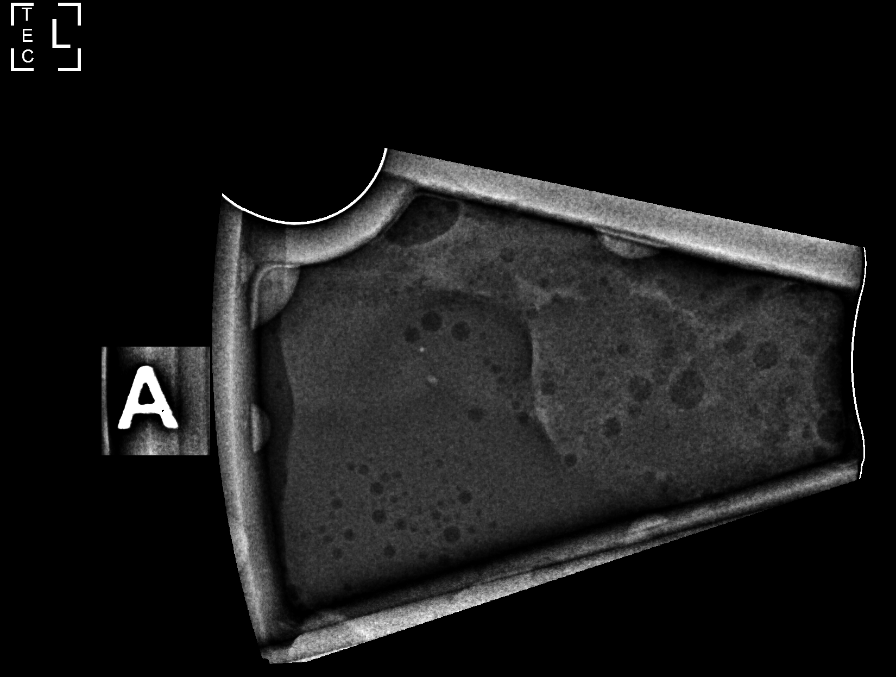

[L (3 of 8)]
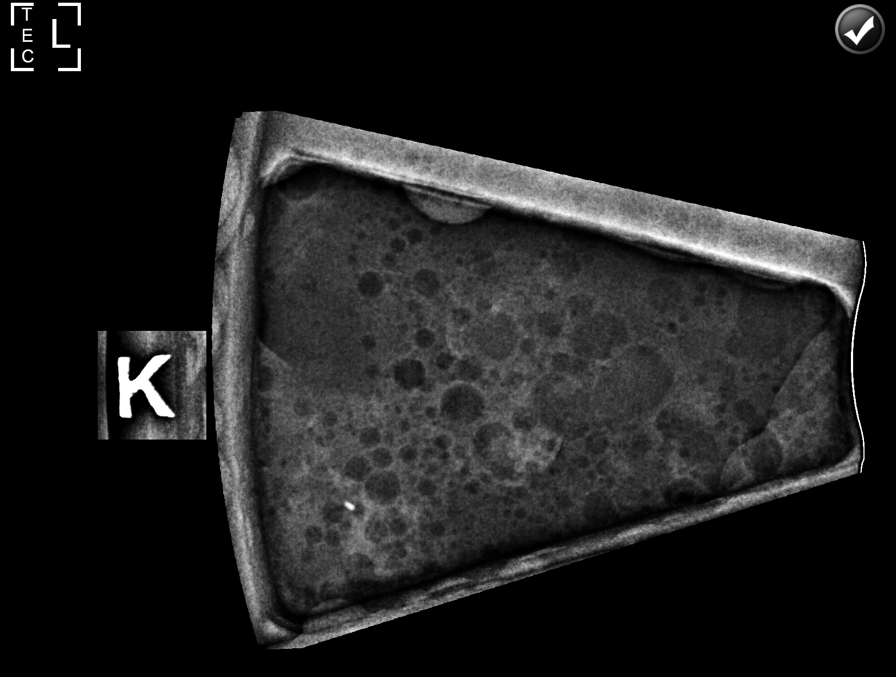

[L (4 of 8)]
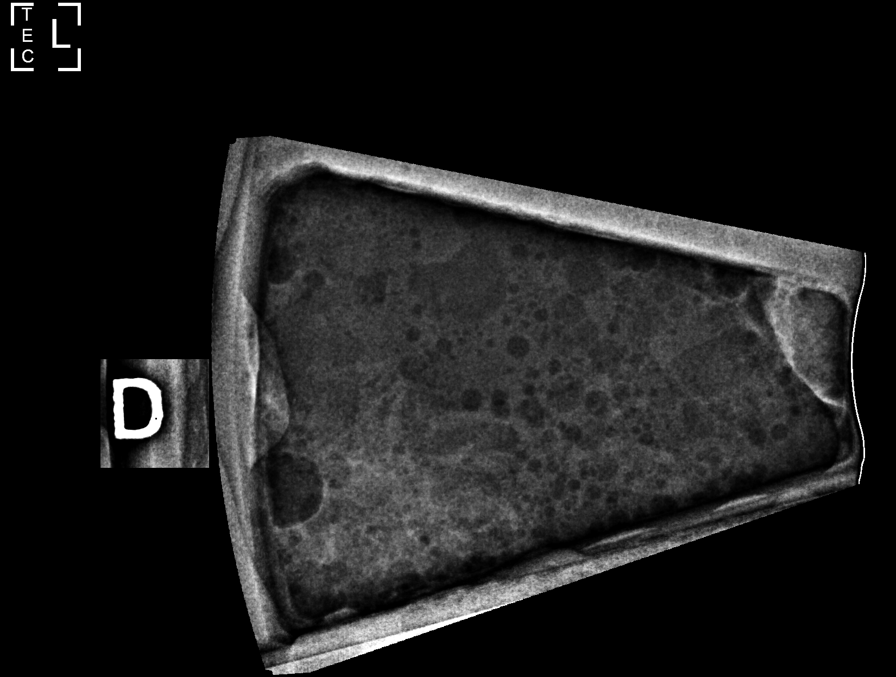

[L (5 of 8)]
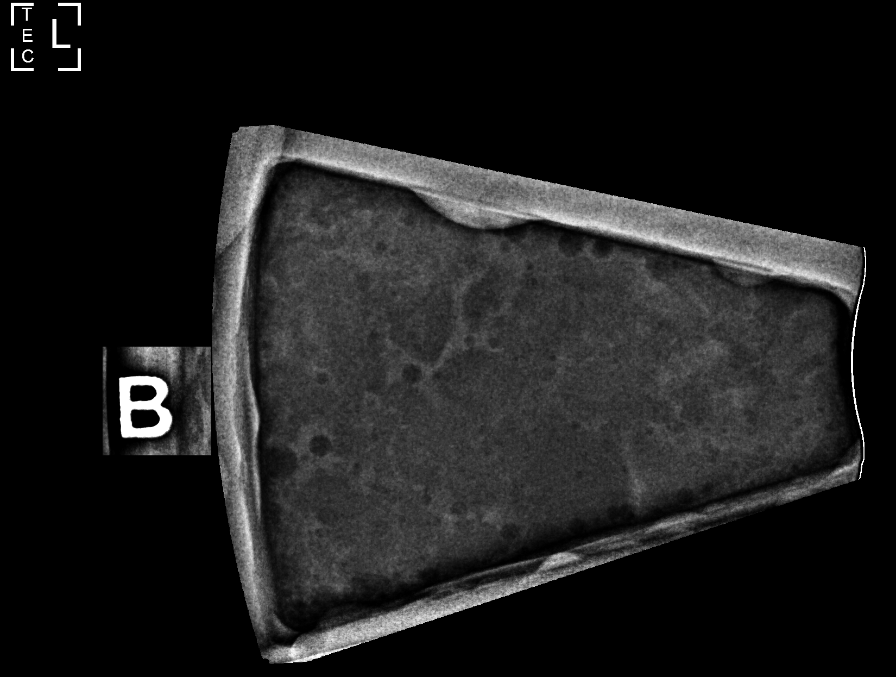

[L (6 of 8)]
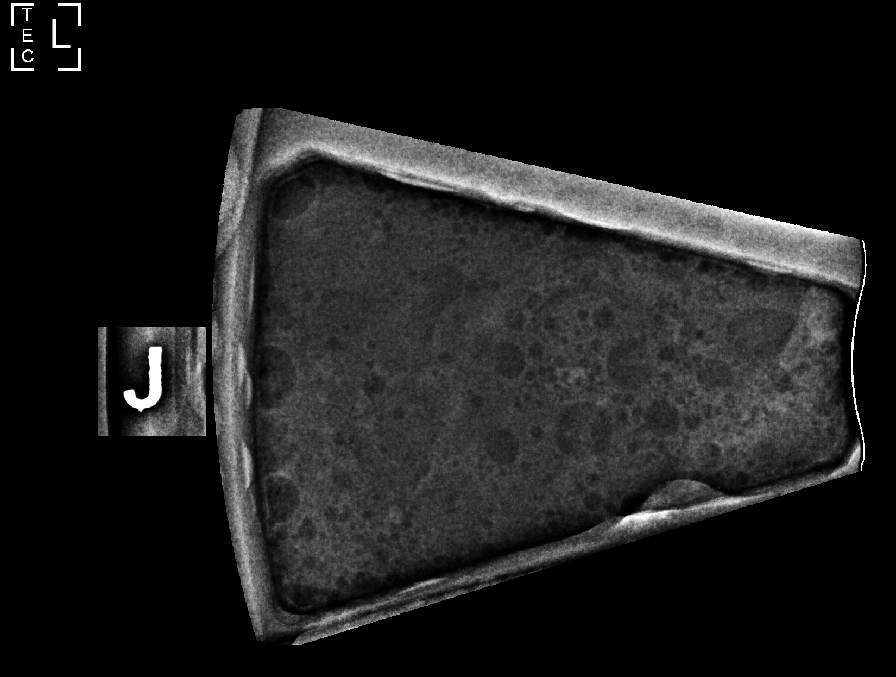

[L (7 of 8)]
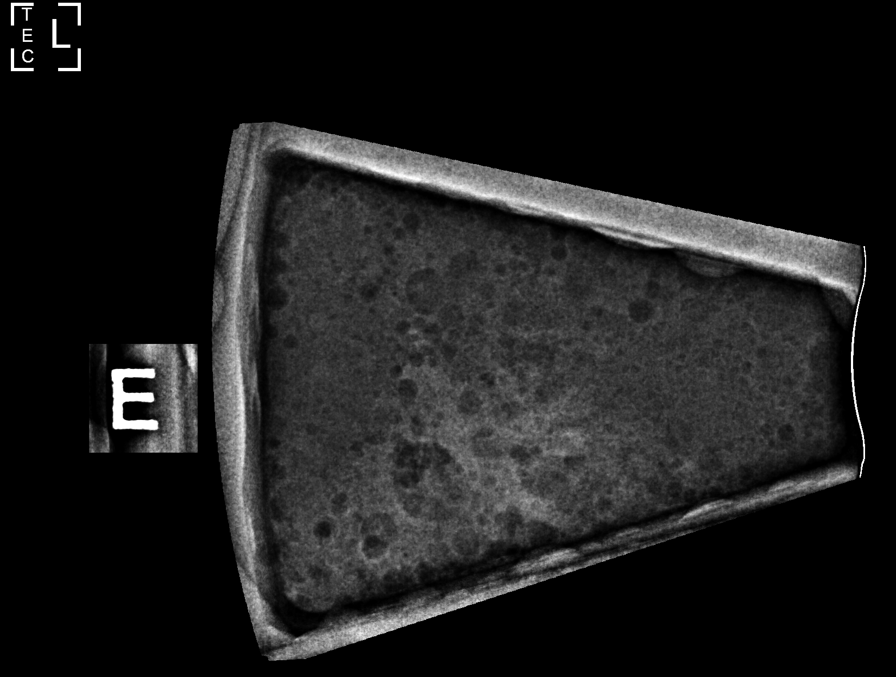

[L (8 of 8)]
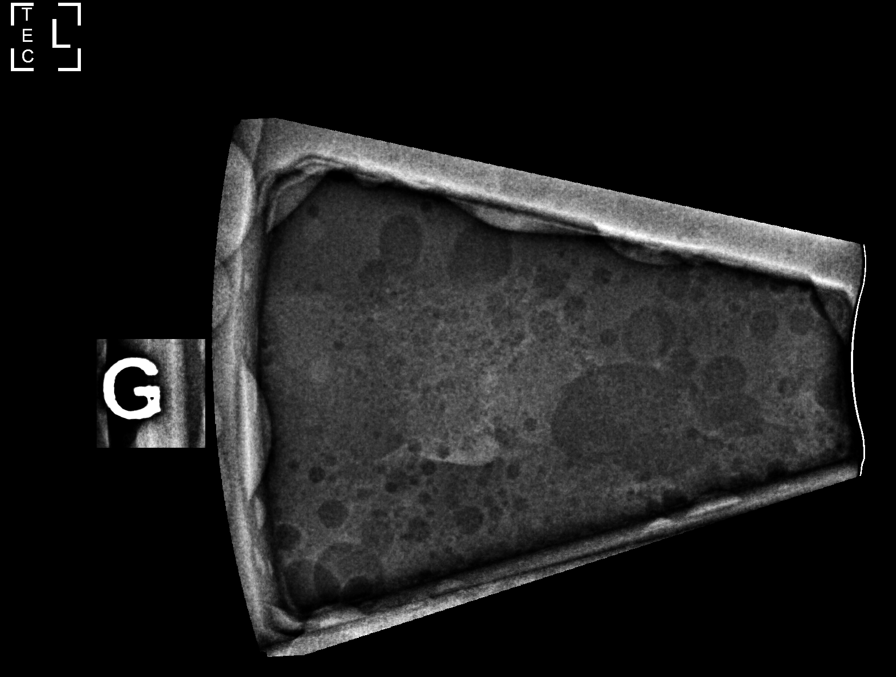

[8 of 40 positions shown; findings below may reference images not displayed]



Using sterile technique and 1% Lidocaine as local anesthetic, under
stereotactic guidance, a 9 gauge vacuum assisted device was used to
perform core needle biopsy of the recently demonstrated 1.1 cm group
indeterminate calcifications in the upper-outer left breast using a
lateral approach. Specimen radiograph was performed showing
calcifications in multiple specimens. Specimens with calcifications
are identified for pathology.

Lesion quadrant: Upper outer quadrant

At the conclusion of the procedure, a coil shaped tissue marker clip
was deployed into the biopsy cavity. Follow-up 2-view mammogram was
performed and dictated separately.
IMPRESSION: Stereotactic-guided biopsy of the recently demonstrated 1.1 cm group
of calcifications in the upper-outer left breast. No apparent
complications.

ADDENDUM:
Pathology revealed HIGH GRADE DUCTAL CARCINOMA IN SITU,
CALCIFICATIONS of the LEFT breast, upper outer quadrant. This was
found to be concordant by Dr. Ourari Tiger.

Pathology results were discussed with the patient and Vanacker Byl
Odd-Martin by telephone. The patient reported doing well after the
biopsy with tenderness at the site. Post biopsy instructions and
care were reviewed and questions were answered. The patient was
encouraged to call The [REDACTED] for any
additional concerns.

Pathology report faxed to Dr. Ai Menard of [[facility]ACTED] [[facility]ACTED] in [HOSPITAL]. Pathology results called to [facility]
Leshla Malque . As requested, patient was contacted by Provider
regarding surgical referral.

Per patient request on [DATE], surgical consultation has been
arranged with Dr. Zqck Karthi at [REDACTED] on
October 02, 2019.

Consider breast MRI for extent given High Grade histology. The
biopsy clip migrated, so the residual calcifications in that group
will need to be localized.

Pathology results reported by Gauri Anders RN on 09/21/2019.



Using sterile technique and 1% Lidocaine as local anesthetic, under
stereotactic guidance, a 9 gauge vacuum assisted device was used to
perform core needle biopsy of the recently demonstrated 1.1 cm group
indeterminate calcifications in the upper-outer left breast using a
lateral approach. Specimen radiograph was performed showing
calcifications in multiple specimens. Specimens with calcifications
are identified for pathology.

Lesion quadrant: Upper outer quadrant

At the conclusion of the procedure, a coil shaped tissue marker clip
was deployed into the biopsy cavity. Follow-up 2-view mammogram was
performed and dictated separately.
IMPRESSION: Stereotactic-guided biopsy of the recently demonstrated 1.1 cm group
of calcifications in the upper-outer left breast. No apparent
complications.

## 2022-03-27 IMAGING — MG MM BREAST LOCALIZATION CLIP
4 series · 4 of 12 positions shown · non-contrast
Comparison: Previous exam(s).

CLINICAL DATA: Status post stereotactic guided core needle biopsy
of a 1.1 cm group of calcifications in the upper-outer left breast.

EXAM:
DIAGNOSTIC LEFT MAMMOGRAM POST STEREOTACTIC BIOPSY

[L LM synth-2D]
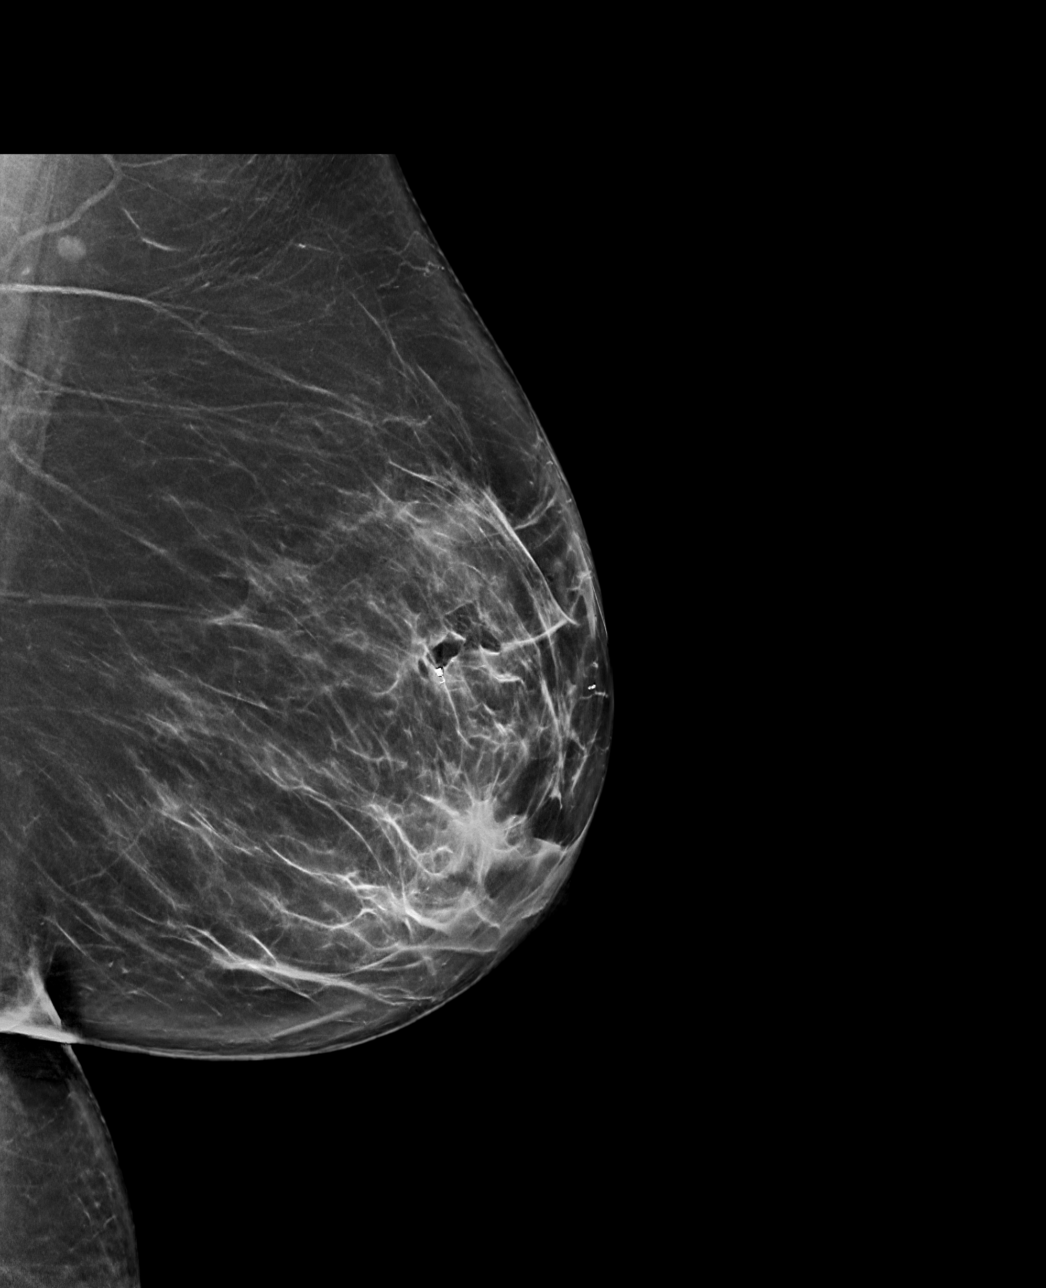

[L CC synth-2D]
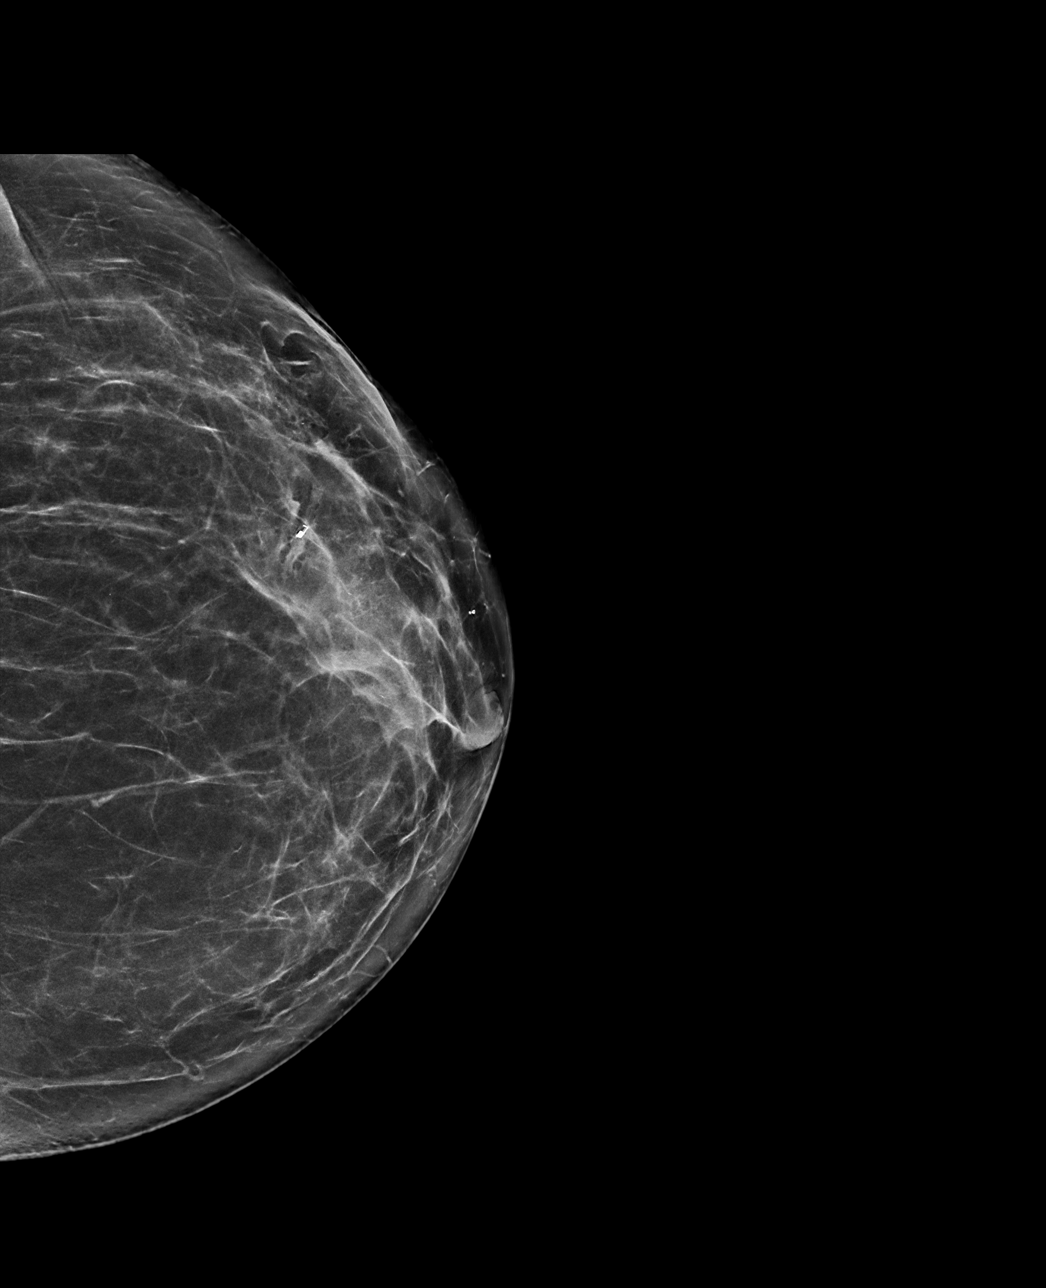

[L LM tomo · tomo slice 41/81.0]
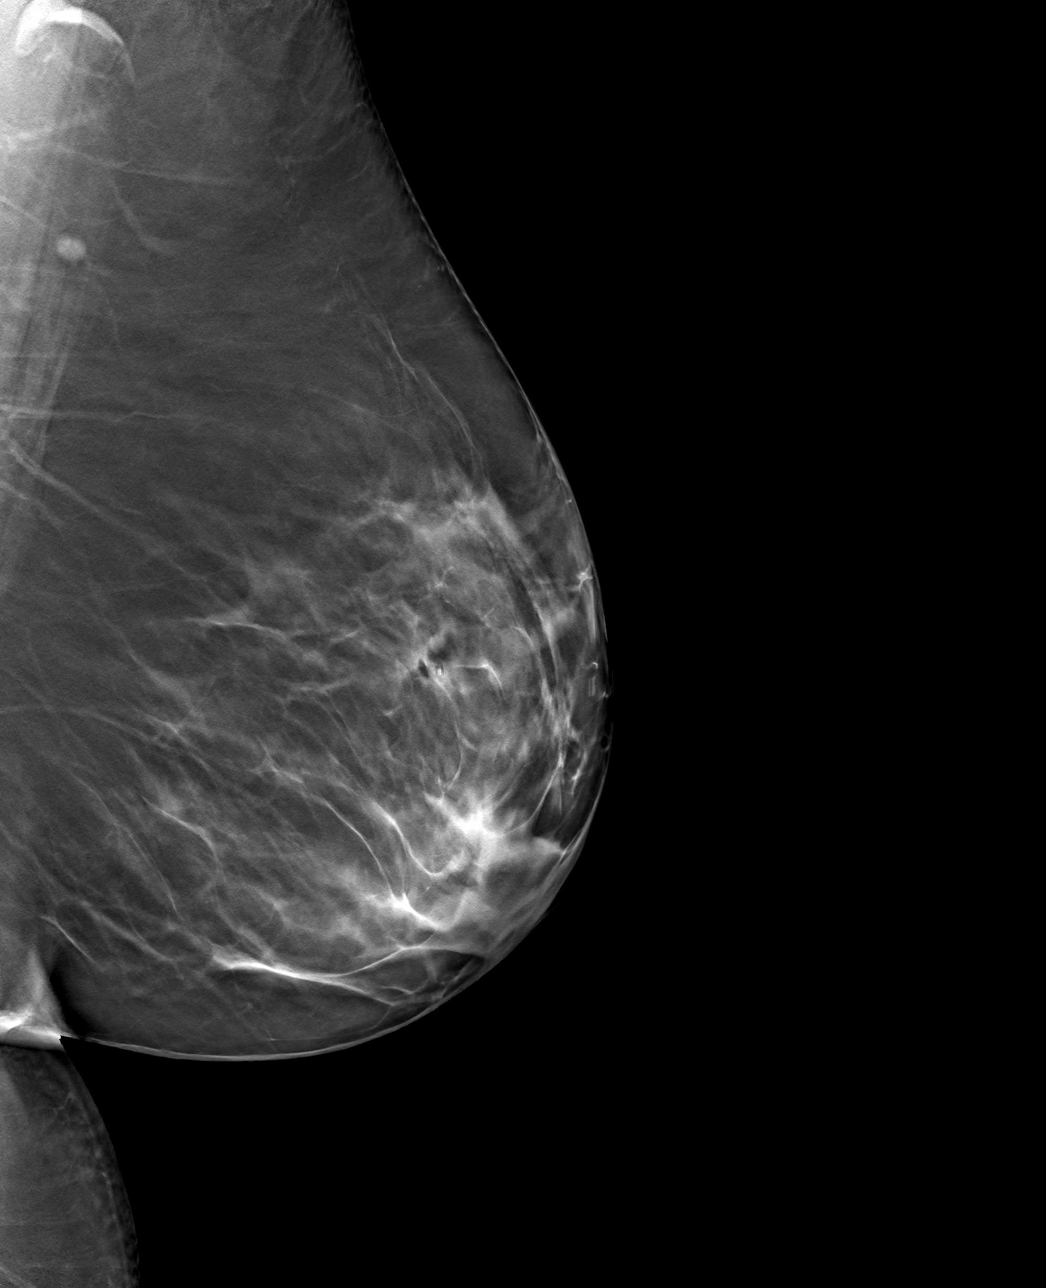

[L CC tomo · tomo slice 38/75.0]
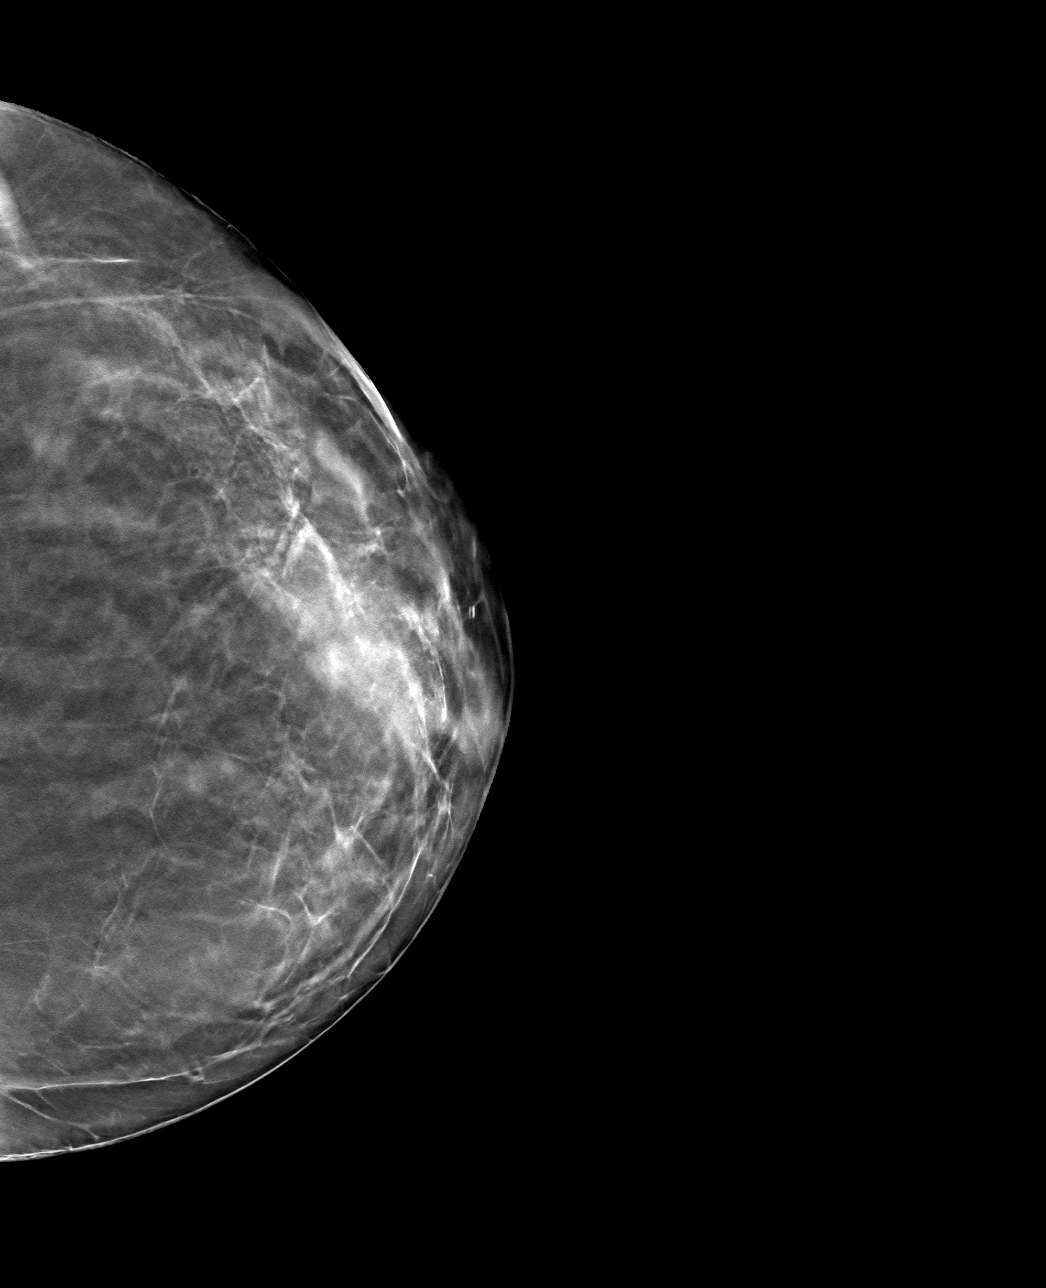

[4 of 12 positions shown; findings below may reference images not displayed]

FINDINGS: Mammographic images were obtained following stereotactic guided
biopsy of the recently demonstrated 1.1 cm group of calcifications
in the upper-outer left breast. The biopsy marking clip is 2.1 cm
medial to the biopsied calcifications with post biopsy changes at
the location of the calcifications. A small number of calcifications
are absent following biopsy.
IMPRESSION: Coil shaped biopsy marker clip 2.1 cm medial to the biopsied
calcifications in the upper outer left breast.

Final Assessment: Post Procedure Mammograms for Marker Placement

## 2022-03-27 MED ORDER — ALBUTEROL SULFATE HFA 108 (90 BASE) MCG/ACT IN AERS: 2.0000 | INHALATION_SPRAY | Freq: Four times a day (QID) | RESPIRATORY_TRACT | 2 refills | Status: DC | PRN

## 2022-03-27 MED ORDER — MONTELUKAST SODIUM 10 MG PO TABS: 10.0000 mg | ORAL_TABLET | Freq: Every day | ORAL | 5 refills | Status: DC

## 2022-03-27 NOTE — Patient Instructions (Addendum)
1. Asthma-COPD overlap syndrome - Lung function looked a bit worse today and it did NOT improve with the albuterol puffs. - We are going to send in a spacer to the pharmacy (we do not have adult sizes here today). - Use the spacer EVERY time!  - I will talk to Tammy about switching back to Nucala.  - We are not going to make any changes at this time.  - Daily controller medication(s): Singulair '10mg'$  daily + Breztri two puffs 1-2 times daily with spacer + Fasenra every 8 weeks     - Prior to physical activity: albuterol 2 puffs 10-15 minutes before physical activity. - Rescue medications: albuterol 4 puffs every 4-6 hours as needed or albuterol nebulizer one vial every 4-6 hours as needed - Changes during respiratory infections or worsening symptoms: Add on Asmanex 183mg to 2 puffs twice daily for ONE TO TWO WEEKS. - Asthma control goals:  * Full participation in all desired activities (may need albuterol before activity) * Albuterol use two time or less a week on average (not counting use with activity) * Cough interfering with sleep two time or less a month * Oral steroids no more than once a year. * No hospitalizations  2. Seasonal and perennial allergic rhinitis - Continue with Nasacort 1-2 sprays per nostril up to twice daily as needed.  - Continue with cetirizine '10mg'$  daily. - Continue with montelukast (Singulair) '10mg'$  daily.   3. Return in about 3 months (around 06/25/2022).    Please inform u[place]of any Emergency Department visits, hospitalizations, or changes in symptoms. Call u[place]abefore going to the ED for breathing or allergy symptoms since we might be able to fit you in for a sick visit. Feel free to contact u[place]anytime with any questions, problems, or concerns. 2 It was a pleasure to see you again today!  Websites that have reliable patient information: 1. American Academy of Asthma, Allergy, and Immunology: www.aaaai.org 2. Food Allergy Research and Education (FARE):  foodallergy.org 3. Mothers of Asthmatics: http://www.asthmacommunitynetwork.org 4. American College of Allergy, Asthma, and Immunology: www.acaai.org   COVID-19 Vaccine Information can be found at: [email or web address] questions related to vaccine distribution or appointments, please email [email or web address] or call [phone]     "Like" u[place]on Facebook and Instagram for our latest updates!       Make sure you are registered to vote! If you have moved or changed any of your contact information, you will need to get this updated before voting!  In some cases, you MAY be able to register to vote online: [email or web address]

## 2022-03-27 NOTE — Progress Notes (Unsigned)
FOLLOW UP  Date of Service/Encounter:  03/27/22   Assessment:   Asthma-COPD overlap syndrome - with eosinophilic phenotype on Fasenra   Seasonal and perennial allergic rhinitis (grasses, weeds, trees, molds, dust mites, cat, horse, and cockroach)   Pulmonary nodules - followed by Dr. Chase Caller (last chest CT October 2018 stable)   DCIS - s/p radiation and resection with 5 years of hormonal therapy  Plan/Recommendations:   1. Asthma-COPD overlap syndrome - Lung function looked a bit worse today and it did NOT improve with the albuterol puffs. - We are going to send in a spacer to the pharmacy (we do not have adult sizes here today). - Use the spacer EVERY time!  - I will talk to Tammy about switching back to Nucala.  - We are not going to make any changes at this time.  - Daily controller medication(s): Singulair '10mg'$  daily + Breztri two puffs 1-2 times daily with spacer + Fasenra every 8 weeks     - Prior to physical activity: albuterol 2 puffs 10-15 minutes before physical activity. - Rescue medications: albuterol 4 puffs every 4-6 hours as needed or albuterol nebulizer one vial every 4-6 hours as needed - Changes during respiratory infections or worsening symptoms: Add on Asmanex 132mg to 2 puffs twice daily for ONE TO TWO WEEKS. - Asthma control goals:  * Full participation in all desired activities (may need albuterol before activity) * Albuterol use two time or less a week on average (not counting use with activity) * Cough interfering with sleep two time or less a month * Oral steroids no more than once a year. * No hospitalizations  2. Seasonal and perennial allergic rhinitis - Continue with Nasacort 1-2 sprays per nostril up to twice daily as needed.  - Continue with cetirizine '10mg'$  daily. - Continue with montelukast (Singulair) '10mg'$  daily.   3. Return in about 3 months (around 06/25/2022).   Subjective:   Alicia FLIPPINis a 67y.o. female presenting today for  follow up of  Chief Complaint  Patient presents with   Follow-up    Yearly follow up pt states she is doing pretty good still has a little congestion.    Alicia Hall has a history of the following: Patient Active Problem List   Diagnosis Date Noted   Malignant neoplasm of upper-outer quadrant of left breast in female, estrogen receptor positive ([place] 09/27/2019   Pulmonary nodules 09/22/2016   Asthma-COPD overlap syndrome 07/28/2016   COPD, moderate ([facility] 07/05/2015   Hypersomnia with sleep apnea 06/19/2015   OSA (obstructive sleep apnea) 06/19/2015   Depression 06/19/2015   Insomnia 06/19/2015   Lung nodule 03/08/2015   History of colonic polyps    GERD (gastroesophageal reflux disease) 01/25/2015   Dysphagia 01/25/2015   Encounter for screening colonoscopy 01/25/2015   COPD (chronic obstructive pulmonary disease) [place] 12/18/2014   Moderate persistent asthma 11/23/2014   Dyspnea and respiratory abnormality 11/23/2014   Smoking history 11/23/2014   Upper airway cough syndrome 11/23/2014   Perennial allergic rhinitis 11/15/2014    History obtained from: chart review and patient.  PKerinais a 67y.o. female presenting for a follow up visit.  She was last seen in September 2022.  At that time, her lung function looks stable.  We did not make any changes.  We continue with Singulair as well as Breztri 2 puffs twice daily and Fasenra every 8 weeks.  She has Asmanex that she has during flares.  For her  rhinitis, we continue with Nasacort as well as montelukast and cetirizine.  Since last visit, she is doing very well.  Asthma/Respiratory Symptom History: She has not been needing her albuterol at all. She had breathing problems for a few weeks at that point. It was not too long. She was using her father's nebulizer machine, but it has since looked bad.  She has been on Breztri and is doing [place] and [place]. This is covering her medications. She loved the Nucala and felt that it did better  for her. She did have prednisone last year when she had her asthma exacerbation. She was trying to keep it all in one place.   Her EpiPen is out of date.  She only had it because of the Nenana.  She is fine not keeping it on board.  She does have a history of pulmonary nodules.  Her last chest CT was in December 2022 and showed the following:  IMPRESSION:  1. Small bilateral lung nodules, unchanged from 2018 and considered  benign.  2. Aortic Atherosclerosis (ICD10-I70.0) and Emphysema (ICD10-J43.9).   Allergic Rhinitis Symptom History: She does get congested nearly every day. She had antibiotics last year at one point. But overall her symptoms have been well controlled.   Otherwise, there have been no changes to her past medical history, surgical history, family history, or social history.    ROS     Objective:   Blood pressure 138/80, pulse 87, temperature 98 F (36.7 C), resp. rate 20, height '5\' 2"'$  (1.575 m), weight 195 lb (88.5 kg), SpO2 95 %. Body mass index is 35.67 kg/m.    Physical Exam Vitals reviewed.  Constitutional:      Appearance: She is well-developed.     Comments: Talkative.   HENT:     Head: Normocephalic and atraumatic.     Right Ear: Tympanic membrane, ear canal and external ear normal.     Left Ear: Tympanic membrane, ear canal and external ear normal.     Nose: No nasal deformity, septal deviation, mucosal edema or rhinorrhea.     Right Turbinates: Enlarged, swollen and pale.     Left Turbinates: Enlarged, swollen and pale.     Right Sinus: No maxillary sinus tenderness or frontal sinus tenderness.     Left Sinus: No maxillary sinus tenderness or frontal sinus tenderness.     Comments: No nasal polyps.    Mouth/Throat:     Mouth: Mucous membranes are not pale and not dry.     Pharynx: Uvula midline.  Eyes:     General: Lids are normal. No allergic shiner.       Right eye: No discharge.        Left eye: No discharge.     Conjunctiva/sclera:  Conjunctivae normal.     Right eye: Right conjunctiva is not injected. No chemosis.    Left eye: Left conjunctiva is not injected. No chemosis.    Pupils: Pupils are equal, round, and reactive to light.  Cardiovascular:     Rate and Rhythm: Normal rate and regular rhythm.     Heart sounds: Normal heart sounds.  Pulmonary:     Effort: Pulmonary effort is normal. No tachypnea, accessory muscle usage or respiratory distress.     Breath sounds: Normal breath sounds. No wheezing, rhonchi or rales.     Comments: Moving air well in all lung fields.  No increased work of breathing. Chest:     Chest wall: No tenderness.  Lymphadenopathy:  Cervical: No cervical adenopathy.  Skin:    General: Skin is warm.     Capillary Refill: Capillary refill takes less than 2 seconds.     Coloration: Skin is not pale.     Findings: No abrasion, erythema, petechiae or rash. Rash is not papular, urticarial or vesicular.     Comments: No eczematous or urticarial lesions noted.  Neurological:     Mental Status: She is alert.  Psychiatric:        Behavior: Behavior is cooperative.      Diagnostic studies:    Spirometry: results abnormal (FEV1: 1.25/59%, FVC: 1.74/64%, FEV1/FVC: 72%).    Spirometry consistent with possible restrictive disease. Albuterol four puffs via MDI treatment given in clinic with no improvement.  Allergy Studies: none        Salvatore Marvel, MD  [facility]

## 2022-03-30 ENCOUNTER — Telehealth: Payer: Self-pay | Admitting: Allergy & Immunology

## 2022-03-30 ENCOUNTER — Encounter: Payer: Self-pay | Admitting: Allergy & Immunology

## 2022-03-30 NOTE — Telephone Encounter (Signed)
Alicia Hall called in and states her paper work tells her to take Asmanex but it wasn't called in so she wants to know if she should be taking that?  Alicia Hall also states she was supposed to have a spacer called in as well?  She would like the Asmanex and spacer sent to [facility] in [place].

## 2022-03-31 NOTE — Telephone Encounter (Signed)
Called patient - DOB verified - advised of provider notation below.  Patient verbalized understanding, no further questions. 

## 2022-03-31 NOTE — Telephone Encounter (Signed)
It looks like it is not covered at all. She is only supposed to add that during flares. Please just have her call us if she has a flare and we can just see her in the clinic and make a decision about what to do.   Salvatore Marvel, MD [facility] [place]

## 2022-05-05 NOTE — Progress Notes (Signed)
   Hall, Alicia [address] Dept: [phone]  FOLLOW UP NOTE  Patient ID: Alicia Hall, female    DOB: 12/24/1955  Age: 66 y.o. MRN: [id number] Date of Office Visit: 05/06/2022  Assessment  Chief Complaint: No chief complaint on file.  HPI Alicia Hall is a 67 year old female who presents to the clinic for evaluation of acute asthma symptoms.  She was last seen in this clinic on 03/27/2022 by [name] for evaluation of asthma COPD, allergic rhinitis, pulmonary nodule, and DICS status post resection, radiation, and hormonal therapy.   Drug Allergies:  Allergies  Allergen Reactions  . Levaquin [Levofloxacin]     nausea  . Lisinopril Cough    Physical Exam: There were no vitals taken for this visit.   Physical Exam  Diagnostics:    Assessment and Plan: No diagnosis found.  No orders of the defined types were placed in this encounter.   There are no Patient Instructions on file for this visit.  No follow-ups on file.    Thank you for the opportunity to care for this patient.  Please do not hesitate to contact me with questions.  Gareth Morgan, FNP [facility] [place]

## 2022-05-05 NOTE — Patient Instructions (Incomplete)
Asthma Prednisone 10 mg tablets. Take 2 tablets once a day for 4 days, then take 1 tablet on the 5th day, then stop.   Continue montelukast 10 mg once a day to prevent cough or wheeze Begin azithromycin 250 mg tablets. Take 2 tablets today and then take one tablet once a day for the next 4 days, then stop Continue Breztri 2 puffs twice a day with a spacer to prevent cough or wheeze Continue albuterol 2 puffs once every 4 hours as needed for cough or wheeze You may use albuterol 2 puffs 5 to 15 minutes before activity to decrease cough or wheeze Continue Fasenra injections once every 8 weeks per protocol for now. We will get you submitted to see if you can change to Nucala.  Allergic rhinitis Continue cetirizine 10 mg once a day as needed for runny nose or itch Continue Nasacort 2 sprays in each nostril once a day as needed for stuffy nose Consider saline nasal rinses as needed for nasal symptoms. Use this before any medicated nasal sprays for best result  Pulmonary nodules Continue to follow-up with Dr. Chase Caller as recommended  Reflux Continue dietary and lifestyle modifications as listed below Continue famotidine 20 mg twice a day to control reflux  Call the clinic if this treatment plan is not working well for you.  Follow up in 2 months or sooner if needed.   Lifestyle Changes for Controlling GERD When you have GERD, stomach acid feels as if it's backing up toward your mouth. Whether or not you take medication to control your GERD, your symptoms can often be improved with lifestyle changes.   Raise Your Head Reflux is more likely to strike when you're lying down flat, because stomach fluid can flow backward more easily. Raising the head of your bed 4-6 inches can help. To do this: Slide blocks or books under the legs at the head of your bed. Or, place a wedge under the mattress. Many foam stores can make a suitable wedge for you. The wedge should run from your waist to the top  of your head. Don't just prop your head on several pillows. This increases pressure on your stomach. It can make GERD worse.  Watch Your Eating Habits Certain foods may increase the acid in your stomach or relax the lower esophageal sphincter, making GERD more likely. It's best to avoid the following: Coffee, tea, and carbonated drinks (with and without caffeine) Fatty, fried, or spicy food Mint, chocolate, onions, and tomatoes Any other foods that seem to irritate your stomach or cause you pain  Relieve the Pressure Eat smaller meals, even if you have to eat more often. Don't lie down right after you eat. Wait a few hours for your stomach to empty. Avoid tight belts and tight-fitting clothes. Lose excess weight.  Tobacco and Alcohol Avoid smoking tobacco and drinking alcohol. They can make GERD symptoms worse.

## 2022-05-06 ENCOUNTER — Encounter: Payer: Self-pay | Admitting: Family Medicine

## 2022-05-06 ENCOUNTER — Other Ambulatory Visit: Payer: Self-pay

## 2022-05-06 ENCOUNTER — Ambulatory Visit (INDEPENDENT_AMBULATORY_CARE_PROVIDER_SITE_OTHER): Payer: Medicare Other | Admitting: Family Medicine

## 2022-05-06 VITALS — BP 188/70 | HR 79 | Temp 98.3°F | Resp 20

## 2022-05-06 DIAGNOSIS — K219 Gastro-esophageal reflux disease without esophagitis: Secondary | ICD-10-CM | POA: Diagnosis not present

## 2022-05-06 DIAGNOSIS — J4541 Moderate persistent asthma with (acute) exacerbation: Secondary | ICD-10-CM | POA: Diagnosis not present

## 2022-05-06 DIAGNOSIS — J3089 Other allergic rhinitis: Secondary | ICD-10-CM

## 2022-05-06 MED ORDER — AZITHROMYCIN 250 MG PO TABS: ORAL_TABLET | ORAL | 0 refills | Status: DC

## 2022-05-06 MED ORDER — PREDNISONE 10 MG PO TABS: ORAL_TABLET | ORAL | 0 refills | Status: DC

## 2022-05-25 ENCOUNTER — Encounter: Payer: Self-pay | Admitting: Family Medicine

## 2022-05-25 ENCOUNTER — Other Ambulatory Visit: Payer: Self-pay

## 2022-05-25 ENCOUNTER — Ambulatory Visit: Payer: Medicare Other

## 2022-05-25 ENCOUNTER — Ambulatory Visit (INDEPENDENT_AMBULATORY_CARE_PROVIDER_SITE_OTHER): Payer: Medicare Other | Admitting: Family Medicine

## 2022-05-25 VITALS — BP 168/60 | HR 79 | Temp 98.2°F | Resp 18

## 2022-05-25 DIAGNOSIS — J4550 Severe persistent asthma, uncomplicated: Secondary | ICD-10-CM | POA: Diagnosis not present

## 2022-05-25 DIAGNOSIS — K219 Gastro-esophageal reflux disease without esophagitis: Secondary | ICD-10-CM | POA: Diagnosis not present

## 2022-05-25 DIAGNOSIS — J3089 Other allergic rhinitis: Secondary | ICD-10-CM | POA: Diagnosis not present

## 2022-05-25 MED ORDER — OMEPRAZOLE 20 MG PO CPDR: 20.0000 mg | DELAYED_RELEASE_CAPSULE | Freq: Every day | ORAL | 5 refills | Status: DC

## 2022-05-25 NOTE — Progress Notes (Unsigned)
   [place], [place] [address] Dept: [phone]  FOLLOW UP NOTE  Patient ID: Alicia Hall, female    DOB: [birth year]  Age: 67 y.o. MRN: [id number] Date of Office Visit: 05/25/2022  Assessment  Chief Complaint: No chief complaint on file.  HPI Alicia Hall is a 67 year old female who presents to the clinic for a follow up visit. She was last seen in this clinic on 05/06/2022 by Gareth Morgan, FNP, for evaluation of asthma with acute exacerbation requiring a prednisone taper and azithromycin for resolution/.    Drug Allergies:  Allergies  Allergen Reactions   Levaquin [Levofloxacin]     nausea   Lisinopril Cough    Physical Exam: There were no vitals taken for this visit.   Physical Exam  Diagnostics: FVC 2.19, FEV1 1.45.  Predicted FVC 2.70, predicted FEV1 2.11.  Spirometry indicates normal ventilatory function.  Assessment and Plan: No diagnosis found.  No orders of the defined types were placed in this encounter.   There are no Patient Instructions on file for this visit.  No follow-ups on file.    Thank you for the opportunity to care for this patient.  Please do not hesitate to contact me with questions.  Gareth Morgan, FNP [facility] [place] [place]

## 2022-05-25 NOTE — Patient Instructions (Addendum)
Asthma Continue Breztri 2 puffs twice a day with a spacer to prevent cough or wheeze Continue albuterol 2 puffs once every 4 hours as needed for cough or wheeze You may use albuterol 2 puffs 5 to 15 minutes before activity to decrease cough or wheeze Continue Fasenra injections once every 8 weeks per protocol for now. We will get you submitted to see if you can change to Nucala.  Allergic rhinitis Continue allergen avoidance measures directed toward molds as listed below Continue cetirizine 10 mg once a day as needed for runny nose or itch Continue Nasacort 2 sprays in each nostril once a day as needed for stuffy nose Consider saline nasal rinses as needed for nasal symptoms. Use this before any medicated nasal sprays for best result  Pulmonary nodules Continue to follow-up with Dr. Chase Caller as recommended  Reflux Continue dietary and lifestyle modifications as listed below Continue famotidine 20 mg twice a day to control reflux Begin omeprazole 20 mg once a day. Take this medication 30 minutes before your first meal  Call the clinic if this treatment plan is not working well for you.  Follow up in 2 months or sooner if needed.  I suspect current symptoms are related to uncontrolled reflux.  We will try omeprazole for 30 days and reassess.  Lifestyle Changes for Controlling GERD When you have GERD, stomach acid feels as if it's backing up toward your mouth. Whether or not you take medication to control your GERD, your symptoms can often be improved with lifestyle changes.   Raise Your Head Reflux is more likely to strike when you're lying down flat, because stomach fluid can flow backward more easily. Raising the head of your bed 4-6 inches can help. To do this: Slide blocks or books under the legs at the head of your bed. Or, place a wedge under the mattress. Many foam stores can make a suitable wedge for you. The wedge should run from your waist to the top of your head. Don't  just prop your head on several pillows. This increases pressure on your stomach. It can make GERD worse.  Watch Your Eating Habits Certain foods may increase the acid in your stomach or relax the lower esophageal sphincter, making GERD more likely. It's best to avoid the following: Coffee, tea, and carbonated drinks (with and without caffeine) Fatty, fried, or spicy food Mint, chocolate, onions, and tomatoes Any other foods that seem to irritate your stomach or cause you pain  Relieve the Pressure Eat smaller meals, even if you have to eat more often. Don't lie down right after you eat. Wait a few hours for your stomach to empty. Avoid tight belts and tight-fitting clothes. Lose excess weight.  Tobacco and Alcohol Avoid smoking tobacco and drinking alcohol. They can make GERD symptoms worse.  Lifestyle Changes for Controlling GERD When you have GERD, stomach acid feels as if it's backing up toward your mouth. Whether or not you take medication to control your GERD, your symptoms can often be improved with lifestyle changes.   Raise Your Head Reflux is more likely to strike when you're lying down flat, because stomach fluid can flow backward more easily. Raising the head of your bed 4-6 inches can help. To do this: Slide blocks or books under the legs at the head of your bed. Or, place a wedge under the mattress. Many foam stores can make a suitable wedge for you. The wedge should run from your waist to the top of your  head. Don't just prop your head on several pillows. This increases pressure on your stomach. It can make GERD worse.  Watch Your Eating Habits Certain foods may increase the acid in your stomach or relax the lower esophageal sphincter, making GERD more likely. It's best to avoid the following: Coffee, tea, and carbonated drinks (with and without caffeine) Fatty, fried, or spicy food Mint, chocolate, onions, and tomatoes Any other foods that seem to irritate your  stomach or cause you pain  Relieve the Pressure Eat smaller meals, even if you have to eat more often. Don't lie down right after you eat. Wait a few hours for your stomach to empty. Avoid tight belts and tight-fitting clothes. Lose excess weight.  Tobacco and Alcohol Avoid smoking tobacco and drinking alcohol. They can make GERD symptoms worse.

## 2022-05-26 ENCOUNTER — Encounter: Payer: Self-pay | Admitting: Family Medicine

## 2022-05-26 DIAGNOSIS — J4550 Severe persistent asthma, uncomplicated: Secondary | ICD-10-CM | POA: Insufficient documentation

## 2022-05-28 ENCOUNTER — Telehealth: Payer: Self-pay | Admitting: *Deleted

## 2022-05-28 NOTE — Telephone Encounter (Signed)
-----   Message from Valentina Shaggy, MD sent at 05/27/2022  8:19 AM EDT ----- Marykay Lex there - Dabria is interested in getting back on Nucala.

## 2022-05-28 NOTE — Telephone Encounter (Signed)
Spoke to Dr Ernst Bowler that we do not do buy and bill Nucala due to loss in Ins payments but can do Tezspire. He will discuss with Webb Silversmith and let me know next step

## 2022-06-01 NOTE — Telephone Encounter (Signed)
Per provider: Can someone call the patient to let her know that Nucala is no longer an option for her due to insurance coverage? We could change to Tezspire if she feels that the [place] Challenger is not working effectively. Thanks!   Sending to [place] as an Financial planner.   Called patient  - DOB verified - stated she would like to see if her insurance will cover the Tezspire and how often will she have to take the Tezspire injection?    Patient stated she wants to think about this and will let Dr. Dellis Anes know if she wants to switch.  Forwarding message to provider and Tammy.

## 2022-06-01 NOTE — Telephone Encounter (Signed)
-----   Message from Alfonse Spruce, MD sent at 05/31/2022  7:38 AM EDT ----- Can someone call the patient to let her know that Nucala is no longer an option for her due to insurance coverage? We could change to Tezspire if she feels that the [place] Challenger is not working effectively. Thanks!   Sending to [place] as an Financial planner.   Malachi Bonds, MD [facility]

## 2022-06-02 NOTE — Telephone Encounter (Signed)
Alicia Hall is monthly. It is a newer drug, but we have had a lot of success with it.   Alicia Bonds, MD [facility]

## 2022-06-12 NOTE — Telephone Encounter (Signed)
L/m for patient to contact me to discuss Tezspire

## 2022-06-22 MED ORDER — BREZTRI AEROSPHERE 160-9-4.8 MCG/ACT IN AERO: 2.0000 | INHALATION_SPRAY | Freq: Two times a day (BID) | RESPIRATORY_TRACT | 3 refills | Status: DC

## 2022-06-22 NOTE — Telephone Encounter (Signed)
Spoke to patient and advised she would think about it and let me know

## 2022-06-22 NOTE — Telephone Encounter (Signed)
L/m for patient again to contact me ?

## 2022-06-22 NOTE — Addendum Note (Signed)
Addended by: Devoria Glassing on: 06/22/2022 12:06 PM   Modules accepted: Orders

## 2022-06-22 NOTE — Telephone Encounter (Signed)
After lengthy discussion with patient she wants to start Tezspire. She will start at her appt on 5/10 with Dellis Anes

## 2022-06-23 NOTE — Telephone Encounter (Signed)
Noted thanks °

## 2022-07-03 ENCOUNTER — Other Ambulatory Visit: Payer: Self-pay

## 2022-07-03 ENCOUNTER — Ambulatory Visit (INDEPENDENT_AMBULATORY_CARE_PROVIDER_SITE_OTHER): Payer: Medicare Other | Admitting: Allergy & Immunology

## 2022-07-03 ENCOUNTER — Encounter: Payer: Self-pay | Admitting: Allergy & Immunology

## 2022-07-03 VITALS — BP 138/68 | HR 85 | Temp 98.3°F | Resp 18 | Ht 62.5 in | Wt 198.8 lb

## 2022-07-03 DIAGNOSIS — J3089 Other allergic rhinitis: Secondary | ICD-10-CM | POA: Diagnosis not present

## 2022-07-03 DIAGNOSIS — J4550 Severe persistent asthma, uncomplicated: Secondary | ICD-10-CM | POA: Diagnosis not present

## 2022-07-03 DIAGNOSIS — K219 Gastro-esophageal reflux disease without esophagitis: Secondary | ICD-10-CM | POA: Diagnosis not present

## 2022-07-03 MED ORDER — ALBUTEROL SULFATE HFA 108 (90 BASE) MCG/ACT IN AERS: 2.0000 | INHALATION_SPRAY | Freq: Four times a day (QID) | RESPIRATORY_TRACT | 2 refills | Status: DC | PRN

## 2022-07-03 MED ORDER — FAMOTIDINE 40 MG PO TABS: 40.0000 mg | ORAL_TABLET | Freq: Two times a day (BID) | ORAL | 1 refills | Status: DC

## 2022-07-03 MED ORDER — MONTELUKAST SODIUM 10 MG PO TABS: 10.0000 mg | ORAL_TABLET | Freq: Every day | ORAL | 1 refills | Status: DC

## 2022-07-03 MED ORDER — BREZTRI AEROSPHERE 160-9-4.8 MCG/ACT IN AERO: 2.0000 | INHALATION_SPRAY | Freq: Two times a day (BID) | RESPIRATORY_TRACT | 3 refills | Status: DC

## 2022-07-03 MED ORDER — TEZEPELUMAB-EKKO 210 MG/1.91ML ~~LOC~~ SOSY
210.0000 mg | PREFILLED_SYRINGE | SUBCUTANEOUS | Status: AC
  Administered 2022-07-03 – 2024-03-08 (×22): 210 mg via SUBCUTANEOUS

## 2022-07-03 NOTE — Progress Notes (Signed)
Immunotherapy   Patient Details  Name: Alicia Hall MRN: [id number] Date of Birth: 07/04/1955  07/03/2022  [name] started injections for  Tezspire buy and bill.  Following schedule: Every twenty eight days.  Frequency:Every four weeks.  Epi-Pen:Not needed Consent signed in office today and patient instructions given. Patient sat in room two for thirty minutes without an issue.    [name] 07/03/2022, 2:09 PM

## 2022-07-03 NOTE — Patient Instructions (Addendum)
1. Asthma-COPD overlap syndrome - Lung function looks good today!  - You tolerated your Tezspire without a problem!  - Daily controller medication(s): Singulair 10mg  daily + Breztri two puffs 1-2 times daily with spacer +  Tezspire every four weeks - Prior to physical activity: albuterol 2 puffs 10-15 minutes before physical activity. - Rescue medications: albuterol 4 puffs every 4-6 hours as needed or albuterol nebulizer one vial every 4-6 hours as needed - Changes during respiratory infections or worsening symptoms: Add on Asmanex to 2 puffs twice daily for ONE TO TWO WEEKS. - Asthma control goals:  * Full participation in all desired activities (may need albuterol before activity) * Albuterol use two time or less a week on average (not counting use with activity) * Cough interfering with sleep two time or less a month * Oral steroids no more than once a year. * No hospitalizations  2. Seasonal and perennial allergic rhinitis - Continue with Nasacort 1-2 sprays per nostril up to twice daily as needed.  - Continue with cetirizine 10mg  daily. - Continue with montelukast (Singulair) 10mg  daily.   3. Return in about 6 months (around 01/03/2023). WE LOVE YA!    Please inform us of any Emergency Department visits, hospitalizations, or changes in symptoms. Call us before going to the ED for breathing or allergy symptoms since we might be able to fit you in for a sick visit. Feel free to contact us anytime with any questions, problems, or concerns. 2 It was a pleasure to see you again today!  Websites that have reliable patient information: 1. American Academy of Asthma, Allergy, and Immunology: www.aaaai.org 2. Food Allergy Research and Education (FARE): foodallergy.org 3. Mothers of Asthmatics: http://www.asthmacommunitynetwork.org 4. American College of Allergy, Asthma, and Immunology: www.acaai.org   COVID-19 Vaccine Information can be found at:  PodExchange.nl For questions related to vaccine distribution or appointments, please email [email or web address] or call [phone].     "Like" [place] on Facebook and Instagram for our latest updates!       Make sure you are registered to vote! If you have moved or changed any of your contact information, you will need to get this updated before voting!  In some cases, you MAY be able to register to vote online: [email or web address]

## 2022-07-03 NOTE — Progress Notes (Signed)
FOLLOW UP  Date of Service/Encounter:  07/03/22   Assessment:   Asthma-COPD overlap syndrome - with eosinophilic phenotype transitioning to Tezspire   Seasonal and perennial allergic rhinitis (grasses, weeds, trees, molds, dust mites, cat, horse, and cockroach)   GERD - under good control with famotidine  Pulmonary nodules - followed by Dr. Marchelle Gearing (last chest CT October 2018 stable)   DCIS - s/p radiation and resection with 5 years of hormonal therapy  Adverse reaction to PPIs (lower extremity pain)  Plan/Recommendations:   1. Asthma-COPD overlap syndrome - Lung function looks good today!  - You tolerated your Tezspire without a problem!  - Daily controller medication(s): Singulair 10mg  daily + Breztri two puffs 1-2 times daily with spacer +  Tezspire every four weeks - Prior to physical activity: albuterol 2 puffs 10-15 minutes before physical activity. - Rescue medications: albuterol 4 puffs every 4-6 hours as needed or albuterol nebulizer one vial every 4-6 hours as needed - Changes during respiratory infections or worsening symptoms: Add on Asmanex to 2 puffs twice daily for ONE TO TWO WEEKS. - Asthma control goals:  * Full participation in all desired activities (may need albuterol before activity) * Albuterol use two time or less a week on average (not counting use with activity) * Cough interfering with sleep two time or less a month * Oral steroids no more than once a year. * No hospitalizations  2. Seasonal and perennial allergic rhinitis - Continue with Nasacort 1-2 sprays per nostril up to twice daily as needed.  - Continue with cetirizine 10mg  daily. - Continue with montelukast (Singulair) 10mg  daily.   3. Return in about 6 months (around 01/03/2023). WE LOVE YA!     Subjective:   Alicia Hall is a 67 y.o. female presenting today for follow up of  Chief Complaint  Patient presents with   Asthma    Changing to [place] D  Alicia Hall has a history of the following: Patient Active Problem List   Diagnosis Date Noted   Severe persistent asthma without complication 05/26/2022   Malignant neoplasm of upper-outer quadrant of left breast in female, estrogen receptor positive (HCC) 09/27/2019   Pulmonary nodules 09/22/2016   Asthma-COPD overlap syndrome 07/28/2016   COPD, moderate (HCC) 07/05/2015   Hypersomnia with sleep apnea 06/19/2015   OSA (obstructive sleep apnea) 06/19/2015   Depression 06/19/2015   Insomnia 06/19/2015   Lung nodule 03/08/2015   History of colonic polyps    GERD (gastroesophageal reflux disease) 01/25/2015   Dysphagia 01/25/2015   Encounter for screening colonoscopy 01/25/2015   COPD (chronic obstructive pulmonary disease) (HCC) 12/18/2014   Moderate persistent asthma 11/23/2014   Dyspnea and respiratory abnormality 11/23/2014   Smoking history 11/23/2014   Upper airway cough syndrome 11/23/2014   Perennial allergic rhinitis 11/15/2014    History obtained from: chart review and patient.  Alicia Hall is a 67 y.o. female presenting for a follow up visit.  She was last seen by our nurse practitioner in April 2024.  At that time, we continue with Breztri 2 puffs twice daily as well as albuterol as needed.  She was interested in getting Nucala back on board.  She felt that the [place] had worked better than the [employer].  She tended to have a decreasing control around 2 weeks before the next [place] Ch[employer] was due.  For her allergic rhinitis, we continue with Nasacort as well as cetirizine.  She has a longstanding history of pulmonary nodules and  was followed by Dr. Marchelle Gearing.  For her reflux, she was started on Prevacid 20 mg daily in addition to her Pepcid 20 mg twice daily. In the interim, she was approved for [employer].  Nucala was not on her formulary.   Since the last visit, she has done very well.  She is excited to start this new injection.  Asthma/Respiratory Symptom History: She remains on her Breztri  2 puffs twice daily.  This seems to be controlling her symptoms adequately.  She has not been on prednisone.  She denies any shortness of breath or wheezing.  She is sleeping well at night.  She is not using her rescue inhaler much at all.  Allergic Rhinitis Symptom History: She remains on the Nasacort as well as the cetirizine.  She also is on the montelukast.  She does not have any sinus infection symptoms but has not been on the antibiotic for several months.  GERD Symptom History: Her legs started hurting from the omeprazole. She remembers that this happened with the omeprazole as well.   She has stayed busy.  She did go out to breakfast with her 2 daughters today.  She does have some other Mother's Day plans this weekend.  Otherwise, there have been no changes to her past medical history, surgical history, family history, or social history.    Review of Systems  Constitutional: Negative.  Negative for chills, fever, malaise/fatigue and weight loss.  HENT: Negative.  Negative for congestion, ear discharge and ear pain.   Eyes:  Negative for pain, discharge and redness.  Respiratory:  Negative for cough, sputum production and wheezing.   Cardiovascular: Negative.  Negative for chest pain and palpitations.  Gastrointestinal:  Negative for abdominal pain, constipation, diarrhea, heartburn, nausea and vomiting.  Skin: Negative.  Negative for itching and rash.  Neurological:  Negative for dizziness and headaches.  Endo/Heme/Allergies:  Positive for environmental allergies. Does not bruise/bleed easily.       Objective:   Blood pressure 138/68, pulse 85, temperature 98.3 F (36.8 C), temperature source Temporal, resp. rate 18, height 5' 2.5" (1.588 m), weight 198 lb 12.8 oz (90.2 kg), SpO2 94 %. Body mass index is 35.78 kg/m.    Physical Exam Vitals reviewed.  Constitutional:      General: She is awake.     Appearance: She is well-developed.     Comments: Talkative and  delightful.   HENT:     Head: Normocephalic and atraumatic.     Right Ear: Tympanic membrane, ear canal and external ear normal.     Left Ear: Tympanic membrane, ear canal and external ear normal.     Nose: No nasal deformity, septal deviation, mucosal edema or rhinorrhea.     Right Turbinates: Enlarged, swollen and pale.     Left Turbinates: Enlarged, swollen and pale.     Right Sinus: No maxillary sinus tenderness or frontal sinus tenderness.     Left Sinus: No maxillary sinus tenderness or frontal sinus tenderness.     Comments: No nasal polyps.    Mouth/Throat:     Mouth: Mucous membranes are not pale and not dry.     Pharynx: Uvula midline.  Eyes:     General: Lids are normal. No allergic shiner.       Right eye: No discharge.        Left eye: No discharge.     Conjunctiva/sclera: Conjunctivae normal.     Right eye: Right conjunctiva is not injected. No chemosis.  Left eye: Left conjunctiva is not injected. No chemosis.    Pupils: Pupils are equal, round, and reactive to light.  Cardiovascular:     Rate and Rhythm: Normal rate and regular rhythm.     Heart sounds: Normal heart sounds.  Pulmonary:     Effort: Pulmonary effort is normal. No tachypnea, accessory muscle usage or respiratory distress.     Breath sounds: Normal breath sounds. No wheezing, rhonchi or rales.     Comments: Moving air well in all lung fields.  No increased work of breathing. Chest:     Chest wall: No tenderness.  Lymphadenopathy:     Cervical: No cervical adenopathy.  Skin:    General: Skin is warm.     Capillary Refill: Capillary refill takes less than 2 seconds.     Coloration: Skin is not pale.     Findings: No abrasion, erythema, petechiae or rash. Rash is not papular, urticarial or vesicular.     Comments: No eczematous or urticarial lesions noted.  Neurological:     Mental Status: She is alert.  Psychiatric:        Behavior: Behavior is cooperative.      Diagnostic studies:   none  Patient did receive her first dose of Tezspire.  She was monitored for 30 minutes and did well.      Malachi Bonds, MD  [facility]

## 2022-07-22 ENCOUNTER — Ambulatory Visit: Payer: BLUE CROSS/BLUE SHIELD

## 2022-07-27 ENCOUNTER — Ambulatory Visit: Payer: BLUE CROSS/BLUE SHIELD

## 2022-07-31 ENCOUNTER — Ambulatory Visit (INDEPENDENT_AMBULATORY_CARE_PROVIDER_SITE_OTHER): Payer: Medicare Other

## 2022-07-31 DIAGNOSIS — J4550 Severe persistent asthma, uncomplicated: Secondary | ICD-10-CM

## 2022-08-31 ENCOUNTER — Ambulatory Visit: Payer: Medicare Other

## 2022-08-31 DIAGNOSIS — J4550 Severe persistent asthma, uncomplicated: Secondary | ICD-10-CM | POA: Diagnosis not present

## 2022-09-30 ENCOUNTER — Ambulatory Visit (INDEPENDENT_AMBULATORY_CARE_PROVIDER_SITE_OTHER): Payer: Medicare Other

## 2022-09-30 DIAGNOSIS — J4550 Severe persistent asthma, uncomplicated: Secondary | ICD-10-CM

## 2022-10-28 ENCOUNTER — Ambulatory Visit: Payer: Medicare Other

## 2022-10-30 ENCOUNTER — Ambulatory Visit (INDEPENDENT_AMBULATORY_CARE_PROVIDER_SITE_OTHER): Payer: Medicare Other

## 2022-10-30 DIAGNOSIS — J4550 Severe persistent asthma, uncomplicated: Secondary | ICD-10-CM

## 2022-11-04 ENCOUNTER — Telehealth: Payer: Self-pay | Admitting: Allergy & Immunology

## 2022-11-04 NOTE — Telephone Encounter (Signed)
Patient call the [place] office to inform us that a nurse called and informed her that the text she got was about the [facility] renewal as documented below.

## 2022-11-04 NOTE — Telephone Encounter (Signed)
Form has been signed by Dr.Gallagher and has been placed in the [place] folder under the S section. Forms will be faxed once we get patient's portion.

## 2022-11-04 NOTE — Telephone Encounter (Signed)
Also faxed gallaghers rx form to [place] for him to sign and once we get the other form back from pt will need to fax to [employer]

## 2022-11-04 NOTE — Telephone Encounter (Signed)
Informed pt the renewal is so she can keep getting the breztri thru the az [employer] me program I printed out the forms and will mail them to pt so she can get them back to [place] to fax to [employer]

## 2022-11-04 NOTE — Telephone Encounter (Signed)
Patient called stating she received a message from [employer] about renewing her Breztri. Patient states she does not know what the message is about.

## 2022-11-18 MED ORDER — BREZTRI AEROSPHERE 160-9-4.8 MCG/ACT IN AERO: 2.0000 | INHALATION_SPRAY | Freq: Two times a day (BID) | RESPIRATORY_TRACT | 3 refills | Status: AC

## 2022-11-18 NOTE — Addendum Note (Signed)
Addended by: Robet Leu A on: 11/18/2022 02:49 PM   Modules accepted: Orders

## 2022-11-18 NOTE — Telephone Encounter (Signed)
Patient dropped off [employer] Application. Application has been reviewed and faxed to [employer] to be processed through their company. Patient was also mailed a copy for her records as well.

## 2022-11-27 ENCOUNTER — Ambulatory Visit: Payer: Medicare Other

## 2022-11-27 DIAGNOSIS — J4550 Severe persistent asthma, uncomplicated: Secondary | ICD-10-CM

## 2022-12-25 ENCOUNTER — Ambulatory Visit: Payer: Medicare Other

## 2022-12-25 DIAGNOSIS — J4550 Severe persistent asthma, uncomplicated: Secondary | ICD-10-CM | POA: Diagnosis not present

## 2023-01-15 ENCOUNTER — Ambulatory Visit: Payer: Medicare Other | Admitting: Allergy & Immunology

## 2023-01-25 ENCOUNTER — Ambulatory Visit: Payer: Medicare Other

## 2023-01-25 DIAGNOSIS — J4550 Severe persistent asthma, uncomplicated: Secondary | ICD-10-CM | POA: Diagnosis not present

## 2023-02-22 ENCOUNTER — Ambulatory Visit (INDEPENDENT_AMBULATORY_CARE_PROVIDER_SITE_OTHER): Payer: Medicare Other

## 2023-02-22 DIAGNOSIS — J4550 Severe persistent asthma, uncomplicated: Secondary | ICD-10-CM

## 2023-03-22 ENCOUNTER — Ambulatory Visit (INDEPENDENT_AMBULATORY_CARE_PROVIDER_SITE_OTHER): Payer: Medicare Other

## 2023-03-22 DIAGNOSIS — J4550 Severe persistent asthma, uncomplicated: Secondary | ICD-10-CM

## 2023-04-17 ENCOUNTER — Other Ambulatory Visit: Payer: Self-pay | Admitting: Allergy & Immunology

## 2023-04-21 ENCOUNTER — Encounter: Payer: Self-pay | Admitting: Allergy & Immunology

## 2023-04-21 ENCOUNTER — Ambulatory Visit: Payer: Medicare Other | Admitting: Allergy & Immunology

## 2023-04-21 ENCOUNTER — Ambulatory Visit: Payer: Medicare Other

## 2023-04-21 VITALS — BP 166/66 | HR 85 | Temp 98.4°F | Resp 16 | Wt 195.1 lb

## 2023-04-21 DIAGNOSIS — J3089 Other allergic rhinitis: Secondary | ICD-10-CM

## 2023-04-21 DIAGNOSIS — K219 Gastro-esophageal reflux disease without esophagitis: Secondary | ICD-10-CM

## 2023-04-21 DIAGNOSIS — J4550 Severe persistent asthma, uncomplicated: Secondary | ICD-10-CM

## 2023-04-21 NOTE — Patient Instructions (Addendum)
 1. Asthma-COPD overlap syndrome - Lung testing looks stable today.  - We are not going to make any changes. - Daily controller medication(s): Singulair 10mg  daily + Breztri two puffs 1-2 times daily with spacer +  Tezspire every four weeks - Prior to physical activity: albuterol 2 puffs 10-15 minutes before physical activity. - Rescue medications: albuterol 4 puffs every 4-6 hours as needed or albuterol nebulizer one vial every 4-6 hours as needed - Changes during respiratory infections or worsening symptoms: Add on Asmanex to 2 puffs twice daily for ONE TO TWO WEEKS. - Asthma control goals:  * Full participation in all desired activities (may need albuterol before activity) * Albuterol use two time or less a week on average (not counting use with activity) * Cough interfering with sleep two time or less a month * Oral steroids no more than once a year. * No hospitalizations  2. Seasonal and perennial allergic rhinitis - Continue with Nasacort 1-2 sprays per nostril up to twice daily as needed.  - Continue with cetirizine 10mg  daily. - Continue with montelukast (Singulair) 10mg  daily.   3. Return in about 6 months (around 10/19/2023). You can have the follow up appointment with Dr. Dellis Anes or a Nurse Practicioner (our Nurse Practitioners are excellent and always have Physician oversight!).    Please inform us of any Emergency Department visits, hospitalizations, or changes in symptoms. Call us before going to the ED for breathing or allergy symptoms since we might be able to fit you in for a sick visit. Feel free to contact us anytime with any questions, problems, or concerns.  It was a pleasure to see you again today!  Websites that have reliable patient information: 1. American Academy of Asthma, Allergy, and Immunology: www.aaaai.org 2. Food Allergy Research and Education (FARE): foodallergy.org 3. Mothers of Asthmatics: http://www.asthmacommunitynetwork.org 4. American  College of Allergy, Asthma, and Immunology: www.acaai.org      "Like" [place] on Facebook and Instagram for our latest updates!      A healthy democracy works best when [employer] participate! Make sure you are registered to vote! If you have moved or changed any of your contact information, you will need to get this updated before voting! Scan the QR codes below to learn more!

## 2023-04-21 NOTE — Progress Notes (Signed)
 FOLLOW UP  Date of Service/Encounter:  04/21/23   Assessment:   Asthma-COPD overlap syndrome - with eosinophilic phenotype and doing somewhat well on Tezspire   Seasonal and perennial allergic rhinitis (grasses, weeds, trees, molds, dust mites, cat, horse, and cockroach)   GERD - under good control with famotidine   Pulmonary nodules - followed by Dr. Marchelle Gearing (last chest CT October 2018 stable)   DCIS - s/p radiation and resection with 5 years of hormonal therapy   Adverse reaction to PPIs (lower extremity pain)  Plan/Recommendations:   1. Asthma-COPD overlap syndrome - Lung testing looks stable today.  - We are not going to make any changes. - Daily controller medication(s): Singulair 10mg  daily + Breztri two puffs 1-2 times daily with spacer +  Tezspire every four weeks - Prior to physical activity: albuterol 2 puffs 10-15 minutes before physical activity. - Rescue medications: albuterol 4 puffs every 4-6 hours as needed or albuterol nebulizer one vial every 4-6 hours as needed - Changes during respiratory infections or worsening symptoms: Add on Asmanex to 2 puffs twice daily for ONE TO TWO WEEKS. - Asthma control goals:  * Full participation in all desired activities (may need albuterol before activity) * Albuterol use two time or less a week on average (not counting use with activity) * Cough interfering with sleep two time or less a month * Oral steroids no more than once a year. * No hospitalizations  2. Seasonal and perennial allergic rhinitis - Continue with Nasacort 1-2 sprays per nostril up to twice daily as needed.  - Continue with cetirizine 10mg  daily. - Continue with montelukast (Singulair) 10mg  daily.   3. Return in about 6 months (around 10/19/2023). You can have the follow up appointment with Dr. Dellis Anes or a Nurse Practicioner (our Nurse Practitioners are excellent and always have Physician oversight!).    Subjective:   Alicia Hall is  a 68 y.o. female presenting today for follow up of  Chief Complaint  Patient presents with   Follow-up    Congestion     Alicia Hall has a history of the following: Patient Active Problem List   Diagnosis Date Noted   Severe persistent asthma without complication 05/26/2022   Malignant neoplasm of upper-outer quadrant of left breast in female, estrogen receptor positive (HCC) 09/27/2019   Pulmonary nodules 09/22/2016   Asthma-COPD overlap syndrome (HCC) 07/28/2016   COPD, moderate (HCC) 07/05/2015   Hypersomnia with sleep apnea 06/19/2015   OSA (obstructive sleep apnea) 06/19/2015   Depression 06/19/2015   Insomnia 06/19/2015   Lung nodule 03/08/2015   History of colonic polyps    GERD (gastroesophageal reflux disease) 01/25/2015   Dysphagia 01/25/2015   Encounter for screening colonoscopy 01/25/2015   COPD (chronic obstructive pulmonary disease) (HCC) 12/18/2014   Moderate persistent asthma 11/23/2014   Dyspnea and respiratory abnormality 11/23/2014   Smoking history 11/23/2014   Upper airway cough syndrome 11/23/2014   Perennial allergic rhinitis 11/15/2014    History obtained from: chart review and patient.  Discussed the use of AI scribe software for clinical note transcription with the patient and/or guardian, who gave verbal consent to proceed.  Alicia Hall is a 68 y.o. female presenting for a follow up visit.  She was last seen in May 2024.  At that time, her lung function looked good.  We did start her on Tezspire at that time.  We continue with Singulair as well as Breztri 2 puffs 1-2 times daily.  For her  allergic rhinitis, she continue with Nasacort as well as Zyrtec and Singulair.  Since last visit, she has largely done well.  Asthma/Respiratory Symptom History: She remains on her Singulair 10 mg daily.  She is using her Breztri 2 puffs at least once a day, mostly twice a day.  Her injections of Tezspire seem to be working pretty well. She does want to go back to the  Nucala. She feels that she was able to do more activities of daily living with the use of the Nucala compared to the [place]. She would like to go back to it if possible. She felt her best when she was on the Nucala.   Allergic Rhinitis Symptom History: She remains on the Nasacort one to two sprays per nostril daily. She also remains on the cetirizine and the montelukast. She has not been on antibiotics lately for any sinus or ear infections.   She is recovered from DCIS. She follows up with Heme/Onc every 6 months and at her last visit, it was felt that she had no findings consistent with carcinoma recurrence.   Otherwise, there have been no changes to her past medical history, surgical history, family history, or social history.    Review of systems otherwise negative other than that mentioned in the HPI.    Objective:   Blood pressure (!) 166/66, pulse 85, temperature 98.4 F (36.9 C), resp. rate 16, weight 195 lb 2 oz (88.5 kg), SpO2 93%. Body mass index is 35.12 kg/m.    Physical Exam Vitals reviewed.  Constitutional:      General: She is awake.     Appearance: She is well-developed.     Comments: Talkative and delightful. Lovely as always.   HENT:     Head: Normocephalic and atraumatic.     Right Ear: Tympanic membrane, ear canal and external ear normal.     Left Ear: Tympanic membrane, ear canal and external ear normal.     Nose: No nasal deformity, septal deviation, mucosal edema or rhinorrhea.     Right Turbinates: Enlarged, swollen and pale.     Left Turbinates: Enlarged, swollen and pale.     Right Sinus: No maxillary sinus tenderness or frontal sinus tenderness.     Left Sinus: No maxillary sinus tenderness or frontal sinus tenderness.     Comments: No nasal polyps.    Mouth/Throat:     Mouth: Mucous membranes are not pale and not dry.     Pharynx: Uvula midline.  Eyes:     General: Lids are normal. No allergic shiner.       Right eye: No discharge.         Left eye: No discharge.     Conjunctiva/sclera: Conjunctivae normal.     Right eye: Right conjunctiva is not injected. No chemosis.    Left eye: Left conjunctiva is not injected. No chemosis.    Pupils: Pupils are equal, round, and reactive to light.  Cardiovascular:     Rate and Rhythm: Normal rate and regular rhythm.     Heart sounds: Normal heart sounds.  Pulmonary:     Effort: Pulmonary effort is normal. No tachypnea, accessory muscle usage or respiratory distress.     Breath sounds: Normal breath sounds. No wheezing, rhonchi or rales.     Comments: Moving air well in all lung fields.  No increased work of breathing. Chest:     Chest wall: No tenderness.  Lymphadenopathy:     Cervical: No cervical adenopathy.  Skin:  General: Skin is warm.     Capillary Refill: Capillary refill takes less than 2 seconds.     Coloration: Skin is not pale.     Findings: No abrasion, erythema, petechiae or rash. Rash is not papular, urticarial or vesicular.     Comments: No eczematous or urticarial lesions noted.  Neurological:     Mental Status: She is alert.  Psychiatric:        Behavior: Behavior is cooperative.      Diagnostic studies:    Spirometry: results abnormal (FEV1: 1.30/63%, FVC: 1.88/70%, FEV1/FVC: 69%).    Spirometry consistent with mild obstructive disease.   Allergy Studies: none        Malachi Bonds, MD  [facility]

## 2023-04-24 ENCOUNTER — Encounter: Payer: Self-pay | Admitting: Allergy & Immunology

## 2023-04-26 ENCOUNTER — Telehealth: Payer: Self-pay | Admitting: *Deleted

## 2023-04-26 NOTE — Telephone Encounter (Signed)
-----   Message from Alfonse Spruce sent at 04/24/2023  7:30 AM EST ----- Patient really wants to go back to [place].Marland KitchenMarland Kitchen

## 2023-04-26 NOTE — Telephone Encounter (Signed)
 L/m for patient to contact me to discuss Nucala. The only way to get same would to be pay out of pocket copay for part D coverage of medication. Unfortunately we are unable to do buy and bill for same due to reimbursement doesn't cover out cost of the medication

## 2023-05-14 NOTE — Telephone Encounter (Signed)
 Called patient and advised we are unable to do buy and bill Nucala due to losing money due to [facility] and supplement reimbursement. She is ok with staying on [employer]

## 2023-05-14 NOTE — Telephone Encounter (Signed)
L/m for patient again to contact me ?

## 2023-05-17 ENCOUNTER — Ambulatory Visit: Payer: Medicare Other

## 2023-05-18 NOTE — Telephone Encounter (Signed)
 Ok sounds good. Thanks Tam Tam!   Malachi Bonds, MD [facility]

## 2023-05-19 ENCOUNTER — Ambulatory Visit: Payer: Medicare Other

## 2023-05-19 DIAGNOSIS — J4550 Severe persistent asthma, uncomplicated: Secondary | ICD-10-CM | POA: Diagnosis not present

## 2023-06-16 ENCOUNTER — Ambulatory Visit

## 2023-06-16 DIAGNOSIS — J4550 Severe persistent asthma, uncomplicated: Secondary | ICD-10-CM | POA: Diagnosis not present

## 2023-07-14 ENCOUNTER — Ambulatory Visit (INDEPENDENT_AMBULATORY_CARE_PROVIDER_SITE_OTHER)

## 2023-07-14 DIAGNOSIS — J4550 Severe persistent asthma, uncomplicated: Secondary | ICD-10-CM

## 2023-08-11 ENCOUNTER — Ambulatory Visit (INDEPENDENT_AMBULATORY_CARE_PROVIDER_SITE_OTHER)

## 2023-08-11 DIAGNOSIS — J4550 Severe persistent asthma, uncomplicated: Secondary | ICD-10-CM

## 2023-08-13 ENCOUNTER — Telehealth: Payer: Self-pay | Admitting: Allergy & Immunology

## 2023-08-13 MED ORDER — MONTELUKAST SODIUM 10 MG PO TABS: 10.0000 mg | ORAL_TABLET | Freq: Every day | ORAL | 0 refills | Status: DC

## 2023-08-13 NOTE — Telephone Encounter (Signed)
 Alicia Hall called and stated that she needs refills of her Montelukast  (Singulair ) 10mg 

## 2023-08-13 NOTE — Telephone Encounter (Signed)
 Called and informed patient of the refill approval. Patient verbalized understanding.

## 2023-08-18 ENCOUNTER — Other Ambulatory Visit: Payer: Self-pay | Admitting: Allergy & Immunology

## 2023-09-08 ENCOUNTER — Ambulatory Visit

## 2023-09-08 DIAGNOSIS — J4550 Severe persistent asthma, uncomplicated: Secondary | ICD-10-CM | POA: Diagnosis not present

## 2023-09-08 DIAGNOSIS — J309 Allergic rhinitis, unspecified: Secondary | ICD-10-CM

## 2023-10-20 ENCOUNTER — Ambulatory Visit (INDEPENDENT_AMBULATORY_CARE_PROVIDER_SITE_OTHER): Payer: Medicare Other | Admitting: Allergy & Immunology

## 2023-10-20 ENCOUNTER — Encounter: Payer: Self-pay | Admitting: Allergy & Immunology

## 2023-10-20 ENCOUNTER — Other Ambulatory Visit: Payer: Self-pay

## 2023-10-20 ENCOUNTER — Ambulatory Visit

## 2023-10-20 VITALS — BP 122/82 | HR 86 | Temp 98.1°F | Resp 19 | Ht 60.24 in | Wt 188.4 lb

## 2023-10-20 DIAGNOSIS — K219 Gastro-esophageal reflux disease without esophagitis: Secondary | ICD-10-CM

## 2023-10-20 DIAGNOSIS — J4550 Severe persistent asthma, uncomplicated: Secondary | ICD-10-CM | POA: Diagnosis not present

## 2023-10-20 DIAGNOSIS — J3089 Other allergic rhinitis: Secondary | ICD-10-CM

## 2023-10-20 MED ORDER — MONTELUKAST SODIUM 10 MG PO TABS: 10.0000 mg | ORAL_TABLET | Freq: Every day | ORAL | 1 refills | Status: AC

## 2023-10-20 MED ORDER — TRIAMCINOLONE ACETONIDE 0.1 % EX CREA: 1.0000 | TOPICAL_CREAM | Freq: Two times a day (BID) | CUTANEOUS | 1 refills | Status: AC

## 2023-10-20 MED ORDER — ALBUTEROL SULFATE HFA 108 (90 BASE) MCG/ACT IN AERS: 2.0000 | INHALATION_SPRAY | Freq: Four times a day (QID) | RESPIRATORY_TRACT | 2 refills | Status: AC | PRN

## 2023-10-20 MED ORDER — FLUTICASONE PROPIONATE 50 MCG/ACT NA SUSP: 2.0000 | Freq: Every day | NASAL | 5 refills | Status: AC

## 2023-10-20 NOTE — Progress Notes (Unsigned)
 FOLLOW UP  Date of Service/Encounter:  10/20/23   Assessment:   Asthma-COPD overlap syndrome - with eosinophilic phenotype and doing somewhat well on Tezspire    Seasonal and perennial allergic rhinitis (grasses, weeds, trees, molds, dust mites, cat, horse, and cockroach)   GERD - under good control with famotidine    Pulmonary nodules - followed by Dr. Geronimo (last chest CT October 2018 stable)   DCIS - s/p radiation and resection with 5 years of hormonal therapy   Adverse reaction to PPIs (lower extremity pain)  Plan/Recommendations:   There are no Patient Instructions on file for this visit.   Subjective:   Alicia Hall is a 68 y.o. female presenting today for follow up of No chief complaint on file.   Alicia Hall has a history of the following: Patient Active Problem List   Diagnosis Date Noted   Severe persistent asthma without complication 05/26/2022   Malignant neoplasm of upper-outer quadrant of left breast in female, estrogen receptor positive (HCC) 09/27/2019   Pulmonary nodules 09/22/2016   Asthma-COPD overlap syndrome (HCC) 07/28/2016   COPD, moderate (HCC) 07/05/2015   Hypersomnia with sleep apnea 06/19/2015   OSA (obstructive sleep apnea) 06/19/2015   Depression 06/19/2015   Insomnia 06/19/2015   Lung nodule 03/08/2015   History of colonic polyps    GERD (gastroesophageal reflux disease) 01/25/2015   Dysphagia 01/25/2015   Encounter for screening colonoscopy 01/25/2015   COPD (chronic obstructive pulmonary disease) (HCC) 12/18/2014   Moderate persistent asthma 11/23/2014   Dyspnea and respiratory abnormality 11/23/2014   Smoking history 11/23/2014   Upper airway cough syndrome 11/23/2014   Perennial allergic rhinitis 11/15/2014    History obtained from: chart review and {Persons; PED relatives w/patient:19415::patient}.  Discussed the use of AI scribe software for clinical note transcription with the patient and/or guardian, who gave  verbal consent to proceed.  Alicia Hall is a 68 y.o. female presenting for {Blank single:19197::a food challenge,a drug challenge,skin testing,a sick visit,an evaluation of ***,a follow up visit}.  She was last seen in February 2025.  At that time, we continue with Singulair  10 mg daily as well as Breztri  2 puffs 1-2 times daily and Tezspire  every 4 weeks.  For her rhinitis, we continue with Nasacort  as well as cetirizine  and montelukast .  Since last visit, she has done very well.  Asthma/Respiratory Symptom History: ***  Allergic Rhinitis Symptom History: ***  Food Allergy Symptom History: ***  Skin Symptom History: ***  GERD Symptom History: ***  Infection Symptom History: ***  Otherwise, there have been no changes to her past medical history, surgical history, family history, or social history.    Review of systems otherwise negative other than that mentioned in the HPI.    Objective:   Blood pressure 122/82, pulse 86, temperature 98.1 F (36.7 C), temperature source Temporal, resp. rate 19, height 5' 0.24 (1.53 m), weight 188 lb 6.4 oz (85.5 kg), SpO2 95%. Body mass index is 36.51 kg/m.    Physical Exam   Diagnostic studies:    Spirometry: results normal (FEV1: 1.48/72%, FVC: 1.96/74%, FEV1/FVC: 76%).    Spirometry consistent with normal pattern. {Blank single:19197::Albuterol /Atrovent  nebulizer,Xopenex /Atrovent  nebulizer,Albuterol  nebulizer,Albuterol  four puffs via MDI,Xopenex  four puffs via MDI} treatment given in clinic with {Blank single:19197::significant improvement in FEV1 per ATS criteria,significant improvement in FVC per ATS criteria,significant improvement in FEV1 and FVC per ATS criteria,improvement in FEV1, but not significant per ATS criteria,improvement in FVC, but not significant per ATS criteria,improvement in FEV1 and FVC, but not  significant per ATS criteria,no improvement}.  Allergy Studies: {Blank  single:19197::none,deferred due to recent antihistamine use,deferred due to insurance stipulations that require a separate visit for testing,labs sent instead, }    {Blank single:19197::Allergy testing results were read and interpreted by myself, documented by clinical staff., }      Marty Shaggy, MD  [facility] 

## 2023-10-20 NOTE — Patient Instructions (Addendum)
 1. Asthma-COPD overlap syndrome - Lung testing looks much better today.  - We are not going to make any changes. - Daily controller medication(s): Singulair  10mg  daily + Breztri  two puffs 1-2 times daily with spacer +  Tezspire  every four weeks - Prior to physical activity: albuterol  2 puffs 10-15 minutes before physical activity. - Rescue medications: albuterol  4 puffs every 4-6 hours as needed or albuterol  nebulizer one vial every 4-6 hours as needed - Changes during respiratory infections or worsening symptoms: Add on Asmanex  100mcg to 2 puffs twice daily for ONE TO TWO WEEKS. - Asthma control goals:  * Full participation in all desired activities (may need albuterol  before activity) * Albuterol  use two time or less a week on average (not counting use with activity) * Cough interfering with sleep two time or less a month * Oral steroids no more than once a year. * No hospitalizations  2. Seasonal and perennial allergic rhinitis - Continue with Nasacort  1-2 sprays per nostril up to twice daily as needed.  - Continue with cetirizine  10mg  daily. - Continue with montelukast  (Singulair ) 10mg  daily.   3. Return in about 6 months (around 04/21/2024). You can have the follow up appointment with Dr. Iva or a Nurse Practicioner (our Nurse Practitioners are excellent and always have Physician oversight!).    Please inform us  of any Emergency Department visits, hospitalizations, or changes in symptoms. Call us  before going to the ED for breathing or allergy symptoms since we might be able to fit you in for a sick visit. Feel free to contact us  anytime with any questions, problems, or concerns.  It was a pleasure to see you again today!  Websites that have reliable patient information: 1. American Academy of Asthma, Allergy, and Immunology: www.aaaai.org 2. Food Allergy Research and Education (FARE): foodallergy.org 3. Mothers of Asthmatics: http://www.asthmacommunitynetwork.org 4. American  College of Allergy, Asthma, and Immunology: www.acaai.org      "Like" us  on Facebook and Instagram for our latest updates!      A healthy democracy works best when [employer] participate! Make sure you are registered to vote! If you have moved or changed any of your contact information, you will need to get this updated before voting! Scan the QR codes below to learn more!

## 2023-10-21 ENCOUNTER — Encounter: Payer: Self-pay | Admitting: Allergy & Immunology

## 2023-11-17 ENCOUNTER — Ambulatory Visit (INDEPENDENT_AMBULATORY_CARE_PROVIDER_SITE_OTHER)

## 2023-11-17 DIAGNOSIS — J4550 Severe persistent asthma, uncomplicated: Secondary | ICD-10-CM | POA: Diagnosis not present

## 2023-12-15 ENCOUNTER — Ambulatory Visit (INDEPENDENT_AMBULATORY_CARE_PROVIDER_SITE_OTHER)

## 2023-12-15 DIAGNOSIS — J4550 Severe persistent asthma, uncomplicated: Secondary | ICD-10-CM

## 2024-01-12 ENCOUNTER — Ambulatory Visit (INDEPENDENT_AMBULATORY_CARE_PROVIDER_SITE_OTHER)

## 2024-01-12 DIAGNOSIS — J4550 Severe persistent asthma, uncomplicated: Secondary | ICD-10-CM | POA: Diagnosis not present

## 2024-02-09 ENCOUNTER — Ambulatory Visit

## 2024-02-09 DIAGNOSIS — J4550 Severe persistent asthma, uncomplicated: Secondary | ICD-10-CM | POA: Diagnosis not present

## 2024-03-08 ENCOUNTER — Ambulatory Visit

## 2024-03-08 DIAGNOSIS — J4550 Severe persistent asthma, uncomplicated: Secondary | ICD-10-CM | POA: Diagnosis not present

## 2024-03-15 ENCOUNTER — Telehealth: Payer: Self-pay | Admitting: Allergy & Immunology

## 2024-03-15 NOTE — Telephone Encounter (Signed)
 Pt called stating that she needs a refill on her Budeson-Glycopyrrol-Formoterol  (BREZTRI  AEROSPHERE) 160-9-4.8 MCG/ACT AERO [[id number]] and would like it to be delivered to her house

## 2024-03-15 NOTE — Telephone Encounter (Signed)
 I called and spoke with the patient to inform her that she needs to contact the program due to she was approved and we don't handle the shipment. She wanted to inform me that a previous medical assistant handled it for her. Which I informed her that the paperwork was submitted for re approval for the 2026 year. And that she needs to call to confirm her shipment. She verbalized understanding.

## 2024-04-05 ENCOUNTER — Ambulatory Visit

## 2024-05-12 ENCOUNTER — Ambulatory Visit: Admitting: Allergy & Immunology
